# Patient Record
Sex: Female | Born: 1935 | Race: Black or African American | Hispanic: No | Marital: Married | Smoking: Former smoker
Health system: Southern US, Community
[De-identification: ages and names within clinical notes are randomized; demographics above are authoritative.]

## PROBLEM LIST (undated history)

## (undated) DIAGNOSIS — I2510 Atherosclerotic heart disease of native coronary artery without angina pectoris: Secondary | ICD-10-CM

## (undated) DIAGNOSIS — R9430 Abnormal result of cardiovascular function study, unspecified: Secondary | ICD-10-CM

## (undated) DIAGNOSIS — R42 Dizziness and giddiness: Secondary | ICD-10-CM

## (undated) DIAGNOSIS — I350 Nonrheumatic aortic (valve) stenosis: Secondary | ICD-10-CM

## (undated) DIAGNOSIS — Z9229 Personal history of other drug therapy: Secondary | ICD-10-CM

## (undated) DIAGNOSIS — IMO0002 Reserved for concepts with insufficient information to code with codable children: Secondary | ICD-10-CM

## (undated) DIAGNOSIS — M1990 Unspecified osteoarthritis, unspecified site: Secondary | ICD-10-CM

## (undated) DIAGNOSIS — E119 Type 2 diabetes mellitus without complications: Secondary | ICD-10-CM

## (undated) DIAGNOSIS — K219 Gastro-esophageal reflux disease without esophagitis: Secondary | ICD-10-CM

## (undated) DIAGNOSIS — E785 Hyperlipidemia, unspecified: Secondary | ICD-10-CM

## (undated) DIAGNOSIS — R208 Other disturbances of skin sensation: Secondary | ICD-10-CM

## (undated) DIAGNOSIS — I4891 Unspecified atrial fibrillation: Secondary | ICD-10-CM

## (undated) DIAGNOSIS — I10 Essential (primary) hypertension: Secondary | ICD-10-CM

## (undated) DIAGNOSIS — R55 Syncope and collapse: Secondary | ICD-10-CM

## (undated) HISTORY — DX: Abnormal result of cardiovascular function study, unspecified: R94.30

## (undated) HISTORY — PX: APPENDECTOMY: SHX54

## (undated) HISTORY — DX: Gastro-esophageal reflux disease without esophagitis: K21.9

## (undated) HISTORY — DX: Syncope and collapse: R55

## (undated) HISTORY — DX: Hyperlipidemia, unspecified: E78.5

## (undated) HISTORY — PX: EYE SURGERY: SHX253

## (undated) HISTORY — DX: Unspecified atrial fibrillation: I48.91

## (undated) HISTORY — DX: Dizziness and giddiness: R42

## (undated) HISTORY — PX: VAGINAL HYSTERECTOMY: SUR661

## (undated) HISTORY — DX: Personal history of other drug therapy: Z92.29

## (undated) HISTORY — PX: BREAST SURGERY: SHX581

## (undated) HISTORY — PX: SHOULDER SURGERY: SHX246

## (undated) HISTORY — DX: Nonrheumatic aortic (valve) stenosis: I35.0

## (undated) HISTORY — PX: OTHER SURGICAL HISTORY: SHX169

## (undated) HISTORY — DX: Other disturbances of skin sensation: R20.8

## (undated) HISTORY — DX: Reserved for concepts with insufficient information to code with codable children: IMO0002

## (undated) SURGERY — ECHOCARDIOGRAM, TRANSESOPHAGEAL
Anesthesia: Moderate Sedation

---

## 2005-05-02 ENCOUNTER — Ambulatory Visit: Payer: Self-pay | Admitting: Internal Medicine

## 2005-05-09 ENCOUNTER — Ambulatory Visit: Payer: Self-pay | Admitting: Internal Medicine

## 2005-05-09 ENCOUNTER — Ambulatory Visit (HOSPITAL_COMMUNITY): Admission: RE | Admit: 2005-05-09 | Discharge: 2005-05-09 | Payer: Self-pay | Admitting: Internal Medicine

## 2007-03-05 ENCOUNTER — Ambulatory Visit: Payer: Self-pay | Admitting: Cardiology

## 2007-03-08 ENCOUNTER — Inpatient Hospital Stay (HOSPITAL_COMMUNITY): Admission: AD | Admit: 2007-03-08 | Discharge: 2007-03-10 | Payer: Self-pay | Admitting: Internal Medicine

## 2007-03-08 ENCOUNTER — Ambulatory Visit: Payer: Self-pay | Admitting: Internal Medicine

## 2007-03-25 ENCOUNTER — Ambulatory Visit: Payer: Self-pay | Admitting: Cardiology

## 2010-09-19 ENCOUNTER — Ambulatory Visit: Admit: 2010-09-19 | Payer: Self-pay | Admitting: Orthopedic Surgery

## 2011-01-03 ENCOUNTER — Encounter: Payer: Self-pay | Admitting: Orthopedic Surgery

## 2011-01-03 ENCOUNTER — Ambulatory Visit (INDEPENDENT_AMBULATORY_CARE_PROVIDER_SITE_OTHER): Payer: Medicare HMO | Admitting: Orthopedic Surgery

## 2011-01-03 VITALS — HR 64 | Resp 16 | Ht 63.0 in | Wt 161.0 lb

## 2011-01-03 DIAGNOSIS — M179 Osteoarthritis of knee, unspecified: Secondary | ICD-10-CM

## 2011-01-03 DIAGNOSIS — IMO0002 Reserved for concepts with insufficient information to code with codable children: Secondary | ICD-10-CM

## 2011-01-03 DIAGNOSIS — M1710 Unilateral primary osteoarthritis, unspecified knee: Secondary | ICD-10-CM

## 2011-01-03 NOTE — Progress Notes (Signed)
Chief complaint bilateral knee pain, RIGHT greater than LEFT.   77 year old female with history of diabetes with no medication required, glaucoma, and hypertension status post appendectomy, RIGHT rotator cuff repair, bilateral breast surgery, and hysterectomy who presents as a patient of Dr. Marilynn Latino for evaluation of bilateral knee pain, RIGHT greater than LEFT.  She had a LEFT knee arthroscopy in 1996. She was followed in the evening by her orthopedist. LEFT hand. She's had increased pain for the last month associated with popping and stiffness. Previous treatments include injection, no arthritis medication, however.  She has gradually increasing throbbing 6/10 knee pain, occasionally in usually relieved with injection the last one coming in the LEFT knee in December of 2011 in the RIGHT knee and June of 2011.  She wishes to discuss knee replacement surgery.  All systems were reviewed and were negative, except for history of heartburn, and joint pain.  Other medical history includes allergy to cephalexin and sulfa and penicillin. She takes the medications as we have listed.  She has a family history of cancer and diabetes.  She is married. Does not smoke or drink and completed her education through the 11th grade.  Vital signs as reviewed.  General appearance her body habitus is endomorphic. She is oriented x3. Her mood and affect are normal.  She walks unsupported with no obvious length.  Her upper extremities have normal range of motion, stability, and strength. Normal skin. And no abnormalities on inspection.  The lower extremities. Hip range of motion is normal and pain-free. Knee flexion is approximately 120 on the RIGHT and 125 on the LEFT. Muscle tone normal in each. Both knees are stable. Both knees have some degree of medial joint line tenderness and lateral joint line tenderness. It appears to be more lateral than medial. Strength is normal. Skin is normal.  All 4 extremities have  normal pulse, and temperature, but no peripheral edema.  Lymph nodes are normal.  Sensation is normal.  Reflexes are 2+ and normal.  Balance and coordination are normal.  X-rays were changed from the patient. He showed that she has what is really developing valgus osteoarthritis of the knees.  We also reviewed her operative note from 1996. She had a abrasion arthroplasty. The patella and medial femoral condyle. Debridement of all 3 compartments. She had a lateral meniscectomy. Orthopedic notes are also noted backdating to 1992.  Impression bilateral knee osteoarthritis.  The RIGHT is worse than the LEFT.  The plan is for her to see her primary care doctor for medical clearance and then proceed with the RIGHT knee arthroplasty.  We did briefly review the surgery, a knee model, hospital stay. I asked her to read our literature from the [employer] on knee replacement, as well as our. Brochure. I asked her to go to the joint, class.  When she returns we will x-ray her knee 3 views, and then planned for surgical RIGHT knee total replacement.

## 2011-01-03 NOTE — Patient Instructions (Signed)
Get med clearance from Dr. Sherril Croon  After medical clearance give [place] a call and we will get you in for preop visit, will need xrays in our office at that time

## 2011-01-29 NOTE — Cardiovascular Report (Signed)
NAMEROCKIE, VAWTER                  ACCOUNT NO.:  [id number]   MEDICAL RECORD NO.:  [id number]          PATIENT TYPE:  INP   LOCATION:  2023                         FACILITY:  [facility]   PHYSICIAN:  Arturo Morton. Riley Kill, MD, FACCDATE OF BIRTH:  1931/05/04   DATE OF PROCEDURE:  03/09/2007  DATE OF DISCHARGE:                            CARDIAC CATHETERIZATION   INDICATIONS:  Ms. Bing Matter is a 77 year old female who presents with some  recurrent episodes of chest discomfort.  She had an abnormal Myoview.  Her enzymes were negative.  She was brought to the catheterization  laboratory for further evaluation.  Importantly, there is a vague  history possibly of contrast reaction.  She denies that she has ever had  a true contrast reaction.  She has had a soft shell fish, and had a  typical allergic type reaction to this.  However, she has also had  reactions to Solu-Medrol in the past.  As a result, we elected to give  her diphenhydramine as well as famotidine prior to the procedure, but  she lists methylprednisolone as an allergy and we elected to avoid this.  Risks, benefits, alternatives were discussed with the patient in detail.  Of note, the patient was given subcutaneous low level Lovenox  approximately four and a half hours prior to the procedure.  However, it  was in a dose of 0.6 mg/kg.   PROCEDURE:  1. Left the catheterization  2. Selective coronary arteriography.  3. Selective left ventriculography.   The patient was brought to the cath lab and prepped and draped in usual  fashion.  Intravenous famotidine, 50 mg of intravenous Benadryl were  administered prior to the procedure.  The patient did develop some  restless legs, typical of diphenhydramine.  The procedure was performed  for the right femoral artery.  We used only 4-French catheters.  Following the procedure, all catheters were subsequently removed and  hemostasis was achieved by direct hold by me and the technologist that  spanned more than 30 minutes with good hemostasis by 10 minutes.  She  was observed in the laboratory and then taken to the holding area for  continued convalescence.   HEMODYNAMIC DATA:  1. Initial central aortic pressure was 124/59, mean 82.  2. Left ventricular pressure 161/24.  3. There was no gradient on pullback across aortic valve.   ANGIOGRAPHIC DATA:  1. On plain fluoroscopy, there was fairly heavily calcification of the      coronary arteries.  There was also fairly heavy calcification of      the proximal aortic root.  2. Ventriculography in the RAO projection reveals preserved global      systolic function.  3. The left main is long and free of critical disease.  4. The LAD courses to the apex.  After the septal and diagonal is an      area of segmental disease in the mid vessel that is about 30%      luminal reduction.  It does not appear to be high-grade.  5. The circumflex provides mainly a bifurcating marginal branch other  than minor luminal irregularities without critical narrowing.  6. The right coronary artery is somewhat tortuous and provides      posterior descending and smaller posterolateral system.  There is      perhaps 30% tapered narrowing at the ostium but no areas of high-      grade disease.   CONCLUSIONS:  1. Well-preserved left ventricular function.  2. No evidence of high-grade coronary obstruction.   DISPOSITION:  The patient be treated medically.  She is diabetic and  does have advanced age.  Risk factor reduction is recommended.      Arturo Morton. Riley Kill, MD, [facility]  Electronically Signed     TDS/MEDQ  D:  03/09/2007  T:  03/10/2007  Job:  [id number]   cc:   Kirstie Peri, MD  Bevelyn Buckles. Bensimhon, MD  CV Laboratory

## 2011-01-29 NOTE — Assessment & Plan Note (Signed)
[facility]                          [facility] OFFICE NOTE   EUN, VERMEER                         MRN:          [id number]  DATE:03/25/2007                            DOB:          02/05/1931    SUMMARY OF HISTORY:  Ms. Kelli Stone is a 77 year old African-American female  who we initially consulted on during her [facility] admission.  She  underwent adenosine Myoview,  which showed an apical scar and some peri-  infarct ischemia with an EF of 51%.  It was felt that she should have a  cardiac catheterization to further evaluate this, thus she was  transferred to [facility]  At [facility] cardiac catheterization  was performed.  This revealed normal LV function, calcification of the  coronary arteries and of the proximal aortic root, the LAD had a mid 30%  luminal reduction, circumflex some irregularities and a 30% tapered  ostial RCA lesion.  MRI was also performed to evaluate the aortic root  and this did not show any abnormalities, thus she was discharged home.  She is here for followup.   Since her discharge she has not had any further problems with chest  discomfort.  She states that she has developed a slight knot at the  catheterization site and was wondering if this was normal.  She also has  multiple questions about the cardiac catheterization and she states that  no one discussed the results with her.   CURRENT MEDICATIONS:  Her current medications include:  1. Lisinopril 40 mg p.o. daily.  2. Metformin 500 mg p.o. daily.  3. Welchol 625 three tabs b.i.d.  4. Questran 4 mg daily.  5. MiraLax 17 gm daily.  6. Flonase 2 puffs daily.  7. Zetia 10 mg daily.  8. Felodipine 2.5 daily.  9. Nexium 40 daily.  10.Chlorthalidone 25 mg daily.  11.__________ eye drops 0.005% one drop O.U. daily.  12.Timoptic 0.25 one drop O.U. daily.  13.Calcium 600 plus D b.i.d.   She said that she was recently hospitalized overnight at [facility] for gastritis when she choked on taking her Welchol pills and  was discharged home on proton pump inhibitors.   PAST MEDICAL HISTORY:  Is notable for hypertension, diabetes,  hyperlipidemia, glaucoma, GERD, DJD, history of vertigo, rectal  bleeding, status post hemorrhoidectomy.   ALLERGIES:  Multiple drug allergies.   FAMILY HISTORY /SOCIAL HISTORY:  Remain unchanged from prior admission.   PHYSICAL EXAMINATION:  GENERAL:  Well-nourished, well-developed,  pleasant African-American female in no apparent distress.  Weight is 162.4, blood pressure 149/84, pulse 81.  HEENT:  Unremarkable.  NECK:  Supple without thyromegaly, adenopathy,  JVD or carotid bruits.  CHEST:  Symmetrical excursion.  LUNGS:  Clear to auscultation.  HEART:  PMI is not displaced, regular rate and rhythm. She does have a  2/6 systolic murmur, best appreciated at the left sternal border.  ABDOMEN:  Slightly rounded, bowel sounds present without organomegaly,  masses, or tenderness.  I did not appreciate any abdominal bruits.  EXTREMITIES:  Negative for cyanosis, clubbing or edema.  Pedal and  femoral pulses are 2+ bilaterally.  She does have bilateral soft  femoral bruits.  Catheterization site intact.  There is a very small  knot that is nontender without hematoma or ecchymosis.   IMPRESSION:  1. Noncardiac chest discomfort with recent cardiac catheterization      that shows preserved left ventricular function and nonobstructive      coronary artery disease as described above.  2. Hypertension.  3. Bilateral femoral bruits without evidence of claudication.  4. History as noted above.   DISPOSITION:  I spent some time with Ms. Olafson reviewing the findings of  her  abnormal adenosine Myoview at [facility] as well as her  cardiac catheterization  results.  I reassured her that her discomfort  was not cardiac in origin.  I also reviewed risk factors and continued  risk factor modification.  In  regards to her difficulty swallowing her  Welchol that was prescribed by Dr. Eliberto Ivory and her difficulties  swallowing, I suggested that she crush these pills or cut them in half.  If she continues to have problems she will follow up with Dr. Sherril Croon and  consider gastrointestinal evaluation.  I have encouraged a coated baby  aspirin given her risk factors and her nonobstructive coronary artery  disease.  She will try to take this and to see if she can tolerate this  from a gastrointestinal standpoint.  Given her recent findings we will  see her back on an as needed basis. She was encouraged to call us back  or have Dr. Sherril Croon contact us if she has any further difficulties.     Joellyn Rued, PA-C  Electronically Signed    EW/MedQ  DD: 03/25/2007  DT: 03/26/2007  Job #: [id number]   cc:   Doreen Beam

## 2011-01-29 NOTE — Discharge Summary (Signed)
Kelli Stone, Kelli Stone                  ACCOUNT NO.:  [id number]   MEDICAL RECORD NO.:  [id number]          PATIENT TYPE:  INP   LOCATION:  2023                         FACILITY:  [facility]   PHYSICIAN:  Arturo Morton. Riley Kill, MD, FACCDATE OF BIRTH:  [birth year]   DATE OF ADMISSION:  03/08/2007  DATE OF DISCHARGE:  03/10/2007                               DISCHARGE SUMMARY   REASON FOR ADMISSION:  Chest pain with abnormal Cardiolite study.   DISCHARGE DIAGNOSES:  1. Noncardiac chest pain, etiology unclear.  2. Minimal nonobstructive coronary disease by catheterization this      admission.  3. Good LV function.  4. Hypertension.  5. Diabetes mellitus.  6. Hyperlipidemia.  7. Glaucoma.  8. Gastroesophageal reflux disease.  9. Degenerative joint disease.  10.History of vertigo  11.History of rectal bleeding status post hemorrhoidectomy.  12.Preserved left ventricular systolic function.   ALLERGIES:  MULTIPLE ALLERGIES INCLUDING SULFA, ERYTHROMYCIN,  FLUOROQUINOLONES, CEPHALOSPORIN, DILTIAZEM, CODEINE, BUTORPHANOL,  METHYLPREDNISOLONE.  SHE HAS AN INTOLERANCE TO ASPIRIN AND NSAIDS.  APPARENTLY SHE HAS AN INTOLERANCE TO STATINS AS WELL.  THERE IS  QUESTIONABLE HISTORY OF IV DYE ALLERGY IN THE PAST.   PROCEDURES PERFORMED THIS ADMISSION:  Cardiac catheterization by Dr.  Shawnie Pons March 09, 2007.  Please see his dictated note for complete  details.   Briefly, 30% mid LAD stenosis and 30% ostial RCA stenosis.   HISTORY:  Ms. Kelli Stone is a 77 year old female patient who presented to the  [facility] emergency room on March 05, 2007, with sudden  onset of chest pain.  She described this as a lightning strike across  her chest while she was working on her deck outside with her husband.  She subsequently ruled out for myocardial infarction, set up for an  inpatient nuclear study.  This apparently returned revealing EF of 51%.  Scar localized at the apex with some periinfarct  ischemia.  She was kept  over the weekend and eventually transferred to [facility] on  March 08, 2007, for further evaluation to include cardiac  catheterization.   HOSPITAL COURSE:  The patient was accepted in transfer on March 14, 2007,  by Dr. Gala Romney.  She was pain free upon admission.  Aspirin was added  to her medical regimen, although she had listed this as an intolerance  in the past..  She was also switched from [facility] to Lipitor.  She went  for cardiac catheterization as noted above on March 09, 2007 by Dr.  Riley Kill.  The results are as noted above, and she tolerated the  procedure well and had no immediate complications.  Her D-dimer was  noted to be normal.  Apparently, the patient had some problems taking  Lipitor and Dr. Riley Kill decided to discontinue this on the date of  discharge.  He also discontinued her aspirin.  He had her resume her  Welchol.  She did have a heavily calcified aorta at her catheterization.  She was set up for a  MRA of the aortic root to rule out the possibility  of dissection.  This was eventually  performed after the patient received  p.o. Valium to cover for claustrophobia.  This revealed no evidence of  aortic aneurysm or aortic dissection and there were no acute findings.  Therefore, it was felt that the patient was stable enough for discharge  to home.   LABORATORY AND ANCILLARY DATA:  Prior to discharge, white count 5900,  hemoglobin 10.7, hematocrit 31.9, platelet count 307,000, INR 1.  Sodium  138, potassium 3.6, glucose 110, BUN 18, creatinine 1.17, calcium 8.9,  total cholesterol 180, triglycerides 153, HDL 28, LDL 121.  As noted  above, she ruled out for myocardial infarction by enzymes at [facility].  Chest x-ray at [facility] showed no acute  cardiopulmonary disease.   MRA of the chest as noted above.   DISCHARGE MEDICATIONS:  1. Welchol 625 mg 3 tablets b.i.d.  2. Vicodin 5/500 mg p.r.n.  3. Alprazolam 0.5  mg p.r.n.  4. Metformin 5 mg daily - to be resumed on March 11, 2007.  5. Lisinopril 40 mg daily.  6. Chlorthalidone 25 mg half tablet daily.  7. Xalatan eye drops 2 drops at bedtime.  8. Nexium 40 mg daily.  9. Timoptic eye drops daily.   DISCHARGE INSTRUCTIONS:  1. Diet: She is to maintain a low-fat, low-sodium diabetic diet.  2. Wound care: She has been provided an instruction sheet regarding      wound care.  She is to call for any groin swelling, bleeding or      bruising or fever.  3. Activity: She is to increase her activity slowly.  No lifting,      driving or sexual activity for 3 days.  She may walk up steps.  4. Followup: She is to follow up with her primary care physician, Dr.      Eduard Clos in [place].  She should call for an appointment.  5. She will follow up with the physician assistant at [facility] in [place] on March 19, 2007, at 8:30 in the morning.  She will see      Tereso Newcomer PA-C.  She had been provided with phone number and      address of our [place] office.  She has been instructed to bring her      medications to her appointments..   DISPOSITION:  She is discharged to home in satisfactory condition.   TOTAL PHYSICIAN/PA TIME:  Greater than thirty minutes.     Tereso Newcomer, PA-C      Arturo Morton. Riley Kill, MD, [facility]  Electronically Signed   SW/MEDQ  D:  03/10/2007  T:  03/11/2007  Job:  [id number]

## 2011-02-01 NOTE — Op Note (Signed)
NAME:  Kelli Stone, Kelli Stone                  ACCOUNT NO.:  [id number]   MEDICAL RECORD NO.:  [id number]          PATIENT TYPE:  AMB   LOCATION:  DAY                           FACILITY:  [facility]   PHYSICIAN:  Lionel December, M.D.    DATE OF BIRTH:  04-04-1931   DATE OF PROCEDURE:  05/09/2005  DATE OF DISCHARGE:                                 OPERATIVE REPORT   PROCEDURE:  Esophagogastroduodenoscopy followed by colonoscopy.   INDICATIONS:  Kelli Stone is a 77 year old Afro-American female with six months  history of intermittent upper abdominal pain. She also feels as if her food  does not digest. She has the frequent nausea and sporadic vomiting. She also  has intermittent hematochezia. She describes passing a moderate amount of  hematochezia, and she passes a moderate about of blood with some of her  bowel movements.   Procedure and risks were reviewed with the patient, and informed consent was  obtained.   PREMEDICATION:  Cetacaine spray for pharyngeal topical anesthesia, Demerol  50 mg IV, Versed 3 mg IV.   FINDINGS:  Procedure was performed in endoscopy suite. The patient's vital  signs and O2 saturation were monitored during the procedure remained stable.  The patient was placed in left lateral position. Procedure was performed in  endoscopy suite. The patient's vital signs and O2 saturation were monitored  during the procedure and remained stable.   PROCEDURE #1:  Esophagogastroduodenoscopy. The patient was placed in left  lateral position and Olympus videoscope was passed via oropharynx without  any difficulty into esophagus.   Esophagus. Mucosa of the esophagus normal. GE junction was at 34 cm and  hiatus at 36. She had small sliding hiatal hernia.   Stomach. It was empty and distended very well insufflation. Folds of  proximal stomach were normal. Examination of mucosa at body, antrum, pyloric  channel as well as angularis, fundus and cardia was normal.   Duodenum. Bulbar mucosa was  normal. Scope was passed to the second part of  duodenum where mucosa and folds were normal. Endoscope was withdrawn. The  patient prepared for procedure #2.   PROCEDURE #1:  Colonoscopy. Rectal examination performed. No abnormality  noted on external or digital exam. Olympus videoscope was placed in rectum  and advanced under vision into sigmoid colon which was tortuous with few  scattered diverticula. Preparation was satisfactory. The patient had to be  turned into supine position in order to pass the scope beyond junction of  descending and sigmoid colon. Further intubation of the cecum was easy.  Cecum was identified by ileocecal valve and appendiceal orifice/stump. As  the scope was withdrawn, colonic mucosa was carefully examined. There no  polyps and/or tumor masses. Rectal mucosa was normal. Scope was retroflexed  to examine anorectal junction, and hemorrhoids were noted below the dentate  line as well as erythema at the anorectal junction. Endoscope was  straightened and withdrawn. The patient tolerated the procedure well.   FINAL DIAGNOSIS:  1.  Small sliding hiatal hernia. Otherwise normal EGD.  2.  Sigmoid colon diverticulosis and external hemorrhoids. I suspect  recurrent hematochezia secondary to hemorrhoids.   RECOMMENDATIONS:  1.  She will continue anti-reflux measures and Nexium at 40 mg p.o. q.a.m.      CBC and LFTs will be drawn.  2.  Will schedule for upper abdominal ultrasound at [facility].  3.  Anusol-HC suppository per rectum at bedtime for 2 weeks.  4.  I would also like for to be on high-fiber diet and take FiberChoice 2      tablets q.d.      Lionel December, M.D.  Electronically Signed     NR/MEDQ  D:  05/09/2005  T:  05/09/2005  Job:  [id number]   cc:   Eliberto Ivory, M.D.

## 2011-02-01 NOTE — Consult Note (Signed)
NAME:  Kelli Stone, Kelli Stone                  ACCOUNT NO.:  [id number]   MEDICAL RECORD NO.:  [id number]          PATIENT TYPE:  AMB   LOCATION:                                FACILITY:  [facility]   PHYSICIAN:  Lionel December, M.D.    DATE OF BIRTH:  11-Jan-1931   DATE OF CONSULTATION:  05/02/2005  DATE OF DISCHARGE:                                   CONSULTATION   PRESENTING COMPLAINT:  Upper abdominal pain and rectal bleeding.   HISTORY OF PRESENT ILLNESS:  Kelli Stone is a 77 year old African-American female  who is referred through the courtesy of Dr. Eliberto Ivory for GI evaluation.  She  presents with at least a 6 month history of upper abdominal pain which is  described to be dull, intermittent pain which is worse after she has been  standing for a long time.  At times it is more pronounced in the left upper  quadrant.  Her pain seemed to be relieved when she leans forward.  She has  had frequent nausea and sporadic vomiting.  She also complains of food not  digesting.  She feels as if food sits in her stomach for a long time.  She  has a frequent heartburn more than 3 times a week.  She denies dysphagia.  She also complains of intermittent rectal bleeding.  When she has loose or  explosive bowel movements, she passes a significant amount of fresh blood  that colors the commode.  She has not had any melena.  She has lost 15  pounds in one year.  She has been on Nexium, which has helped her heartburn  but has not alleviated it completely.  She had initial colonoscopy by me in  July, 1995, which was normal.  She had other colonoscopy by Dr. Gabriel Cirri in  May, 2003 revealing internal hemorrhoids but no other abnormalities.   She had abdominopelvic CT by Dr. Eliberto Ivory on March 11, 2005.  She was noted to  have two hyperdense lesions in the liver.  One was in the posterior aspect  of the right lobe measuring 3 mm.  The other one is 5 mm in the left lobe.  Both of these are too small to characterize.  There is  prominence to  intrahepatic ducts, but CBD and CHD were normal.  No abnormality was noted  to gallbladder, pancreas, spleen, kidneys, etc.  She had fecal distention of  colon and diverticula involving the descending and sigmoid colon without  changes of diverticulitis.   Recent blood work to Dr. Magnus Ivan office includes MET-7 on February 14, 2005.  Her BUN was 10.  Creatinine 0.9.  Sodium 141, potassium 3.7.  Her TSH was  1.22.  Her WBC was 6.8.  H&H was 11.4 and 33.4.  Platelet count was 374 K.   The patient feels that she has had her upper abdominal discomfort and food  not digesting, etc. since she has been on Lescol.  Previously, she had been  on Lipitor.  She also experienced side effects with it.   MEDICATIONS:  1.  She is on  lisinopril 40 mg daily.  2.  Metformin ER 500 mg daily.  3.  Xanax 0.25 mg daily.  4.  Chlorthalidone 12.5 mg daily.  5.  Lescol XL 80 mg daily.  6.  Vicodin t.i.d. p.r.n.  7.  Glycolax 17 gm daily p.r.n.  8.  Nexium 40 mg q.a.m.  9.  Lactate p.r.n.  10. Beeno p.r.n.  11. Timolol and Xalatan eye drops to both eyes daily.  12. GI cocktail p.r.n.  13. Phazyme p.r.n.   PAST MEDICAL HISTORY:  1.  She has been hypertensive for 13-14 years.  2.  Diverticulosis, picked up on recent CT.  3.  She had a colonoscopy in 1995 and more recently in May, 2003 revealing      internal hemorrhoids but no other abnormalities.  4.  She has chronic GERD.  She has had symptoms for more than a year.  She      has never had an EGD.  5.  Bilateral glaucoma.  6.  Hyperlipidemia.  7.  She has been diabetic for about two years.  8.  She had an appendectomy and hysterectomy in the 1960s.  9.  She has had benign lesions removed from both her breasts.  10. She had left knee arthroscopy about 10 years ago.  She had repair of her      right rotator cuff tear in July, 2005.   ALLERGIES:  1.  CEPHALEXIN.  2.  SULFA.  3.  PENICILLIN.  4.  IODINE DYE.   FAMILY HISTORY:  Mother was  diabetic and died at age 48.  Father died of  bone cancer at age 68, as did one brother at age 49.  Another brother died  of intracranial hemorrhage due to aneurysm at age 63 or 72.  She has two  sisters living.  One sister died of unknown renal disease at age 29.   SOCIAL HISTORY:  She is married.  She has one child.  She is retired from  [employer].  She smoked a pack per day x20 years but quit 13-14 years  ago.  She does not drink alcohol.   PHYSICAL EXAMINATION:  VITAL SIGNS:  Weight 165-1/2 pounds.  Height 6 feet,  2 inches tall.  Pulse 80 per minute.  Blood pressure 142/70.  Temp 98.  GENERAL:  A pleasant, well-developed, mildly obese African-American female  who is in no acute distress.  HEENT:  Conjunctivae are pink.  Sclerae are anicteric.  Oropharyngeal mucosa  is normal.  She has a complete upper and partial lower dental plate.  NECK:  No neck masses or thyromegaly noted.  LUNGS:  Clear to auscultation.  HEART:  Regular rhythm.  Normal S1 and S2.  No murmur or gallop noted.  ABDOMEN:  Full.  Bowel sounds are normal.  On palpation, she has mid  epigastric tenderness but no organomegaly or masses noted.  RECTAL:  Deferred.  EXTREMITIES:  She does not have peripheral edema or clubbing.   LABS:  As above.   ASSESSMENT:  Kelli Stone is a 77 year old African-American female who has two  gastrointestinal issues:  Problem 1:  Recurrent upper abdominal pain with frequent heartburn as well  as food not digesting.  She has been on Nexium with partial response.  No  stones are picked up on the recent CT; however, cholesterol stones can be  missed on CT.  Her upper GI tract needs to be evaluated to make sure we are  not dealing with peptic ulcer disease  or chronic gastritis.  No pancreatic  disease seen on recent CT.  She could also simply have dyspepsia.  Lescol  appears to be making her symptoms worse and therefore needs to be stopped for a few weeks to see what happens.  Problem  2:  Intermittent hematochezia:  This could be hemorrhoidal.  The  amount of bleeding seems to be significant, and furthermore, she has mild  anemia.  Therefore, colonoscopy will be appropriate, as the last colonoscopy  was over three years ago.   RECOMMENDATIONS:  1.  Will check her CBC and LFTs at the time of EGD.  2.  She will discontinue her Lescol for now.  3.  EGD and colonoscopy to be performed at [facility] in the near future.  I have      reviewed the procedural risks with the patient, and she is agreeable.      She will have CBC and LFTs at that time.  For the time being, she will      continue Nexium at 40 mg q.a.m.  If EGD is entirely normal, we will      proceed with upper abdominal ultrasound with attention to gallbladder.   We would like to thank Dr. Eliberto Ivory for the opportunity to participate in the  care of this nice lady.      Lionel December, M.D.  Electronically Signed     NR/MEDQ  D:  05/02/2005  T:  05/02/2005  Job:  [id number]   cc:   Dr. Romeo Rabon, [facility]   [facility]  Fax: [phone]

## 2011-02-08 ENCOUNTER — Encounter: Payer: Self-pay | Admitting: Cardiovascular Disease

## 2011-02-08 ENCOUNTER — Telehealth: Payer: Self-pay | Admitting: *Deleted

## 2011-02-08 ENCOUNTER — Encounter: Payer: Self-pay | Admitting: *Deleted

## 2011-02-08 ENCOUNTER — Other Ambulatory Visit: Payer: Self-pay | Admitting: Cardiovascular Disease

## 2011-02-08 ENCOUNTER — Ambulatory Visit (INDEPENDENT_AMBULATORY_CARE_PROVIDER_SITE_OTHER): Payer: Medicare HMO | Admitting: Cardiovascular Disease

## 2011-02-08 DIAGNOSIS — R0602 Shortness of breath: Secondary | ICD-10-CM

## 2011-02-08 DIAGNOSIS — I350 Nonrheumatic aortic (valve) stenosis: Secondary | ICD-10-CM

## 2011-02-08 DIAGNOSIS — E7800 Pure hypercholesterolemia, unspecified: Secondary | ICD-10-CM

## 2011-02-08 DIAGNOSIS — I359 Nonrheumatic aortic valve disorder, unspecified: Secondary | ICD-10-CM

## 2011-02-08 DIAGNOSIS — I10 Essential (primary) hypertension: Secondary | ICD-10-CM | POA: Insufficient documentation

## 2011-02-08 DIAGNOSIS — R0600 Dyspnea, unspecified: Secondary | ICD-10-CM

## 2011-02-08 DIAGNOSIS — R0989 Other specified symptoms and signs involving the circulatory and respiratory systems: Secondary | ICD-10-CM

## 2011-02-08 DIAGNOSIS — R0609 Other forms of dyspnea: Secondary | ICD-10-CM

## 2011-02-08 NOTE — Assessment & Plan Note (Signed)
The patient has aortic stenosis which seems to be moderate and asymptomatic. I reviewed the report of her echocardiogram but the actual images are not available to review. Left ventricular systolic function was normal with an ejection fraction of 65%. The left atrium was mildly dilated. There was mild mitral regurgitation. The aortic valve was calcified with a mean gradient of 24.7 mmHg and a peak gradient of 49.5 mmHg. Calculated aortic valve area was 0.93. The aortic stenosis appears to be moderate also per her physical exam. The murmur is mid peaking and S2 is well preserved. The calculated aortic valve area is likely underestimated. At this time I recommend observation by serial echoes. We'll plan on obtaining an echocardiogram in 6-12 months.

## 2011-02-08 NOTE — Assessment & Plan Note (Signed)
The patient does have dyspnea with moderate activities which seems to have progressed over the last year especially with a decline in her functional capacity due to her severe osteoarthritis. Due to that and her multiple risk factors for coronary artery disease, I recommend further evaluation with a stress test before her planned knee replacement. Thus we'll proceed with pharmacological nuclear stress test for further evaluation.

## 2011-02-08 NOTE — Patient Instructions (Addendum)
   Lexiscan stress test  If the results of your test are normal or stable, you will receive a letter.  If they are abnormal, the nurse will contact you by phone.  Follow up after test above.

## 2011-02-08 NOTE — Progress Notes (Signed)
HPI  This is a 77 year old  African American female who is referred by Dr. Sherril Croon for evaluation of aortic stenosis which was discovered recently. The patient has no previous cardiac history. She was evaluated for preoperative medical evaluation prior to planned knee replacement for severe osteoarthritis. She was found to have a cardiac murmur and underwent an echocardiogram which showed evidence of aortic stenosis. The patient to denies any exertional chest pain. She does complain of exertional dyspnea with moderate activities but this has been chronic for many years and has not changed recently. She gets slightly dizzy upon standing up but there has been no syncope or presyncope. There is no previous history of coronary artery disease. No previous congestive heart failure. The patient's physical capacity is somewhat limited to do to have severe osteoarthritis. She does not engage in a regular exercise. She gets out of breath mostly going up the stairs. Sometimes she has to stop before she can continue going up one flight of stairs.  Allergies  Allergen Reactions  . Bonine     nervous  . Celebrex (Celecoxib)     Chest discomfort  . Cephalexin   . Depo-Medrol Itching    Darkening of the skin  . Dilacor Xr (Diltiazem Hcl)     Didn't disslove  . Erythromycin Nausea Only  . Lescol     aching  . Lotrel     aching  . Metformin And Related Diarrhea  . Naprosyn (Naproxen) Nausea Only  . Penicillins   . Stadol (Butorphanol Tartrate)     numb  . Sulfa Antibiotics   . Trinalin (Azatadine-Pseudoephedrine)     nervous     Current Outpatient Prescriptions on File Prior to Visit  Medication Sig Dispense Refill  . lisinopril (PRINIVIL,ZESTRIL) 40 MG tablet Take 40 mg by mouth daily.        . timolol (BETIMOL) 0.25 % ophthalmic solution Place 1 drop into both eyes every morning.       Marland Kitchen DISCONTD: AMLODIPINE BESYLATE PO Take by mouth.       . DISCONTD: omeprazole (PRILOSEC) 40 MG capsule Take 40 mg  by mouth daily.           Past Medical History  Diagnosis Date  . Glaucoma   . GERD (gastroesophageal reflux disease)   . Personal history of tobacco use, presenting hazards to health   . Personal history of allergy to penicillin   . Personal history of allergy to sulfonamides   . Vertigo     HISTORY OF  . Diabetes mellitus     diet controlled  . HTN (hypertension)   . Pure hypercholesterolemia      Past Surgical History  Procedure Date  . Appendectomy   . Right shoulder   . Shoulder surgery   . Breast surgery bilateral  . Vaginal hysterectomy      Family History  Problem Relation Age of Onset  . Cancer    . Diabetes       History   Social History  . Marital Status: Married    Spouse Name: N/A    Number of Children: N/A  . Years of Education: 11th grade   Occupational History  . Not on file.   Social History Main Topics  . Smoking status: Former Smoker -- 0.2 packs/day for 15 years    Types: Cigarettes    Quit date: 09/17/1983  . Smokeless tobacco: Never Used  . Alcohol Use: No  . Drug Use: Not on file  .  Sexually Active: Not on file   Other Topics Concern  . Not on file   Social History Narrative  . No narrative on file     ROS Constitutional: Negative for fever, chills, diaphoresis, activity change, appetite change and fatigue.  HENT: Negative for hearing loss, nosebleeds, congestion, sore throat, facial swelling, drooling, trouble swallowing, neck pain, voice change, sinus pressure and tinnitus.  Eyes: Negative for photophobia, pain, discharge and visual disturbance.  Respiratory: Negative for apnea, cough, chest tightness and wheezing.  Cardiovascular: Negative for chest pain, palpitations and leg swelling.  Gastrointestinal: Negative for nausea, vomiting, abdominal pain, diarrhea, constipation, blood in stool and abdominal distention.  Genitourinary: Negative for dysuria, urgency, frequency, hematuria and decreased urine volume.    Musculoskeletal: Negative for myalgias, back pain, joint swelling, arthralgias and gait problem.  Skin: Negative for color change, pallor, rash and wound.  Neurological: Negative for  tremors, seizures, syncope, speech difficulty, weakness,  numbness and headaches.  Psychiatric/Behavioral: Negative for suicidal ideas, hallucinations, behavioral problems and agitation. The patient is not nervous/anxious.     PHYSICAL EXAM   BP 150/74  Pulse 61  Ht 5\' 3"  (1.6 m)  Wt 159 lb (72.122 kg)  BMI 28.17 kg/m2 Constitutional: She is oriented to person, place, and time. She appears well-developed and well-nourished. No distress.  HENT: No nasal discharge.  Head: Normocephalic and atraumatic.  Eyes: Pupils are equal, round, and reactive to light. Right eye exhibits no discharge. Left eye exhibits no discharge.  Neck: Normal range of motion. Neck supple. No JVD present. No thyromegaly present.  Cardiovascular: Normal rate, regular rhythm, normal heart sounds and intact distal pulses. Exam reveals no gallop and no friction rub.  There is a 3/6 systolic ejection murmur in the aortic area which is mid peaking with preserved intensity of S2. The murmur radiates faintly to both carotid arteries.  Pulmonary/Chest: Effort normal and breath sounds normal. No stridor. No respiratory distress. She has no wheezes. She has no rales. She exhibits no tenderness.  Abdominal: Soft. Bowel sounds are normal. She exhibits no distension. There is no tenderness. There is no rebound and no guarding.  Musculoskeletal: Normal range of motion. She exhibits no edema and no tenderness.  Neurological: She is alert and oriented to person, place, and time. Coordination normal.  Skin: Skin is warm and dry. No rash noted. She is not diaphoretic. No erythema. No pallor.  Psychiatric: She has a normal mood and affect. Her behavior is normal. Judgment and thought content normal.     EKG: Sinus bradycardia with a heart rate of 55  beats per minute. Nonspecific T wave changes.  ASSESSMENT AND PLAN

## 2011-02-08 NOTE — Telephone Encounter (Signed)
LEXISCAN STRESS SET FOR 02-12-2010 @ [facility]

## 2011-02-08 NOTE — Assessment & Plan Note (Signed)
Consider treatment with a statin if her LDL is above 100. Occasionally treating this might slow down the progression of aortic stenosis although the data is conflicting.

## 2011-02-08 NOTE — Telephone Encounter (Signed)
AUTH # [id number] EXPIRES 03/09/11    Francine Graven) 02/13/11 LEXISCAN @ [facility]

## 2011-02-12 ENCOUNTER — Encounter: Payer: Self-pay | Admitting: Cardiovascular Disease

## 2011-02-13 DIAGNOSIS — R072 Precordial pain: Secondary | ICD-10-CM

## 2011-02-18 ENCOUNTER — Telehealth: Payer: Self-pay | Admitting: *Deleted

## 2011-02-18 NOTE — Telephone Encounter (Signed)
Left message to call back on voicemail regarding results and also need to ask who is doing knee surgery to send copy of results.

## 2011-02-18 NOTE — Telephone Encounter (Signed)
Message copied by Arlyss Gandy on Mon Feb 18, 2011  2:52 PM ------      Message from: Lorine Bears A      Created: Fri Feb 15, 2011  3:01 PM       Stress test came back normal. Inform patient.      The patient is at low-moderate risk for planned knee surgery.      Send results to Dr. Sherril Croon.

## 2011-02-19 NOTE — Telephone Encounter (Signed)
Pt left message on voicemail this morning stating that she was returning call and to leave a message if she wasn't there.  Left message to notify pt that stress test was normal. Asked pt to return call to notify who her surgeon is so that note can be sent to him/her.

## 2011-02-20 NOTE — Telephone Encounter (Signed)
Pt left message that Dr. Romeo Apple is her ortho surgeon. Notes faxed to him.

## 2011-03-06 ENCOUNTER — Ambulatory Visit: Payer: Medicare HMO | Admitting: Orthopedic Surgery

## 2011-04-09 ENCOUNTER — Encounter: Payer: Self-pay | Admitting: Cardiovascular Disease

## 2011-04-11 ENCOUNTER — Ambulatory Visit (INDEPENDENT_AMBULATORY_CARE_PROVIDER_SITE_OTHER): Payer: Medicare HMO | Admitting: Orthopedic Surgery

## 2011-04-11 DIAGNOSIS — M179 Osteoarthritis of knee, unspecified: Secondary | ICD-10-CM | POA: Insufficient documentation

## 2011-04-11 DIAGNOSIS — M1710 Unilateral primary osteoarthritis, unspecified knee: Secondary | ICD-10-CM

## 2011-04-11 DIAGNOSIS — IMO0002 Reserved for concepts with insufficient information to code with codable children: Secondary | ICD-10-CM

## 2011-04-11 NOTE — Progress Notes (Signed)
Discussed change date for surgery   Son and husband are ill   Advised to let us know when she is ready, purely elective procedure

## 2011-06-12 ENCOUNTER — Telehealth: Payer: Self-pay | Admitting: Orthopedic Surgery

## 2011-06-12 ENCOUNTER — Encounter: Payer: Self-pay | Admitting: Orthopedic Surgery

## 2011-06-12 ENCOUNTER — Ambulatory Visit (INDEPENDENT_AMBULATORY_CARE_PROVIDER_SITE_OTHER): Payer: Medicare HMO | Admitting: Orthopedic Surgery

## 2011-06-12 VITALS — Ht 63.0 in | Wt 161.0 lb

## 2011-06-12 DIAGNOSIS — IMO0002 Reserved for concepts with insufficient information to code with codable children: Secondary | ICD-10-CM

## 2011-06-12 DIAGNOSIS — M719 Bursopathy, unspecified: Secondary | ICD-10-CM

## 2011-06-12 DIAGNOSIS — M1710 Unilateral primary osteoarthritis, unspecified knee: Secondary | ICD-10-CM

## 2011-06-12 DIAGNOSIS — M179 Osteoarthritis of knee, unspecified: Secondary | ICD-10-CM

## 2011-06-12 DIAGNOSIS — M75102 Unspecified rotator cuff tear or rupture of left shoulder, not specified as traumatic: Secondary | ICD-10-CM

## 2011-06-12 DIAGNOSIS — M1711 Unilateral primary osteoarthritis, right knee: Secondary | ICD-10-CM

## 2011-06-12 DIAGNOSIS — M67919 Unspecified disorder of synovium and tendon, unspecified shoulder: Secondary | ICD-10-CM

## 2011-06-12 DIAGNOSIS — K5900 Constipation, unspecified: Secondary | ICD-10-CM | POA: Insufficient documentation

## 2011-06-12 MED ORDER — SENNOSIDES-DOCUSATE SODIUM 8.6-50 MG PO TABS: 2.0000 | ORAL_TABLET | Freq: Every day | ORAL | Status: DC | PRN

## 2011-06-12 MED ORDER — METHYLPREDNISOLONE ACETATE 40 MG/ML IJ SUSP: 40.0000 mg | Freq: Once | INTRAMUSCULAR | Status: DC

## 2011-06-12 MED ORDER — HYDROCODONE-ACETAMINOPHEN 5-500 MG PO TABS: 1.0000 | ORAL_TABLET | ORAL | Status: DC | PRN

## 2011-06-12 NOTE — Patient Instructions (Signed)
You have been scheduled for surgery.  All surgeries carry some risk.  Remember you always have the option of continued nonsurgical treatment. However in this situation the risks vs. the benefits favor surgery as the best treatment option. The risks of the surgery includes the following but is not limited to bleeding, infection, pulmonary embolus, death from anesthesia, nerve injury vascular injury or need for further surgery, continued pain.  Specific to this procedure the following risks and complications are rare but possible Stiffness, infection-requires 3 surgeries or more to get rid of  Pain   You have been scheduled for surgery  Please Go to your preoperative appointment and bring the folder that was given to you today  Please stop all blood thinners ibuprofen Naprosyn aspirin Plavix Coumadin

## 2011-06-12 NOTE — Telephone Encounter (Signed)
Contact to insurer [employer] at ph # [phone] re: in-patient surgery, right total knee replacement, CPT [phone], ICD9 716.96, 716.16, scheduled 07/01/11 at [facility].  Per Mayra Neer, clinical intake specialist, approved, with authorization # [id number].  States authorization based on medical necessity and subject to review at the time claim is received.

## 2011-06-12 NOTE — Progress Notes (Signed)
Chief complaint RIGHT knee pain  History 77 year old female with diabetes no medication required hypertension, status post appendectomy and RIGHT rotator cuff repair, status post bilateral breast surgery and hysterectomy who presents for preoperative evaluation for RIGHT knee replacement.  The patient has previously been counseled on RIGHT knee replacement and has read the academy handout.  She is ready for surgery.  She complains of severe RIGHT knee pain which interferes with her activities of daily living.  It is primarily lateral and associated with difficulties walking and getting up.  The pain is not radiating.  She denies any back pain.  She had a negative review of systems except for heartburn and LEFT shoulder pain.  I injected the LEFT shoulder today.  She has a family history of cancer and diabetes  She is married  She is ALLERGIC to penicillins and sulfa  Medications have been reviewed  She has an endomorphic body habitus.  She is oriented x3.  She has normal mood and affect.  She walks with no support.  Her RIGHT upper extremity has normal range of motion, stability and strength.  Skin is normal.  No tenderness.  The LEFT upper extremity shoulder joint is limited to 90 of abduction and flexion although it is stable.  She has weakness of the rotator cuff.  She is tender an anterolateral soft tissue and acromion.  No swelling.  The lower extremities have normal free range of motion in the hips without pain.  Her RIGHT knee flexion is 120 with normal muscle tone and trying to stability is normal she has lateral joint line tenderness.  The LEFT knee is normal  Pulses and temperature are normal in all 4 extremities.  She is no lymphadenopathy.  She has normal sensation.  She has normal reflexes and balance and coordination  Imaging  X-rays of the RIGHT knee show valgus arthritis minimal deformity  Impression osteoarthritis RIGHT knee Impression rotator cuff disease LEFT  shoulder  Plan RIGHT total knee replacement LEFT subacromial injection  Separate x-ray report 3 views RIGHT knee Pain RIGHT knee arthritis RIGHT knee Minimal valgus deformity is seen in the RIGHT knee with moderate joint space narrowing of the lateral compartment.  Impression osteoarthritis RIGHT knee  LEFT shoulder subacromial space  Consent was obtained.  Time out was taken   LEFT Shoulderwas injected with Depo-Medrol 40 mg plus lidocaine 1% 4 cc.  Shoulder was prepped with alcohol and anesthetized with ethyl chloride.  The injection was tolerated without complication.

## 2011-06-13 ENCOUNTER — Telehealth: Payer: Self-pay | Admitting: Orthopedic Surgery

## 2011-06-13 ENCOUNTER — Other Ambulatory Visit: Payer: Self-pay | Admitting: *Deleted

## 2011-06-13 DIAGNOSIS — K5900 Constipation, unspecified: Secondary | ICD-10-CM

## 2011-06-13 MED ORDER — SENNOSIDES-DOCUSATE SODIUM 8.6-50 MG PO TABS: 2.0000 | ORAL_TABLET | Freq: Every day | ORAL | Status: DC | PRN

## 2011-06-13 NOTE — Telephone Encounter (Signed)
re-faxed

## 2011-06-13 NOTE — Telephone Encounter (Signed)
Kelli Stone refaxed

## 2011-06-13 NOTE — Telephone Encounter (Signed)
Kelli Stone said her husband went to [facility] to pick up her medicine and they did not have anything for her

## 2011-06-25 ENCOUNTER — Other Ambulatory Visit: Payer: Self-pay

## 2011-06-25 ENCOUNTER — Encounter (HOSPITAL_COMMUNITY)
Admission: RE | Admit: 2011-06-25 | Discharge: 2011-06-25 | Disposition: A | Discharge status: Home / Self Care | Payer: Medicare HMO | Source: Ambulatory Visit | Attending: Orthopedic Surgery | Admitting: Orthopedic Surgery

## 2011-06-25 ENCOUNTER — Telehealth: Payer: Self-pay | Admitting: Cardiovascular Disease

## 2011-06-25 ENCOUNTER — Encounter (HOSPITAL_COMMUNITY): Payer: Self-pay

## 2011-06-25 LAB — BASIC METABOLIC PANEL
CO2: 27 mEq/L (ref 19–32)
Chloride: 102 mEq/L (ref 96–112)
Glucose, Bld: 138 mg/dL — ABNORMAL HIGH (ref 70–99)
Potassium: 4.2 mEq/L (ref 3.5–5.1)
Sodium: 138 mEq/L (ref 135–145)

## 2011-06-25 LAB — PROTIME-INR
INR: 1.07 (ref 0.00–1.49)
Prothrombin Time: 14.1 seconds (ref 11.6–15.2)

## 2011-06-25 LAB — DIFFERENTIAL
Lymphocytes Relative: 34 % (ref 12–46)
Lymphs Abs: 2.4 10*3/uL (ref 0.7–4.0)
Neutro Abs: 4 10*3/uL (ref 1.7–7.7)
Neutrophils Relative %: 55 % (ref 43–77)

## 2011-06-25 LAB — APTT: aPTT: 34 seconds (ref 24–37)

## 2011-06-25 LAB — ABO/RH: ABO/RH(D): O NEG

## 2011-06-25 LAB — CBC
MCV: 81.3 fL (ref 78.0–100.0)
Platelets: 352 10*3/uL (ref 150–400)
RBC: 4.16 MIL/uL (ref 3.87–5.11)
WBC: 7.1 10*3/uL (ref 4.0–10.5)

## 2011-06-25 LAB — SURGICAL PCR SCREEN
MRSA, PCR: NEGATIVE
Staphylococcus aureus: NEGATIVE

## 2011-06-25 MED ORDER — BUPIVACAINE 0.25 % ON-Q PUMP SINGLE CATH 300ML
300.0000 mL | INJECTION | Status: DC
  Filled 2011-06-25: qty 300

## 2011-06-25 NOTE — Patient Instructions (Addendum)
20 Kelli Stone  06/25/2011   Your procedure is scheduled on:  07/01/2011  Report to [facility] at  615  AM.  Call this number if you have problems the morning of surgery: [phone]   Remember:   Do not eat food:After Midnight.  Do not drink clear liquids: After Midnight.  Take these medicines the morning of surgery with A SIP OF WATER: vicodin,prilosec,norvasc,lisinopril   Do not wear jewelry, make-up or nail polish.  Do not wear lotions, powders, or perfumes. You may wear deodorant.  Do not shave 48 hours prior to surgery.  Do not bring valuables to the hospital.  Contacts, dentures or bridgework may not be worn into surgery.  Leave suitcase in the car. After surgery it may be brought to your room.  For patients admitted to the hospital, checkout time is 11:00 AM the day of discharge.   Patients discharged the day of surgery will not be allowed to drive home.  Name and phone number of your driver: family  Special Instructions: Incentive Spirometry - Practice and bring it with you on the day of surgery.   Please read over the following fact sheets that you were given: Pain Booklet, Coughing and Deep Breathing, Total Joint Packet, MRSA Information, Surgical Site Infection Prevention, Anesthesia Post-op Instructions and Care and Recovery After Surgery PATIENT INSTRUCTIONS POST-ANESTHESIA  IMMEDIATELY FOLLOWING SURGERY:  Do not drive or operate machinery for the first twenty four hours after surgery.  Do not make any important decisions for twenty four hours after surgery or while taking narcotic pain medications or sedatives.  If you develop intractable nausea and vomiting or a severe headache please notify your doctor immediately.  FOLLOW-UP:  Please make an appointment with your surgeon as instructed. You do not need to follow up with anesthesia unless specifically instructed to do so.  WOUND CARE INSTRUCTIONS (if applicable):  Keep a dry clean dressing on the anesthesia/puncture wound  site if there is drainage.  Once the wound has quit draining you may leave it open to air.  Generally you should leave the bandage intact for twenty four hours unless there is drainage.  If the epidural site drains for more than 36-48 hours please call the anesthesia department.  QUESTIONS?:  Please feel free to call your physician or the hospital operator if you have any questions, and they will be happy to assist you.     [facility] Anesthesia Department [address] [place] [phone]

## 2011-06-25 NOTE — Telephone Encounter (Signed)
Kelli Stone/[facility] called about Kelli Stone.  She is coming for her preop appointment today, and Kelli Stone noticed that you did not order an antibiotic for Kelli Stone.  She is allergic to Penicillin. Scheduled for total knee 07/01/11

## 2011-07-01 ENCOUNTER — Encounter (HOSPITAL_COMMUNITY): Payer: Self-pay | Admitting: *Deleted

## 2011-07-01 ENCOUNTER — Encounter (HOSPITAL_COMMUNITY): Payer: Self-pay | Admitting: Anesthesiology

## 2011-07-01 ENCOUNTER — Inpatient Hospital Stay (HOSPITAL_COMMUNITY): Payer: Medicare HMO

## 2011-07-01 ENCOUNTER — Inpatient Hospital Stay (HOSPITAL_COMMUNITY)
Admission: RE | Admit: 2011-07-01 | Discharge: 2011-07-04 | DRG: 470 | Disposition: A | Discharge status: Home Health | Payer: Medicare HMO | Source: Ambulatory Visit | Attending: Orthopedic Surgery | Admitting: Orthopedic Surgery

## 2011-07-01 ENCOUNTER — Inpatient Hospital Stay (HOSPITAL_COMMUNITY): Payer: Medicare HMO | Admitting: Anesthesiology

## 2011-07-01 ENCOUNTER — Encounter (HOSPITAL_COMMUNITY)
Admission: RE | Disposition: A | Discharge status: Home Health | Payer: Self-pay | Source: Ambulatory Visit | Attending: Orthopedic Surgery

## 2011-07-01 DIAGNOSIS — M1711 Unilateral primary osteoarthritis, right knee: Secondary | ICD-10-CM

## 2011-07-01 DIAGNOSIS — Z01812 Encounter for preprocedural laboratory examination: Secondary | ICD-10-CM

## 2011-07-01 DIAGNOSIS — Z01810 Encounter for preprocedural cardiovascular examination: Secondary | ICD-10-CM

## 2011-07-01 DIAGNOSIS — I10 Essential (primary) hypertension: Secondary | ICD-10-CM | POA: Diagnosis present

## 2011-07-01 DIAGNOSIS — D62 Acute posthemorrhagic anemia: Secondary | ICD-10-CM | POA: Diagnosis not present

## 2011-07-01 DIAGNOSIS — M1710 Unilateral primary osteoarthritis, unspecified knee: Secondary | ICD-10-CM

## 2011-07-01 DIAGNOSIS — E119 Type 2 diabetes mellitus without complications: Secondary | ICD-10-CM | POA: Diagnosis present

## 2011-07-01 DIAGNOSIS — K219 Gastro-esophageal reflux disease without esophagitis: Secondary | ICD-10-CM | POA: Diagnosis present

## 2011-07-01 DIAGNOSIS — IMO0002 Reserved for concepts with insufficient information to code with codable children: Principal | ICD-10-CM | POA: Diagnosis present

## 2011-07-01 HISTORY — PX: TOTAL KNEE ARTHROPLASTY: SHX125

## 2011-07-01 LAB — GLUCOSE, CAPILLARY: Glucose-Capillary: 102 mg/dL — ABNORMAL HIGH (ref 70–99)

## 2011-07-01 SURGERY — ARTHROPLASTY, KNEE, TOTAL
Anesthesia: Spinal | Site: Knee | Laterality: Right | Wound class: Clean

## 2011-07-01 MED ORDER — BISACODYL 5 MG PO TBEC
10.0000 mg | DELAYED_RELEASE_TABLET | Freq: Every day | ORAL | Status: DC
  Administered 2011-07-01 – 2011-07-04 (×4): 10 mg via ORAL
  Filled 2011-07-01 (×2): qty 1
  Filled 2011-07-01 (×3): qty 2

## 2011-07-01 MED ORDER — LATANOPROST 0.005 % OP SOLN
1.0000 [drp] | Freq: Every day | OPHTHALMIC | Status: DC
  Administered 2011-07-01 – 2011-07-03 (×3): 1 [drp] via OPHTHALMIC
  Filled 2011-07-01: qty 2.5

## 2011-07-01 MED ORDER — ONDANSETRON HCL 4 MG/2ML IJ SOLN
4.0000 mg | Freq: Four times a day (QID) | INTRAMUSCULAR | Status: DC | PRN
  Administered 2011-07-01: 4 mg via INTRAVENOUS
  Filled 2011-07-01: qty 2

## 2011-07-01 MED ORDER — VANCOMYCIN HCL IN DEXTROSE 1-5 GM/200ML-% IV SOLN: 1000.0000 mg | Freq: Once | INTRAVENOUS | Status: DC

## 2011-07-01 MED ORDER — BUPIVACAINE-EPINEPHRINE PF 0.5-1:200000 % IJ SOLN
INTRAMUSCULAR | Status: AC
  Filled 2011-07-01: qty 20

## 2011-07-01 MED ORDER — METOCLOPRAMIDE HCL 10 MG PO TABS
5.0000 mg | ORAL_TABLET | Freq: Three times a day (TID) | ORAL | Status: DC | PRN
Start: 2011-07-01 — End: 2011-07-04

## 2011-07-01 MED ORDER — ACETAMINOPHEN 650 MG RE SUPP: 650.0000 mg | Freq: Four times a day (QID) | RECTAL | Status: DC | PRN

## 2011-07-01 MED ORDER — ONDANSETRON HCL 4 MG/2ML IJ SOLN
4.0000 mg | Freq: Once | INTRAMUSCULAR | Status: AC
  Administered 2011-07-01: 4 mg via INTRAVENOUS

## 2011-07-01 MED ORDER — MAGNESIUM HYDROXIDE 400 MG/5ML PO SUSP: 30.0000 mL | Freq: Two times a day (BID) | ORAL | Status: DC | PRN

## 2011-07-01 MED ORDER — VANCOMYCIN HCL IN DEXTROSE 1-5 GM/200ML-% IV SOLN
INTRAVENOUS | Status: AC
  Filled 2011-07-01: qty 200

## 2011-07-01 MED ORDER — EPHEDRINE SULFATE 50 MG/ML IJ SOLN
INTRAMUSCULAR | Status: AC
  Filled 2011-07-01: qty 1

## 2011-07-01 MED ORDER — MENTHOL 3 MG MT LOZG
1.0000 | LOZENGE | OROMUCOSAL | Status: DC | PRN
  Filled 2011-07-01: qty 9

## 2011-07-01 MED ORDER — METHOCARBAMOL 100 MG/ML IJ SOLN
500.0000 mg | Freq: Once | INTRAVENOUS | Status: AC
  Administered 2011-07-01: 500 mg via INTRAVENOUS
  Filled 2011-07-01: qty 5

## 2011-07-01 MED ORDER — MIDAZOLAM HCL 2 MG/2ML IJ SOLN
INTRAMUSCULAR | Status: AC
  Administered 2011-07-01: 2 mg via INTRAVENOUS
  Filled 2011-07-01: qty 2

## 2011-07-01 MED ORDER — SODIUM CHLORIDE 0.9 % IR SOLN
Status: DC | PRN
  Administered 2011-07-01: 1000 mL

## 2011-07-01 MED ORDER — DEXTROSE 5 % IV SOLN
500.0000 mg | Freq: Four times a day (QID) | INTRAVENOUS | Status: DC | PRN
  Filled 2011-07-01: qty 5

## 2011-07-01 MED ORDER — ACETAMINOPHEN 325 MG PO TABS: 650.0000 mg | ORAL_TABLET | Freq: Four times a day (QID) | ORAL | Status: DC | PRN

## 2011-07-01 MED ORDER — BUPIVACAINE HCL 0.75 % IJ SOLN
INTRAMUSCULAR | Status: DC | PRN
  Administered 2011-07-01: 1.75 mL

## 2011-07-01 MED ORDER — METHOCARBAMOL 500 MG PO TABS: 500.0000 mg | ORAL_TABLET | Freq: Four times a day (QID) | ORAL | Status: DC | PRN

## 2011-07-01 MED ORDER — KCL-LACTATED RINGERS 20 MEQ/L IV SOLN
INTRAVENOUS | Status: DC
  Administered 2011-07-01 – 2011-07-03 (×6): via INTRAVENOUS
  Filled 2011-07-01 (×6): qty 1000

## 2011-07-01 MED ORDER — BUPIVACAINE ON-Q PAIN PUMP (FOR ORDER SET NO CHG)
INJECTION | Status: AC
  Filled 2011-07-01: qty 1

## 2011-07-01 MED ORDER — FENTANYL CITRATE 0.05 MG/ML IJ SOLN
INTRAMUSCULAR | Status: DC | PRN
  Administered 2011-07-01: 50 ug via INTRAVENOUS
  Administered 2011-07-01: 20 ug via INTRAVENOUS
  Administered 2011-07-01: 25 ug via INTRAVENOUS

## 2011-07-01 MED ORDER — LACTATED RINGERS IV SOLN
INTRAVENOUS | Status: DC
  Administered 2011-07-01: 1000 mL via INTRAVENOUS

## 2011-07-01 MED ORDER — FLEET ENEMA 7-19 GM/118ML RE ENEM: 1.0000 | ENEMA | Freq: Every day | RECTAL | Status: DC | PRN

## 2011-07-01 MED ORDER — ONDANSETRON HCL 4 MG/2ML IJ SOLN: 4.0000 mg | Freq: Once | INTRAMUSCULAR | Status: DC | PRN

## 2011-07-01 MED ORDER — PROPOFOL 10 MG/ML IV EMUL
INTRAVENOUS | Status: AC
  Filled 2011-07-01: qty 20

## 2011-07-01 MED ORDER — FENTANYL CITRATE 0.05 MG/ML IJ SOLN
25.0000 ug | INTRAMUSCULAR | Status: DC | PRN
  Administered 2011-07-01 (×4): 50 ug via INTRAVENOUS

## 2011-07-01 MED ORDER — BUPIVACAINE HCL (PF) 0.25 % IJ SOLN
INTRAMUSCULAR | Status: AC
  Filled 2011-07-01: qty 270

## 2011-07-01 MED ORDER — ACETAMINOPHEN 500 MG PO TABS
500.0000 mg | ORAL_TABLET | Freq: Once | ORAL | Status: AC
  Administered 2011-07-01: 500 mg via ORAL

## 2011-07-01 MED ORDER — DOCUSATE SODIUM 100 MG PO CAPS
100.0000 mg | ORAL_CAPSULE | Freq: Two times a day (BID) | ORAL | Status: DC
  Administered 2011-07-01 – 2011-07-04 (×7): 100 mg via ORAL
  Filled 2011-07-01 (×7): qty 1

## 2011-07-01 MED ORDER — PHENOL 1.4 % MT LIQD: 1.0000 | OROMUCOSAL | Status: DC | PRN

## 2011-07-01 MED ORDER — HYDROCODONE-ACETAMINOPHEN 5-325 MG PO TABS
1.0000 | ORAL_TABLET | ORAL | Status: DC
  Administered 2011-07-01 (×2): 2 via ORAL
  Administered 2011-07-01: 1 via ORAL
  Administered 2011-07-02 – 2011-07-03 (×10): 2 via ORAL
  Administered 2011-07-03 (×2): 1 via ORAL
  Administered 2011-07-04 (×3): 2 via ORAL
  Filled 2011-07-01 (×6): qty 2
  Filled 2011-07-01 (×2): qty 1
  Filled 2011-07-01 (×6): qty 2
  Filled 2011-07-01: qty 1
  Filled 2011-07-01 (×4): qty 2

## 2011-07-01 MED ORDER — POLYETHYLENE GLYCOL 3350 17 G PO PACK: 17.0000 g | PACK | Freq: Every day | ORAL | Status: DC | PRN

## 2011-07-01 MED ORDER — ACETAMINOPHEN 500 MG PO TABS
ORAL_TABLET | ORAL | Status: AC
  Administered 2011-07-01: 500 mg via ORAL
  Filled 2011-07-01: qty 1

## 2011-07-01 MED ORDER — TIMOLOL MALEATE 0.25 % OP SOLN
1.0000 [drp] | OPHTHALMIC | Status: DC
  Administered 2011-07-02 – 2011-07-04 (×2): 1 [drp] via OPHTHALMIC
  Filled 2011-07-01: qty 5

## 2011-07-01 MED ORDER — BUPIVACAINE 0.25 % ON-Q PUMP SINGLE CATH 300ML
INJECTION | Status: DC | PRN
  Administered 2011-07-01: 300 mL

## 2011-07-01 MED ORDER — DIPHENHYDRAMINE HCL 12.5 MG/5ML PO ELIX: 12.5000 mg | ORAL_SOLUTION | ORAL | Status: DC | PRN

## 2011-07-01 MED ORDER — ASPIRIN EC 325 MG PO TBEC
325.0000 mg | DELAYED_RELEASE_TABLET | Freq: Two times a day (BID) | ORAL | Status: DC
  Administered 2011-07-02 – 2011-07-04 (×5): 325 mg via ORAL
  Filled 2011-07-01 (×5): qty 1

## 2011-07-01 MED ORDER — MIDAZOLAM HCL 5 MG/5ML IJ SOLN
INTRAMUSCULAR | Status: DC | PRN
  Administered 2011-07-01: 2 mg via INTRAVENOUS
  Administered 2011-07-01 (×2): 1 mg via INTRAVENOUS

## 2011-07-01 MED ORDER — BISACODYL 10 MG RE SUPP: 10.0000 mg | Freq: Every day | RECTAL | Status: DC

## 2011-07-01 MED ORDER — VANCOMYCIN HCL IN DEXTROSE 1-5 GM/200ML-% IV SOLN
1000.0000 mg | Freq: Two times a day (BID) | INTRAVENOUS | Status: AC
  Administered 2011-07-01: 1000 mg via INTRAVENOUS
  Filled 2011-07-01: qty 200

## 2011-07-01 MED ORDER — BUPIVACAINE-EPINEPHRINE 0.5% -1:200000 IJ SOLN
INTRAMUSCULAR | Status: DC | PRN
  Administered 2011-07-01: 60 mL

## 2011-07-01 MED ORDER — METOCLOPRAMIDE HCL 5 MG/ML IJ SOLN
5.0000 mg | Freq: Three times a day (TID) | INTRAMUSCULAR | Status: DC | PRN
  Administered 2011-07-01: 10 mg via INTRAVENOUS
  Filled 2011-07-01: qty 2

## 2011-07-01 MED ORDER — MIDAZOLAM HCL 2 MG/2ML IJ SOLN
INTRAMUSCULAR | Status: AC
  Filled 2011-07-01: qty 2

## 2011-07-01 MED ORDER — HYDROMORPHONE HCL 1 MG/ML IJ SOLN
0.5000 mg | INTRAMUSCULAR | Status: DC | PRN
  Administered 2011-07-01 (×2): 0.5 mg via INTRAVENOUS
  Filled 2011-07-01 (×2): qty 1

## 2011-07-01 MED ORDER — FERROUS FUMARATE 325 (106 FE) MG PO TABS
1.0000 | ORAL_TABLET | Freq: Every day | ORAL | Status: DC
  Administered 2011-07-01: 106 mg via ORAL
  Administered 2011-07-02 – 2011-07-03 (×2): 324 mg via ORAL
  Administered 2011-07-04: 106 mg via ORAL
  Filled 2011-07-01 (×4): qty 1

## 2011-07-01 MED ORDER — ONDANSETRON HCL 4 MG PO TABS: 4.0000 mg | ORAL_TABLET | Freq: Four times a day (QID) | ORAL | Status: DC | PRN

## 2011-07-01 MED ORDER — AMLODIPINE BESYLATE 5 MG PO TABS
5.0000 mg | ORAL_TABLET | Freq: Every day | ORAL | Status: DC
  Administered 2011-07-01 – 2011-07-04 (×4): 5 mg via ORAL
  Filled 2011-07-01 (×4): qty 1

## 2011-07-01 MED ORDER — HYDROCODONE-ACETAMINOPHEN 5-325 MG PO TABS
ORAL_TABLET | ORAL | Status: AC
  Administered 2011-07-01: 1 via ORAL
  Filled 2011-07-01: qty 1

## 2011-07-01 MED ORDER — PANTOPRAZOLE SODIUM 40 MG PO TBEC
40.0000 mg | DELAYED_RELEASE_TABLET | Freq: Every day | ORAL | Status: DC
  Administered 2011-07-01 – 2011-07-04 (×4): 40 mg via ORAL
  Filled 2011-07-01 (×4): qty 1

## 2011-07-01 MED ORDER — FENTANYL CITRATE 0.05 MG/ML IJ SOLN
INTRAMUSCULAR | Status: AC
  Administered 2011-07-01: 50 ug via INTRAVENOUS
  Filled 2011-07-01: qty 2

## 2011-07-01 MED ORDER — BUPIVACAINE IN DEXTROSE 0.75-8.25 % IT SOLN
INTRATHECAL | Status: AC
  Filled 2011-07-01: qty 2

## 2011-07-01 MED ORDER — ALUM & MAG HYDROXIDE-SIMETH 200-200-20 MG/5ML PO SUSP: 15.0000 mL | ORAL | Status: DC | PRN

## 2011-07-01 MED ORDER — ONDANSETRON HCL 4 MG/2ML IJ SOLN
INTRAMUSCULAR | Status: AC
  Administered 2011-07-01: 4 mg via INTRAVENOUS
  Filled 2011-07-01: qty 2

## 2011-07-01 MED ORDER — EPHEDRINE SULFATE 50 MG/ML IJ SOLN
INTRAMUSCULAR | Status: DC | PRN
  Administered 2011-07-01: 5 mg via INTRAVENOUS

## 2011-07-01 MED ORDER — HYDROCODONE-ACETAMINOPHEN 5-325 MG PO TABS
1.0000 | ORAL_TABLET | Freq: Once | ORAL | Status: AC
  Administered 2011-07-01: 1 via ORAL

## 2011-07-01 MED ORDER — LISINOPRIL 10 MG PO TABS
40.0000 mg | ORAL_TABLET | Freq: Every day | ORAL | Status: DC
  Administered 2011-07-01 – 2011-07-04 (×4): 40 mg via ORAL
  Filled 2011-07-01 (×4): qty 4

## 2011-07-01 MED ORDER — ALUM & MAG HYDROXIDE-SIMETH 200-200-20 MG/5ML PO SUSP: 30.0000 mL | ORAL | Status: DC | PRN

## 2011-07-01 MED ORDER — PROPOFOL 10 MG/ML IV EMUL
INTRAVENOUS | Status: DC | PRN
  Administered 2011-07-01: 20 ug/kg/min via INTRAVENOUS
  Administered 2011-07-01: 10 ug/kg/min via INTRAVENOUS

## 2011-07-01 MED ORDER — MIDAZOLAM HCL 2 MG/2ML IJ SOLN
1.0000 mg | INTRAMUSCULAR | Status: DC | PRN
  Administered 2011-07-01: 2 mg via INTRAVENOUS

## 2011-07-01 MED ORDER — SODIUM CHLORIDE 0.9 % IR SOLN
Status: DC | PRN
  Administered 2011-07-01: 3000 mL

## 2011-07-01 SURGICAL SUPPLY — 77 items
BAG HAMPER (MISCELLANEOUS) ×2 IMPLANT
BANDAGE ELASTIC 4 VELCRO NS (GAUZE/BANDAGES/DRESSINGS) ×2 IMPLANT
BANDAGE ELASTIC 6 VELCRO NS (GAUZE/BANDAGES/DRESSINGS) ×2 IMPLANT
BANDAGE ESMARK 6X9 LF (GAUZE/BANDAGES/DRESSINGS) ×1 IMPLANT
BIT DRILL 3.2X128 (BIT) IMPLANT
BLADE HEX COATED 2.75 (ELECTRODE) ×2 IMPLANT
BLADE SAG 18X100X1.27 (BLADE) ×2 IMPLANT
BLADE SAGITTAL 25.0X1.27X90 (BLADE) ×2 IMPLANT
BLADE SAW SAG 90X13X1.27 (BLADE) ×2 IMPLANT
BLADE SURG SZ10 CARB STEEL (BLADE) ×2 IMPLANT
BNDG ESMARK 6X9 LF (GAUZE/BANDAGES/DRESSINGS) ×2
BOWL SMART MIX CTS (DISPOSABLE) IMPLANT
CATH KIT ON Q 2.5IN SLV (PAIN MANAGEMENT) ×2 IMPLANT
CEMENT HV SMART SET (Cement) ×4 IMPLANT
CLOTH BEACON ORANGE TIMEOUT ST (SAFETY) ×2 IMPLANT
COOLER CRYO CUFF IC AND MOTOR (MISCELLANEOUS) ×2 IMPLANT
COVER LIGHT HANDLE STERIS (MISCELLANEOUS) ×4 IMPLANT
COVER PROBE W GEL 5X96 (DRAPES) ×2 IMPLANT
CUFF CRYO KNEE LG 20X31 COOLER (ORTHOPEDIC SUPPLIES) IMPLANT
CUFF CRYO KNEE18X23 MED (MISCELLANEOUS) ×2 IMPLANT
CUFF TOURNIQUET SINGLE 34IN LL (TOURNIQUET CUFF) ×2 IMPLANT
CUFF TOURNIQUET SINGLE 44IN (TOURNIQUET CUFF) IMPLANT
DECANTER SPIKE VIAL GLASS SM (MISCELLANEOUS) ×2 IMPLANT
DRAPE BACK TABLE (DRAPES) ×2 IMPLANT
DRAPE EXTREMITY T 121X128X90 (DRAPE) ×2 IMPLANT
DRAPE INCISE IOBAN 44X35 STRL (DRAPES) ×4 IMPLANT
DRAPE U-SHAPE 47X51 STRL (DRAPES) ×2 IMPLANT
DRESSING ALLEVYN BORDER HEEL (GAUZE/BANDAGES/DRESSINGS) IMPLANT
DRSG MEPILEX BORDER 4X12 (GAUZE/BANDAGES/DRESSINGS) ×2 IMPLANT
DURAPREP 26ML APPLICATOR (WOUND CARE) ×2 IMPLANT
ELECT REM PT RETURN 9FT ADLT (ELECTROSURGICAL) ×2
ELECTRODE REM PT RTRN 9FT ADLT (ELECTROSURGICAL) ×1 IMPLANT
FACESHIELD LNG OPTICON STERILE (SAFETY) ×2 IMPLANT
GLOVE BIO SURGEON STRL SZ7 (GLOVE) ×2 IMPLANT
GLOVE BIOGEL M 7.0 STRL (GLOVE) ×2 IMPLANT
GLOVE BIOGEL PI IND STRL 7.0 (GLOVE) ×2 IMPLANT
GLOVE BIOGEL PI IND STRL 7.5 (GLOVE) ×1 IMPLANT
GLOVE BIOGEL PI IND STRL 8 (GLOVE) ×1 IMPLANT
GLOVE BIOGEL PI INDICATOR 7.0 (GLOVE) ×2
GLOVE BIOGEL PI INDICATOR 7.5 (GLOVE) ×1
GLOVE BIOGEL PI INDICATOR 8 (GLOVE) ×1
GLOVE ECLIPSE 7.0 STRL STRAW (GLOVE) ×2 IMPLANT
GLOVE EXAM NITRILE MD LF STRL (GLOVE) ×4 IMPLANT
GLOVE OPTIFIT SS 8.0 STRL (GLOVE) ×4 IMPLANT
GLOVE SKINSENSE NS SZ8.0 LF (GLOVE) ×1
GLOVE SKINSENSE STRL SZ8.0 LF (GLOVE) ×1 IMPLANT
GLOVE SS N UNI LF 8.5 STRL (GLOVE) ×2 IMPLANT
GOWN BRE IMP SLV AUR XL STRL (GOWN DISPOSABLE) ×6 IMPLANT
GOWN STRL REIN XL XLG (GOWN DISPOSABLE) ×4 IMPLANT
HANDPIECE INTERPULSE COAX TIP (DISPOSABLE) ×1
HOOD W/PEELAWAY (MISCELLANEOUS) ×10 IMPLANT
INST SET MAJOR BONE (KITS) ×2 IMPLANT
IV NS IRRIG 3000ML ARTHROMATIC (IV SOLUTION) ×2 IMPLANT
KIT BLADEGUARD II DBL (SET/KITS/TRAYS/PACK) ×2 IMPLANT
KIT ROOM TURNOVER APOR (KITS) ×2 IMPLANT
MANIFOLD NEPTUNE II (INSTRUMENTS) ×2 IMPLANT
MARKER SKIN DUAL TIP RULER LAB (MISCELLANEOUS) ×2 IMPLANT
NEEDLE HYPO 21X1.5 SAFETY (NEEDLE) ×2 IMPLANT
NS IRRIG 1000ML POUR BTL (IV SOLUTION) ×2 IMPLANT
PACK TOTAL JOINT (CUSTOM PROCEDURE TRAY) ×2 IMPLANT
PAD ARMBOARD 7.5X6 YLW CONV (MISCELLANEOUS) ×2 IMPLANT
PAD DANNIFLEX CPM (ORTHOPEDIC SUPPLIES) ×2 IMPLANT
PIN TROCAR 3 INCH (PIN) IMPLANT
SET BASIN LINEN APH (SET/KITS/TRAYS/PACK) ×2 IMPLANT
SET HNDPC FAN SPRY TIP SCT (DISPOSABLE) ×1 IMPLANT
SPONGE GAUZE 4X4 12PLY (GAUZE/BANDAGES/DRESSINGS) IMPLANT
STAPLER VISISTAT 35W (STAPLE) ×2 IMPLANT
SUT BRALON NAB BRD #1 30IN (SUTURE) ×6 IMPLANT
SUT MON AB 0 CT1 (SUTURE) ×4 IMPLANT
SUT MON AB 2-0 CT1 36 (SUTURE) ×4 IMPLANT
SYR 30ML LL (SYRINGE) ×2 IMPLANT
SYR BULB IRRIGATION 50ML (SYRINGE) ×2 IMPLANT
TOWEL OR 17X26 4PK STRL BLUE (TOWEL DISPOSABLE) ×2 IMPLANT
TOWER CARTRIDGE SMART MIX (DISPOSABLE) ×2 IMPLANT
TRAY FOLEY CATH 14FR (SET/KITS/TRAYS/PACK) ×2 IMPLANT
WATER STERILE IRR 1000ML POUR (IV SOLUTION) ×4 IMPLANT
YANKAUER SUCT 12FT TUBE ARGYLE (SUCTIONS) ×4 IMPLANT

## 2011-07-01 NOTE — Anesthesia Procedure Notes (Signed)
Spinal Block  Patient location during procedure: OR Start time: 07/01/2011 7:51 AM Staffing CRNA/Resident: Glynn Octave Performed by: resident/CRNA  Preanesthetic Checklist Completed: patient identified, site marked, surgical consent, pre-op evaluation, timeout performed, IV checked, risks and benefits discussed and monitors and equipment checked Spinal Block Patient position: right lateral decubitus Prep: Betadine Patient monitoring: heart rate, cardiac monitor, continuous pulse ox and blood pressure Approach: right paramedian Location: L4-5 Injection technique: single-shot Needle Needle type: Spinocan  Needle gauge: 22 G Needle length: 9 cm Assessment Sensory level: T8 Additional Notes 1051- Marcaoine, .75%, 1.75cc with .1cc of 1:1000 epi and fentanyl injected.  CSF aspiration pre and post injection. Tray #[id number], exp. Date 04/2012

## 2011-07-01 NOTE — Anesthesia Preprocedure Evaluation (Addendum)
Anesthesia Evaluation  Name, MR# and DOB Patient awake  General Assessment Comment  Reviewed: Allergy & Precautions, H&P , NPO status , Patient's Chart, lab work & pertinent test results  History of Anesthesia Complications Negative for: history of anesthetic complications  Airway Mallampati: III TM Distance: <3 FB Neck ROM: Full    Dental  (+) Edentulous Upper and Partial Lower   Pulmonary (+) shortness of breath and With exertion former smoker clear to auscultation        Cardiovascular hypertension, Pt. on medications + Valvular Problems/Murmurs (mod, EF 60%, neg stress test) AS Regular + Systolic murmurs    Neuro/Psych    GI/Hepatic GERD Medicated and Controlled  Endo/Other  Diabetes mellitus-, Well Controlled, Type 2  Renal/GU      Musculoskeletal   Abdominal   Peds  Hematology   Anesthesia Other Findings   Reproductive/Obstetrics                           Anesthesia Physical Anesthesia Plan  ASA: III  Anesthesia Plan: Spinal   Post-op Pain Management:    Induction:   Airway Management Planned: Nasal Cannula  Additional Equipment:   Intra-op Plan:   Post-operative Plan:   Informed Consent: I have reviewed the patients History and Physical, chart, labs and discussed the procedure including the risks, benefits and alternatives for the proposed anesthesia with the patient or authorized representative who has indicated his/her understanding and acceptance.     Plan Discussed with:   Anesthesia Plan Comments:         Anesthesia Quick Evaluation

## 2011-07-01 NOTE — Brief Op Note (Signed)
07/01/2011  9:44 AM  PATIENT:  Kelli Stone  77 y.o. female  PRE-OPERATIVE DIAGNOSIS:  osteoarthritis right knee  POST-OPERATIVE DIAGNOSIS:  osteoarthritis right knee  PROCEDURE:  Procedure(s):RIGHT TOTAL KNEE ARTHROPLASTY  DEPUY  57F  3 T 12.5 INSERT 35 PATELLA   SURGEON:  Surgeon(s): Fuller Canada, MD  PHYSICIAN ASSISTANT:   ASSISTANTS: WAYNE MCFATTER  AND JUDY BURROUGHS   ANESTHESIA:   spinal  EBL: NONE  Total I/O In: 800 [I.V.:800] Out: 175 [Urine:125; Blood:50]  BLOOD ADMINISTERED:none  DRAINS: PAIN PUMP CATHETER   LOCAL MEDICATIONS USED:  MARCAINE WITH EPI .50% 60 CC  SPECIMEN:  No Specimen  DISPOSITION OF SPECIMEN:  N/A  COUNTS:  YES  TOURNIQUET:   Total Tourniquet Time Documented: Thigh (Right) - 73 minutes  DICTATION: .Reubin Milan Dictation  PLAN OF CARE: Admit to inpatient   PATIENT DISPOSITION:  PACU - hemodynamically stable.   Delay start of Pharmacological VTE agent (>24hrs) due to surgical blood loss or risk of bleeding:  yes

## 2011-07-01 NOTE — Anesthesia Postprocedure Evaluation (Addendum)
  Anesthesia Post-op Note  Patient: Kelli Stone  Procedure(s) Performed:  TOTAL KNEE ARTHROPLASTY - Depuy  Patient Location: PACU  Anesthesia Type: Spinal  Level of Consciousness: awake, alert  and oriented  Airway and Oxygen Therapy: Patient Spontanous Breathing  Post-op Pain: none  Post-op Assessment: Post-op Vital signs reviewed, Patient's Cardiovascular Status Stable, Respiratory Function Stable and No signs of Nausea or vomiting  Post-op Vital Signs: Reviewed and stable  Complications: No apparent anesthesia complications 07/02/11  Patient is alert and oriented.  VSS  Sensation returned to normal, denies HA or back pain.  No apparent anesthesia complications.

## 2011-07-01 NOTE — Interval H&P Note (Signed)
History and Physical Interval Note:   07/01/2011   7:19 AM   Kelli Stone  has presented today for surgery, with the diagnosis of osteoarthritis right knee  The various methods of treatment have been discussed with the patient and family. After consideration of risks, benefits and other options for treatment, the patient has consented to  Procedure(s):RIGHT  TOTAL KNEE ARTHROPLASTY as a surgical intervention .  I have reviewed the patients' chart and labs.  Questions were answered to the patient's satisfaction.    H&P guidelines Update  The H/P was reviewed, the patient was examined, and there is no change in the patient's condition since the original H/P was completed.  Per Joint commission requirements   Terrance Mass., MD   Fuller Canada  MD

## 2011-07-01 NOTE — Op Note (Signed)
PROCEDURE RIGHT TKA DEPUY 69F   3T   12.5 INSERT  35P  DOS 07/01/2011  Preop diagnosis osteoarthritis RIGHT  knee Postop diagnosis same Procedure RIGHT  total knee arthroplasty Surgeon Romeo Apple Assisted by [facility] AND JUDY BURROUGHS Anesthesia spinal Findings LATERAL COMPARTMENT GRADE 4 DISEASE, PREOP PASSIVE FLEXION 125  POST 125  Tourniquet time 73 minutes, pressure 300 mm of mercury  Indications for procedure disabling knee pain, failure to control pain with nonoperative measures  Details of procedure:  In the preop area the patient's RIGHT  knee was marked and countersigned by the surgeon, the chart was updated, consent was signed  The patient was taken to the operating room for spinal anesthetic followed by administering 1 GRAM OF VANCO DUE TO PCN ALLERGY A Foley catheter was inserted sterilely, then the operative extremity  was prepped and draped sterilely  The timeout was completed  The limb was then exsanguinated with a six-inch Esmarch with the knee in flexion and the tourniquet was elevated to 300 mm of mercury. A midline incision was made, the subcutaneous tissues were divided down to the extensor mechanism. A medial arthrotomy was performed, the patella was everted,  the fat pad was resected. The medial and  lateral menisci were resected. The medial soft tissue sleeve was elevated to the mid coronal plane. The anterior cruciate ligament and PCL were resected. Osteophytes were removed. The distal femur anterior surface was skeletonized with sharp dissection.  A three-eighths inch drill bit was used to enter the femoral canal which was suctioned and irrigated until the fluid was clear;  an intramedullary rod was placed in the femur with a 5 RESECTION setting, the block was pinned in place; then an 10mm distal femoral resection was performed. The cut was checked for flatness.  The sizing guide was then placed on the femur, the femur measured a size 2; the block was  pinned in external rotation 3 using the epicondyles as reference; a  4-in-1 cutting block was placed and the 4 cuts were made with retractors protecting the collateral ligaments. The posterior osteophytes were removed with a curved osteotome. Residual PCL tissue was resected; residual meniscal tissue was resected.  The external tibial alignment instrument was set for tibial resection. The guide was  placed referencing the medial side which was the UN worn side and the stylus was set at 8 mm resection. Anterior slope was built in to match the patients anatomy; a neutral varus valgus cut was set using the medial third of the tibial tubercle as reference along with the medial portion of the lateral tibial spine. The guide was stabilized with pins.  A saw was used to resect the anterior tibia. The tibia was sized to a size 3 base plate.  We then placed spacer blocks using a  10 and a 12.5 spacer block;  the knee was balanced  in extension and was balanced in flexion with the 12.5 spacer block   The box cut was then done using the box cutting guide. We then turned our attention to the patella.  The patella measured 22 mm we set the guide to leave 15 mm of patella.  After resection of the patella,  It was remeasured, and measured 15 mm. The size was 35. We then drilled the 3 peg holes.  We then did a trial reduction. The trial reduction was excellent with full extension, balance in extension, balance in flexion. Passive flexion equalled 125, patella normal tracking.   We then  punched the tibia per technique.   The bone was then irrigated and dried while cement was mixed on the back table. The implants were checked for accuracy and then cemented in place; excess cement was removed; the cement was allowed to cure. The wound was then irrigated with copious amounts of saline, the posterior capsule was injected with 30 cc of Marcaine with epinephrine followed by 30 cc in the soft tissue. The 12.5 insert was placed.  Range of motion matched trial reduction  The capsule was closed with #1 Bralon in interrupted and running fashion and then the joint was injected with 30 cc of Marcaine with epinephrine.  The subcutaneous pain pump was placed.  The subcutaneous tissues were closed with  0 Monocryl and 2-0 Monocryl in running fashion  Staples were used to reapproximate the skin edges.  Sterile dressings were applied. A radiograph was obtained. Cryo/Cuff was placed and activated.  The patient was then taken to the recovery room in stable condition.  Routine postop plan for knee replacement.

## 2011-07-01 NOTE — H&P (Signed)
  Chief complaint bilateral knee pain, RIGHT greater than LEFT.  77 year old female with history of diabetes with no medication required, glaucoma, and hypertension status post appendectomy, RIGHT rotator cuff repair, bilateral breast surgery, and hysterectomy who presents as a patient of Dr. Marilynn Latino for evaluation of bilateral knee pain, RIGHT greater than LEFT.  She had a LEFT knee arthroscopy in 1996. She was followed in the evening by her orthopedist. LEFT hand. She's had increased pain for the last month associated with popping and stiffness. Previous treatments include injection, no arthritis medication, however.  She has gradually increasing throbbing 6/10 knee pain, occasionally in usually relieved with injection the last one coming in the LEFT knee in December of 2011 in the RIGHT knee and June of 2011.  She wishes to discuss knee replacement surgery.  All systems were reviewed and were negative, except for history of heartburn, and joint pain.  Other medical history includes allergy to cephalexin and sulfa and penicillin. She takes the medications as we have listed.  She has a family history of cancer and diabetes.  She is married. Does not smoke or drink and completed her education through the 11th grade.  Vital signs as reviewed.  General appearance her body habitus is endomorphic. She is oriented x3. Her mood and affect are normal.  She walks unsupported with no obvious length.  Her upper extremities have normal range of motion, stability, and strength. Normal skin. And no abnormalities on inspection.  The lower extremities. Hip range of motion is normal and pain-free. Knee flexion is approximately 120 on the RIGHT and 125 on the LEFT. Muscle tone normal in each. Both knees are stable. Both knees have some degree of medial joint line tenderness and lateral joint line tenderness. It appears to be more lateral than medial. Strength is normal. Skin is normal.  All 4 extremities have normal pulse,  and temperature, but no peripheral edema.  Lymph nodes are normal.  Sensation is normal.  Reflexes are 2+ and normal.  Balance and coordination are normal.  X-rays, showed that she has what is really developing valgus osteoarthritis of the knees.  We also reviewed her operative note from 1996. She had a abrasion arthroplasty. The patella and medial femoral condyle. Debridement of all 3 compartments. She had a lateral meniscectomy. Orthopedic notes are also noted backdating to 1992.  Impression right knee valgus OA   PLAN:  RIGHT knee arthroplasty

## 2011-07-01 NOTE — Transfer of Care (Signed)
Immediate Anesthesia Transfer of Care Note  Patient: Kelli Stone  Procedure(s) Performed:  TOTAL KNEE ARTHROPLASTY - Depuy  Patient Location: PACU  Anesthesia Type: Spinal  Level of Consciousness: awake, alert  and oriented  Airway & Oxygen Therapy: Patient Spontanous Breathing and Patient connected to nasal cannula oxygen  Post-op Assessment: Report given to PACU RN  Post vital signs: Reviewed and stable  Complications: No apparent anesthesia complications

## 2011-07-02 LAB — CBC
HCT: 27.1 % — ABNORMAL LOW (ref 36.0–46.0)
MCH: 27.8 pg (ref 26.0–34.0)
MCV: 80.2 fL (ref 78.0–100.0)
RBC: 3.38 MIL/uL — ABNORMAL LOW (ref 3.87–5.11)
RDW: 13.6 % (ref 11.5–15.5)
WBC: 10 10*3/uL (ref 4.0–10.5)

## 2011-07-02 LAB — GLUCOSE, CAPILLARY
Glucose-Capillary: 155 mg/dL — ABNORMAL HIGH (ref 70–99)
Glucose-Capillary: 132 mg/dL — ABNORMAL HIGH (ref 70–99)
Glucose-Capillary: 113 mg/dL — ABNORMAL HIGH (ref 70–99)

## 2011-07-02 LAB — BASIC METABOLIC PANEL
BUN: 10 mg/dL (ref 6–23)
CO2: 26 mEq/L (ref 19–32)
Calcium: 8.9 mg/dL (ref 8.4–10.5)
Chloride: 101 mEq/L (ref 96–112)
Creatinine, Ser: 0.74 mg/dL (ref 0.50–1.10)

## 2011-07-02 MED ORDER — OXYCODONE HCL 5 MG PO TABS
5.0000 mg | ORAL_TABLET | Freq: Once | ORAL | Status: AC
  Administered 2011-07-02: 5 mg via ORAL
  Filled 2011-07-02: qty 1

## 2011-07-02 MED ORDER — VANCOMYCIN HCL 1000 MG IV SOLR
1000.0000 mg | INTRAVENOUS | Status: DC | PRN
  Administered 2011-07-01: 1 g via INTRAVENOUS

## 2011-07-02 NOTE — Addendum Note (Signed)
Addendum  created 07/02/11 0943 by Glynn Octave   Modules edited:Anesthesia Medication Administration

## 2011-07-02 NOTE — Progress Notes (Signed)
Physical Therapy Treatment Patient Details Name: Kelli Stone MRN: [id number] DOB: 08-13-1931 Today's Date: 07/02/2011  PT Assessment/Plan  PT - Assessment/Plan Comments on Treatment Session: ROM R knee is -8 to 70 deg, AA...increased gait today...progressing well...still plans to return home at d/c PT Plan: Discharge plan remains appropriate PT Frequency: Min 6X/week Follow Up Recommendations: Home health PT Equipment Recommended: Rolling walker with 5" wheels;3 in 1 bedside comode PT Goals  Acute Rehab PT Goals PT Goal Formulation: With patient Pt will go Sit to Supine/Side: with supervision PT Goal: Sit to Supine/Side - Progress: Progressing toward goal Pt will Transfer Sit to Stand/Stand to Sit: with supervision PT Transfer Goal: Sit to Stand/Stand to Sit - Progress: Progressing toward goal Pt will Ambulate: 16 - 50 feet;with rolling walker;with supervision PT Goal: Ambulate - Progress: Progressing toward goal Pt will Go Up / Down Stairs: 3-5 stairs;with min assist;with least restrictive assistive device  PT Treatment Precautions/Restrictions  Precautions Precautions: Knee Required Braces or Orthoses: No Restrictions Weight Bearing Restrictions: No Mobility (including Balance) Bed Mobility Bed Mobility: Yes Right Sidelying to Sit: 4: Min assist Sitting - Scoot to Edge of Bed: 4: Min assist Sit to Supine - Right: 4: Min assist Sit to Supine - Right Details (indicate cue type and reason): assist to move RLE into bed Transfers Transfers: Yes Sit to Stand: 3: Mod assist Stand to Sit: 4: Min assist Ambulation/Gait Ambulation/Gait: Yes Ambulation/Gait Assistance: 4: Min assist Ambulation Distance (Feet): 15 Feet Assistive device: Rolling walker Gait Pattern: Decreased step length - right;Decreased step length - left;Decreased stance time - right;Antalgic Stairs: No Wheelchair Mobility Wheelchair Mobility: No  Posture/Postural Control Posture/Postural Control: No  significant limitations Balance Balance Assessed: No Exercise  Total Joint Exercises Ankle Circles/Pumps: AROM;Both;10 reps;Supine Quad Sets: AROM;Both;10 reps;Supine Short Arc Quad: AAROM;Right;Supine;10 reps Heel Slides: AAROM;Right;5 reps;Supine Knee Flexion: AAROM;Right;5 reps;Seated End of Session PT - End of Session Equipment Utilized During Treatment: Gait belt Activity Tolerance: Patient tolerated treatment well Patient left: in bed;in CPM;with call bell in reach;with family/visitor present Nurse Communication: Mobility status for transfers General Behavior During Session: [facility] for tasks performed Cognition: [facility] for tasks performed  Konrad Penta 07/02/2011, 2:26 PM

## 2011-07-02 NOTE — Progress Notes (Signed)
Subjective: 1 Day Post-Op Procedure(s) (LRB): TOTAL KNEE ARTHROPLASTY (Right) Patient reports pain as moderate.    Objective: Vital signs in last 24 hours: Temp:  [96.3 F (35.7 C)-98.8 F (37.1 C)] 98.8 F (37.1 C) (10/16 0518) Pulse Rate:  [61-95] 75  (10/16 0518) Resp:  [12-30] 20  (10/16 0518) BP: (122-189)/(50-94) 175/80 mmHg (10/16 0518) SpO2:  [91 %-100 %] 100 % (10/16 0518) Weight:  [73 kg (160 lb 15 oz)] 160 lb 15 oz (73 kg) (10/15 1404)  Intake/Output from previous day: 10/15 0701 - 10/16 0700 In: 3036.7 [P.O.:120; I.V.:2916.7] Out: 1725 [Urine:1675; Blood:50] Intake/Output this shift:     Basename 07/02/11 0535  HGB 9.4*    Basename 07/02/11 0535  WBC 10.0  RBC 3.38*  HCT 27.1*  PLT 270    Basename 07/02/11 0535  NA 134*  K 3.9  CL 101  CO2 26  BUN 10  CREATININE 0.74  GLUCOSE 171*  CALCIUM 8.9   No results found for this basename: LABPT:2,INR:2 in the last 72 hours  Neurologically intact Neurovascular intact Sensation intact distally Intact pulses distally Dorsiflexion/Plantar flexion intact Compartment soft  Assessment/Plan: 1 Day Post-Op Procedure(s) (LRB): TOTAL KNEE ARTHROPLASTY (Right) Advance diet Up with therapy  Fuller Canada 07/02/2011, 7:42 AM

## 2011-07-02 NOTE — Progress Notes (Signed)
Encounter addended by: Glynn Octave on: 07/02/2011  8:30 AM<BR>     Documentation filed: Notes Section

## 2011-07-02 NOTE — Progress Notes (Signed)
Physical Therapy Evaluation Patient Details Name: Kelli Stone MRN: [id number] DOB: 09/24/1930 Today's Date: 07/02/2011  Problem List:  Patient Active Problem List  Diagnoses  . HTN (hypertension)  . Pure hypercholesterolemia  . Aortic stenosis  . Dyspnea  . OA (osteoarthritis) of knee  . Arthritis of knee, right  . Constipated    Past Medical History:  Past Medical History  Diagnosis Date  . Glaucoma   . GERD (gastroesophageal reflux disease)   . Personal history of tobacco use, presenting hazards to health   . Personal history of allergy to penicillin   . Personal history of allergy to sulfonamides   . Vertigo     HISTORY OF  . Diabetes mellitus     diet controlled  . HTN (hypertension)   . Pure hypercholesterolemia    Past Surgical History:  Past Surgical History  Procedure Date  . Appendectomy   . Right shoulder   . Shoulder surgery   . Breast surgery bilateral  . Vaginal hysterectomy     PT Assessment/Plan/Recommendation PT Assessment Clinical Impression Statement: very pleasant pt with good family support...has difficulty getting pain under control, but ROM R knee = -8 to 80 deg AA..major hindrance to return home at discharge is that main living area of home requires climbing 6 steps...she plans to go home at discharge, so will probably meed ambulance transportation...should progress well with discharge PT Recommendation/Assessment: Patient will need skilled PT in the acute care venue PT Problem List: Decreased strength;Decreased range of motion;Decreased activity tolerance;Decreased mobility;Decreased knowledge of use of DME;Decreased knowledge of precautions;Pain Barriers to Discharge: Inaccessible home environment PT Therapy Diagnosis : Difficulty walking;Abnormality of gait;Acute pain PT Plan PT Frequency: Min 6X/week PT Treatment/Interventions: DME instruction;Gait training;Stair training;Functional mobility training;Therapeutic exercise;Patient/family  education PT Recommendation Recommendations for Other Services: OT consult Follow Up Recommendations: Home health PT Equipment Recommended: Rolling walker with 5" wheels;3 in 1 bedside comode PT Goals  Acute Rehab PT Goals PT Goal Formulation: With patient Pt will go Sit to Supine/Side: with supervision Pt will Transfer Sit to Stand/Stand to Sit: with supervision Pt will Ambulate: 16 - 50 feet;with rolling walker;with supervision Pt will Go Up / Down Stairs: 3-5 stairs;with min assist;with least restrictive assistive device  PT Evaluation Precautions/Restrictions  Precautions Precautions: Knee Required Braces or Orthoses: No Restrictions Weight Bearing Restrictions: No Prior Functioning  Home Living Lives With: Spouse Receives Help From: Family Type of Home: House Home Layout: Multi-level;Bed/bath upstairs Alternate Level Stairs-Rails: Right Alternate Level Stairs-Number of Steps: 6 Home Access: Level entry Bathroom Shower/Tub: Engineer, manufacturing systems: Standard Bathroom Accessibility: Yes How Accessible: Accessible via walker Home Adaptive Equipment: None Prior Function Level of Independence: Independent with basic ADLs Driving: Yes Cognition Cognition Arousal/Alertness: Awake/alert Overall Cognitive Status: Appears within functional limits for tasks assessed Orientation Level: Oriented X4 Sensation/Coordination Sensation Light Touch: Appears Intact Proprioception: Appears Intact Extremity Assessment RUE Assessment RUE Assessment: Within Functional Limits LUE Assessment LUE Assessment: Within Functional Limits RLE Assessment RLE Assessment: Exceptions to [facility] RLE PROM (degrees) Right Knee Extension 0-130: -8  Right Knee Flexion 0-140: 80  LLE Assessment LLE Assessment: Within Functional Limits Mobility (including Balance) Bed Mobility Bed Mobility: Yes Right Sidelying to Sit: 4: Min assist Sitting - Scoot to Edge of Bed: 4: Min  assist Transfers Transfers: Yes Sit to Stand: 3: Mod assist;With upper extremity assist;From bed Stand to Sit: 4: Min assist;To chair/3-in-1;With upper extremity assist Ambulation/Gait Ambulation/Gait: Yes Ambulation/Gait Assistance: 4: Min assist Ambulation Distance (Feet): 3 Feet Assistive  device: Rolling walker Gait Pattern: Decreased step length - right;Decreased step length - left;Decreased stance time - right;Antalgic Stairs: No Wheelchair Mobility Wheelchair Mobility: No  Posture/Postural Control Posture/Postural Control: No significant limitations Balance Balance Assessed: No Exercise  Total Joint Exercises Ankle Circles/Pumps: AROM;Both;10 reps;Supine Quad Sets: AROM;Both;10 reps;Supine Short Arc Quad: AAROM;Right;10 reps;Supine Heel Slides: AAROM;Right;10 reps;Supine Knee Flexion: AAROM;Right;5 reps;Seated End of Session PT - End of Session Equipment Utilized During Treatment: Gait belt Activity Tolerance: Patient tolerated treatment well;Patient limited by pain Patient left: in chair;with call bell in reach;with family/visitor present Nurse Communication: Mobility status for transfers;Mobility status for ambulation General Behavior During Session: [facility] for tasks performed Cognition: [facility] for tasks performed  Konrad Penta 07/02/2011, 11:19 AM

## 2011-07-03 LAB — BASIC METABOLIC PANEL
BUN: 22
CO2: 24
Calcium: 8.9
GFR calc Af Amer: 57 — ABNORMAL LOW
GFR calc non Af Amer: 47 — ABNORMAL LOW
Glucose, Bld: 110 — ABNORMAL HIGH
Potassium: 3.9
Sodium: 138
Sodium: 138

## 2011-07-03 LAB — CBC
HCT: 31.9 — ABNORMAL LOW
HCT: 37.1
Hemoglobin: 8.5 g/dL — ABNORMAL LOW (ref 12.0–15.0)
Hemoglobin: 10.7 — ABNORMAL LOW
Hemoglobin: 12.2
MCHC: 33.6
MCV: 80.5
Platelets: 255 10*3/uL (ref 150–400)
Platelets: 391
RBC: 3.05 MIL/uL — ABNORMAL LOW (ref 3.87–5.11)
RDW: 13.8
WBC: 9.6 10*3/uL (ref 4.0–10.5)
WBC: 9.1

## 2011-07-03 LAB — LIPID PANEL
Cholesterol: 180
HDL: 28 — ABNORMAL LOW

## 2011-07-03 LAB — GLUCOSE, CAPILLARY
Glucose-Capillary: 119 mg/dL — ABNORMAL HIGH (ref 70–99)
Glucose-Capillary: 130 mg/dL — ABNORMAL HIGH (ref 70–99)

## 2011-07-03 LAB — APTT: aPTT: 35

## 2011-07-03 MED ORDER — SODIUM CHLORIDE 0.9 % IV SOLN
Freq: Once | INTRAVENOUS | Status: AC
  Administered 2011-07-03: 15:00:00 via INTRAVENOUS

## 2011-07-03 MED ORDER — MAGNESIUM CITRATE PO SOLN
100.0000 mL | Freq: Once | ORAL | Status: AC
  Administered 2011-07-04: 100 mL via ORAL
  Filled 2011-07-03: qty 296

## 2011-07-03 MED ORDER — MAGNESIUM CITRATE PO SOLN
100.0000 mL | Freq: Once | ORAL | Status: AC
  Administered 2011-07-03: 100 mL via ORAL
  Filled 2011-07-03: qty 296

## 2011-07-03 MED ORDER — MAGNESIUM CITRATE PO SOLN: 100.0000 mL | Freq: Once | ORAL | Status: DC

## 2011-07-03 NOTE — Progress Notes (Signed)
Physical Therapy Treatment Patient Details Name: Kelli Stone MRN: [id number] DOB: November 16, 1930 Today's Date: 07/03/2011  PT Assessment/Plan  PT - Assessment/Plan Comments on Treatment Session: excellent progress with good pain control and increased gait proficiency.Marland Kitchen. ( ROM R knee is -4to 80 deg) PT Plan: Discharge plan remains appropriate PT Goals  Acute Rehab PT Goals PT Goal: Sit to Supine/Side - Progress: Progressing toward goal PT Transfer Goal: Sit to Stand/Stand to Sit - Progress: Met PT Goal: Ambulate - Progress: Progressing toward goal  PT Treatment Precautions/Restrictions  Precautions Precautions: Knee Required Braces or Orthoses: No Restrictions Weight Bearing Restrictions: Yes RLE Weight Bearing: Weight bearing as tolerated Mobility (including Balance) Bed Mobility Bed Mobility: Yes Right Sidelying to Sit: 4: Min assist;HOB elevated (comment degrees) (min assist to move RLE in bed) Right Sidelying to Sit Details (indicate cue type and reason): assist to move RLE in bed Sitting - Scoot to Edge of Bed: 5: Supervision Transfers Sit to Stand: 4: Min assist Sit to Stand Details (indicate cue type and reason): cues for hand placement Stand to Sit: 5: Supervision Ambulation/Gait Ambulation/Gait: Yes Ambulation/Gait Assistance: 4: Min assist Ambulation Distance (Feet): 30 Feet Assistive device: Rolling walker Gait Pattern: Decreased step length - right;Decreased stance time - right;Decreased weight shift to right;Antalgic Gait velocity: more fluid movement with increased wright RLE Stairs: No Wheelchair Mobility Wheelchair Mobility: No    Exercise  Total Joint Exercises Ankle Circles/Pumps: AROM;Both;10 reps;Supine Quad Sets: AROM;Both;10 reps;Supine Short Arc Quad: AAROM;Right;10 reps;Supine Heel Slides: AAROM;Right;Supine Knee Flexion: AAROM;Right;5 reps;Seated End of Session PT - End of Session Equipment Utilized During Treatment: Gait belt Activity  Tolerance: Patient tolerated treatment well Patient left: in chair;with call bell in reach Nurse Communication: Mobility status for transfers;Mobility status for ambulation General Behavior During Session: [facility] for tasks performed Cognition: [facility] for tasks performed  Konrad Penta 07/03/2011, 9:35 AM

## 2011-07-03 NOTE — Progress Notes (Signed)
Physical Therapy Treatment Patient Details Name: Kelli Stone MRN: [id number] DOB: 31-Aug-1931 Today's Date: 07/03/2011  PT Assessment/Plan  PT - Assessment/Plan Comments on Treatment Session: ROM R knee is -4 to 70 deg AA with increased gait endurance...good progress PT Plan: Discharge plan remains appropriate PT Goals  Acute Rehab PT Goals PT Goal: Sit to Supine/Side - Progress: Progressing toward goal PT Transfer Goal: Sit to Stand/Stand to Sit - Progress: Met PT Goal: Ambulate - Progress: Progressing toward goal  PT Treatment Precautions/Restrictions  Precautions Precautions: Knee Required Braces or Orthoses: No Restrictions Weight Bearing Restrictions: Yes RLE Weight Bearing: Weight bearing as tolerated Mobility (including Balance) Bed Mobility Bed Mobility: Yes Right Sidelying to Sit: 4: Min assist;HOB elevated (comment degrees) (min assist to move RLE in bed) Right Sidelying to Sit Details (indicate cue type and reason): assist to move RLE in bed Sitting - Scoot to Edge of Bed: 5: Supervision Sit to Supine - Right: 4: Min assist Transfers Sit to Stand: 5: Supervision Sit to Stand Details (indicate cue type and reason): cues for hand placement Stand to Sit: 5: Supervision Ambulation/Gait Ambulation/Gait: Yes Ambulation/Gait Assistance: 4: Min assist Ambulation Distance (Feet): 35 Feet Assistive device: Rolling walker Gait Pattern: Decreased step length - right;Decreased stance time - right;Decreased weight shift to right;Antalgic Gait velocity: more fluid movement with increased wright RLE Stairs: No Wheelchair Mobility Wheelchair Mobility: No    Exercise  Total Joint Exercises Ankle Circles/Pumps: AROM;Both;10 reps;Supine Quad Sets: AROM;Both;10 reps;Supine Short Arc Quad: AAROM;Right;10 reps;Supine Heel Slides: AAROM;Right;5 reps Knee Flexion: AAROM;Right;5 reps;Seated End of Session PT - End of Session Equipment Utilized During Treatment: Gait  belt Activity Tolerance: Patient tolerated treatment well Patient left: in bed;in CPM Nurse Communication: Mobility status for transfers;Mobility status for ambulation General Behavior During Session: [facility] for tasks performed Cognition: [facility] for tasks performed  Konrad Penta 07/03/2011, 12:21 PM

## 2011-07-03 NOTE — Progress Notes (Signed)
Dressing change to right knee, cleaned with alcohol, redressed with coverderm, ted hose applied.

## 2011-07-03 NOTE — Progress Notes (Signed)
Subjective: 2 Days Post-Op Procedure(s) (LRB): TOTAL KNEE ARTHROPLASTY (Right) Patient reports pain as 4 on 0-10 scale.    Objective: Vital signs in last 24 hours: Temp:  [98.4 F (36.9 C)-99.5 F (37.5 C)] 98.5 F (36.9 C) (10/17 0500) Pulse Rate:  [66-102] 102  (10/17 0500) Resp:  [16-20] 20  (10/17 0500) BP: (146-194)/(69-91) 150/78 mmHg (10/17 0500) SpO2:  [96 %-99 %] 96 % (10/17 0500)  Intake/Output from previous day: 10/16 0701 - 10/17 0700 In: 610 [P.O.:610] Out: 1300 [Urine:1300] Intake/Output this shift: Total I/O In: -  Out: 400 [Urine:400]   Basename 07/03/11 0449 07/02/11 0535  HGB 8.5* 9.4*    Basename 07/03/11 0449 07/02/11 0535  WBC 9.6 10.0  RBC 3.05* 3.38*  HCT 24.4* 27.1*  PLT 255 270    Basename 07/02/11 0535  NA 134*  K 3.9  CL 101  CO2 26  BUN 10  CREATININE 0.74  GLUCOSE 171*  CALCIUM 8.9   No results found for this basename: LABPT:2,INR:2 in the last 72 hours  Neurologically intact ABD soft Neurovascular intact Sensation intact distally Intact pulses distally Dorsiflexion/Plantar flexion intact Compartment soft D/C to home in am   Assessment/Plan: 2 Days Post-Op Procedure(s) (LRB): TOTAL KNEE ARTHROPLASTY (Right) Up with therapy, transfuse 1 unit red cells  Fuller Canada 07/03/2011, 6:17 AM

## 2011-07-04 LAB — GLUCOSE, CAPILLARY

## 2011-07-04 LAB — CBC
MCH: 27.6 pg (ref 26.0–34.0)
MCHC: 33.8 g/dL (ref 30.0–36.0)
MCV: 81.6 fL (ref 78.0–100.0)
Platelets: 219 10*3/uL (ref 150–400)
RDW: 13.2 % (ref 11.5–15.5)
WBC: 12.2 10*3/uL — ABNORMAL HIGH (ref 4.0–10.5)

## 2011-07-04 MED ORDER — METHOCARBAMOL 500 MG PO TABS: 500.0000 mg | ORAL_TABLET | Freq: Four times a day (QID) | ORAL | Status: AC | PRN

## 2011-07-04 MED ORDER — ASPIRIN 325 MG PO TBEC: 325.0000 mg | DELAYED_RELEASE_TABLET | Freq: Two times a day (BID) | ORAL | Status: AC

## 2011-07-04 MED ORDER — HYDROCODONE-ACETAMINOPHEN 5-325 MG PO TABS: 1.0000 | ORAL_TABLET | ORAL | Status: AC

## 2011-07-04 NOTE — Consult Note (Signed)
Pt requires [facility] transport home due to steps at house.  CSW verified address and that family would be present upon arrival.  No other needs reported. CSW to sign off.  Karn Cassis

## 2011-07-04 NOTE — Discharge Summary (Signed)
Physician Discharge Summary  Patient ID: Kelli Stone MRN: [id number] DOB/AGE: 10-02-1930 77 y.o.  Admit date: 07/01/2011 Discharge date: 07/04/2011  Admission Diagnoses: oa right knee   Discharge Diagnoses: oa right knee  Acute blood loss anemia  Active Problems:  * No active hospital problems. *    Discharged Condition: good  Hospital Course: surgery Monday did well tolerated PT stable for discharge   Consults: none  Significant Diagnostic Studies: hg 9 after transfusuion  Treatments: surgery: rt tka   Discharge Exam: Blood pressure 155/80, pulse 86, temperature 98.6 F (37 C), temperature source Oral, resp. rate 18, height 5\' 3"  (1.6 m), weight 160 lb 15 oz (73 kg), SpO2 97.00%. Incision/Wound:clean  Disposition: Home-Health Care Svc  Discharge Orders    Future Appointments: Provider: Department: Dept Phone: Center:   07/15/2011 3:15 PM Fuller Canada, MD [facility] [phone] [facility]     Future Orders Please Complete By Expires   Dan Humphreys standard      Ambulatory referral to Home Health      Comments:   Please evaluate Kelli Stone for admission to [facility].  Disciplines requested: Physical Therapy  Services to provide: Other: PT 3-5 X PER WEEK, FOR RT TKA  Physician to follow patient's care (the person listed here will be responsible for signing ongoing orders): Referring Provider  Requested Start of Care Date: Tomorrow  Special Instructions:  WBAT CPM X 3 WEEKS REMOVE STAPLES OCT 26   Diet - low sodium heart healthy      Call MD / Call 911      Comments:   If you experience chest pain or shortness of breath, CALL 911 and be transported to the hospital emergency room.  If you develope a fever above 101 F, pus (white drainage) or increased drainage or redness at the wound, or calf pain, call your surgeon's office.   Constipation Prevention      Comments:   Drink plenty of fluids.  Prune juice may be helpful.  You may use a stool softener,  such as Colace (over the counter) 100 mg twice a day.  Use MiraLax (over the counter) for constipation as needed.   Increase activity slowly as tolerated      Weight Bearing as taught in Physical Therapy      Comments:   Use a walker or crutches as instructed.   CPM      Comments:   Continuous passive motion machine (CPM):      Use the CPM from 0 to 70 for 8 hours per day.      You may increase by 10 degrees per day.  You may break it up into 2 or 3 sessions per day.      Use CPM for 3 weeks or until you are told to stop.   TED hose      Comments:   Use stockings (TED hose) for 4 weeks on both leg(s).  You may remove them at night for sleeping.   Change dressing      Comments:   Change dressing on friday, then change the dressing daily with sterile 4 x 4 inch gauze dressing and apply TED hose.  You may clean the incision with alcohol prior to redressing.   Do not put a pillow under the knee. Place it under the heel.        Discharge Medication List as of 07/04/2011 12:46 PM    START taking these medications   Details  aspirin  EC 325 MG EC tablet Take 1 tablet (325 mg total) by mouth 2 (two) times daily., Starting 07/04/2011, Until Sun 07/14/11, Print    HYDROcodone-acetaminophen (NORCO) 5-325 MG per tablet Take 1-2 tablets by mouth every 4 (four) hours., Starting 07/04/2011, Until Sun 07/14/11, Print    methocarbamol (ROBAXIN) 500 MG tablet Take 1 tablet (500 mg total) by mouth every 6 (six) hours as needed., Starting 07/04/2011, Until Sun 07/14/11, Print      CONTINUE these medications which have NOT CHANGED   Details  amLODipine (NORVASC) 5 MG tablet Take 5 mg by mouth daily.  , Until Discontinued, Historical Med    ferrous fumarate (HEMOCYTE - 106 MG FE) 325 (106 FE) MG TABS Take 1 tablet by mouth.  , Until Discontinued, Historical Med    latanoprost (XALATAN) 0.005 % ophthalmic solution Place 1 drop into both eyes at bedtime.  , Until Discontinued, Historical Med      lisinopril (PRINIVIL,ZESTRIL) 40 MG tablet Take 40 mg by mouth daily.  , Until Discontinued, Historical Med    Omeprazole (PRILOSEC PO) Take by mouth.  , Until Discontinued, Historical Med    senna-docusate (SENOKOT-S) 8.6-50 MG per tablet Take 2 tablets by mouth daily as needed for constipation., Starting 06/13/2011, Until Fri 06/12/12, Fax    timolol (BETIMOL) 0.25 % ophthalmic solution Place 1 drop into both eyes every morning. , Until Discontinued, Historical Med      STOP taking these medications     HYDROcodone-acetaminophen (VICODIN) 5-500 MG per tablet      HYDROcodone-acetaminophen (VICODIN) 5-500 MG per tablet          Signed: Fuller Canada 07/04/2011, 4:41 PM

## 2011-07-04 NOTE — Progress Notes (Signed)
Patient was given discharge instructions along with follow up appointments and prescriptions. Patient verbalized understanding of all instructions. Patient was escorted by staff via wheelchair to vehicle. Patient discharged to home in stable condition. 

## 2011-07-04 NOTE — Progress Notes (Signed)
Subjective: 3 Days Post-Op Procedure(s) (LRB): TOTAL KNEE ARTHROPLASTY (Right) Patient reports pain as mild.    Objective: Vital signs in last 24 hours: Temp:  [98.2 F (36.8 C)-98.9 F (37.2 C)] 98.6 F (37 C) (10/18 0532) Pulse Rate:  [84-103] 92  (10/18 0532) Resp:  [16-20] 18  (10/18 0532) BP: (111-164)/(69-96) 155/80 mmHg (10/18 0532) SpO2:  [98 %-100 %] 98 % (10/18 0532)  Intake/Output from previous day: 10/17 0701 - 10/18 0700 In: 1152.5 [P.O.:510; I.V.:330; Blood:312.5] Out: 700 [Urine:700] Intake/Output this shift:     Basename 07/04/11 0505 07/03/11 0449 07/02/11 0535  HGB 9.0* 8.5* 9.4*    Basename 07/04/11 0505 07/03/11 0449  WBC 12.2* 9.6  RBC 3.26* 3.05*  HCT 26.6* 24.4*  PLT 219 255    Basename 07/02/11 0535  NA 134*  K 3.9  CL 101  CO2 26  BUN 10  CREATININE 0.74  GLUCOSE 171*  CALCIUM 8.9   No results found for this basename: LABPT:2,INR:2 in the last 72 hours  Neurologically intact Neurovascular intact Sensation intact distally Intact pulses distally Dorsiflexion/Plantar flexion intact Incision: scant drainage  Assessment/Plan: 3 Days Post-Op Procedure(s) (LRB): TOTAL KNEE ARTHROPLASTY (Right) Discharge home with home health  Fuller Canada 07/04/2011, 7:40 AM

## 2011-07-04 NOTE — Progress Notes (Signed)
Physical Therapy Treatment Patient Details Name: Kelli Stone MRN: [id number] DOB: [birth year] Today's Date: 07/04/2011  TIME: 910-945/ 1 GT 1 TA  PT Assessment/Plan  PT - Assessment/Plan Comments on Treatment Session: Pt able to ambulate 30' x2;no LOB;pt not yet ready for stairs due to decreased endurance/strength, ambulance will take her home;Pt supervision for standing pivot transfer to [facility] PT Goals  Acute Rehab PT Goals PT Goal: Sit to Supine/Side - Progress: Met PT Transfer Goal: Sit to Stand/Stand to Sit - Progress: Met PT Goal: Ambulate - Progress: Progressing toward goal PT Goal: Up/Down Stairs - Progress: Not met  PT Treatment Precautions/Restrictions  Precautions Precautions: Knee Required Braces or Orthoses: No Restrictions Weight Bearing Restrictions: Yes RLE Weight Bearing: Weight bearing as tolerated Mobility (including Balance) Bed Mobility Right Sidelying to Sit: 6: Modified independent (Device/Increase time) Sitting - Scoot to Edge of Bed: 6: Modified independent (Device/Increase time) Sit to Supine - Right: 6: Modified independent (Device/Increase time) Transfers Transfers: Yes Sit to Stand: 5: Supervision Stand to Sit: 5: Supervision Ambulation/Gait Ambulation/Gait: Yes Ambulation/Gait Assistance: 4: Min assist Ambulation/Gait Assistance Details (indicate cue type and reason): RW;VCs for hip/knee flexion Ambulation Distance (Feet): 60 Feet (30' seated rest break;additional 30') Assistive device: Rolling walker Gait Pattern: Step-to pattern;Decreased hip/knee flexion - right;Decreased dorsiflexion - right;Antalgic Stairs: No (due to decreased endurance and anxiety of pt) Wheelchair Mobility Wheelchair Mobility: No    Exercise  Other Exercises Other Exercises: Seated marching x25 End of Session PT - End of Session Equipment Utilized During Treatment: Gait belt Activity Tolerance: Patient limited by fatigue;Patient limited by pain Patient left: in  bed;with call bell in reach;with bed alarm set General Behavior During Session: [facility] for tasks performed Cognition: [facility] for tasks performed  Kelli Stone 07/04/2011, 10:05 AM

## 2011-07-08 ENCOUNTER — Encounter (HOSPITAL_COMMUNITY): Payer: Self-pay | Admitting: Orthopedic Surgery

## 2011-07-08 ENCOUNTER — Telehealth: Payer: Self-pay | Admitting: Orthopedic Surgery

## 2011-07-08 NOTE — Telephone Encounter (Signed)
Yes

## 2011-07-08 NOTE — Telephone Encounter (Signed)
Kelli Stone  ([birth year]) asked if she really needs to wear the stocking, says it is making her leg swell and she wants to know if ok for her to remove it. Her # [phone]

## 2011-07-08 NOTE — Telephone Encounter (Signed)
Advised the patient , she says she feels better now, after therapy today

## 2011-07-10 LAB — TYPE AND SCREEN: Antibody Screen: NEGATIVE

## 2011-07-15 ENCOUNTER — Ambulatory Visit (INDEPENDENT_AMBULATORY_CARE_PROVIDER_SITE_OTHER): Payer: Medicare HMO | Admitting: Orthopedic Surgery

## 2011-07-15 ENCOUNTER — Encounter: Payer: Self-pay | Admitting: Orthopedic Surgery

## 2011-07-15 VITALS — BP 160/70 | Ht 63.0 in | Wt 159.0 lb

## 2011-07-15 DIAGNOSIS — Z96659 Presence of unspecified artificial knee joint: Secondary | ICD-10-CM

## 2011-07-15 NOTE — Progress Notes (Signed)
PROCEDURE RIGHT TKA DEPUY 73F 3T 12.5 INSERT 35P  DOS 07/01/2011  Preop diagnosis osteoarthritis RIGHT knee  Postop diagnosis same  Procedure RIGHT total knee arthroplasty Postop visit #1, postop week #2 day 14.  The patient is at the nursing center.  She is doing well. Her incision looks clean, dry, and intact. There are no signs of infection. The calf is soft.  She is to continue physical therapy. Return in 2 weeks for general reassessment.

## 2011-07-15 NOTE — Patient Instructions (Signed)
Wear knee brace night

## 2011-07-30 ENCOUNTER — Encounter: Payer: Self-pay | Admitting: Orthopedic Surgery

## 2011-07-30 ENCOUNTER — Ambulatory Visit (INDEPENDENT_AMBULATORY_CARE_PROVIDER_SITE_OTHER): Payer: Medicare HMO | Admitting: Orthopedic Surgery

## 2011-07-30 VITALS — BP 160/70 | Ht 63.0 in | Wt 155.0 lb

## 2011-07-30 DIAGNOSIS — Z96659 Presence of unspecified artificial knee joint: Secondary | ICD-10-CM

## 2011-07-30 NOTE — Progress Notes (Signed)
PROCEDURE RIGHT TKA DEPUY 74F 3T 12.5 INSERT 35P  DOS 07/01/2011  Preop diagnosis osteoarthritis RIGHT knee  Postop diagnosis same  Procedure RIGHT total knee arthroplasty   POST OP VISIT   ROM 0-110  CANE FOR WALKING   MINIMAL PAIN   KNEE LOOKS GOOD NO SWELLING INCISION CLEAN HEALED   ADV TO OUT PATIENT PT    2 MO F/U .

## 2011-07-30 NOTE — Patient Instructions (Signed)
Start outpatient therapy  

## 2011-08-02 ENCOUNTER — Ambulatory Visit (HOSPITAL_COMMUNITY)
Admission: RE | Admit: 2011-08-02 | Discharge: 2011-08-02 | Disposition: A | Discharge status: Home / Self Care | Payer: Medicare HMO | Source: Ambulatory Visit | Attending: Orthopedic Surgery | Admitting: Orthopedic Surgery

## 2011-08-02 DIAGNOSIS — I10 Essential (primary) hypertension: Secondary | ICD-10-CM | POA: Insufficient documentation

## 2011-08-02 DIAGNOSIS — R262 Difficulty in walking, not elsewhere classified: Secondary | ICD-10-CM | POA: Insufficient documentation

## 2011-08-02 DIAGNOSIS — M25569 Pain in unspecified knee: Secondary | ICD-10-CM | POA: Insufficient documentation

## 2011-08-02 DIAGNOSIS — E119 Type 2 diabetes mellitus without complications: Secondary | ICD-10-CM | POA: Insufficient documentation

## 2011-08-02 DIAGNOSIS — M6281 Muscle weakness (generalized): Secondary | ICD-10-CM | POA: Insufficient documentation

## 2011-08-02 DIAGNOSIS — IMO0001 Reserved for inherently not codable concepts without codable children: Secondary | ICD-10-CM | POA: Insufficient documentation

## 2011-08-02 NOTE — Progress Notes (Signed)
Physical Therapy Evaluation - [facility]  Patient Details  Name: Kelli Stone MRN: [id number] Date of Birth: May 30, 1931  Today's Date: 08/02/2011 Time: 7829-5621  Charges: 1 eval Visit#: 1 of 12 Re-eval:      Past Medical History:  Past Medical History  Diagnosis Date  . Glaucoma   . GERD (gastroesophageal reflux disease)   . Personal history of tobacco use, presenting hazards to health   . Personal history of allergy to penicillin   . Personal history of allergy to sulfonamides   . Vertigo     HISTORY OF  . Diabetes mellitus     diet controlled  . HTN (hypertension)   . Pure hypercholesterolemia    Past Surgical History:  Past Surgical History  Procedure Date  . Appendectomy   . Right shoulder   . Shoulder surgery   . Breast surgery bilateral  . Vaginal hysterectomy   . Total knee arthroplasty 07/01/2011    Procedure: TOTAL KNEE ARTHROPLASTY;  Surgeon: Fuller Canada, MD;  Location: [facility];  Service: Orthopedics;  Laterality: Right;  Depuy    Subjective Symptoms/Limitations Symptoms: Pt is referred to PT s/p R TKA on 07/01/11.  Pt reports that she was doing very well, until the other night when it really started hurting. Pain Range: 0-7/10.  Aggrevating: standing/walking, sitting; alleviating: Ice pack, with rubbing it and elevating How long can you sit comfortably?: 60 minutes; no difficulty getting into and out of her car.  How long can you stand comfortably?: 5-10  minutes ( would llike to go for 60 minutes to cook) How long can you walk comfortably?: SPC x (would like to walk independently 30 minutes to go to church) Pain Assessment Currently in Pain?: Yes Pain Score:   5 Pain Location: Knee Pain Orientation: Right  08/02/11 1300  Assessment  Diagnosis R TKA  Surgical Date 07/01/11  Next MD Visit 10/01/10  Prior Therapy HHPT ended yestereday  Restrictions  Weight Bearing Restrictions No  Home Living  Lives With Spouse  Receives Help From Family    Type of Home House  Home Layout Two level  Alternate Level Stairs-Number of Steps 2  Home Access Stairs to enter  Entrance Stairs-Number of Steps 2  Flexibility  90/90 Positive  Functional Tests  Functional Tests 5 STS: 18.5 sec w/UE support   RLE AROM (degrees)  Right Knee Extension 0-130 -12   Right Knee Flexion 0-140 85   RLE PROM (degrees)  Right Knee Extension 0-130 -5   Right Knee Flexion 0-140 92   RLE Strength  Right Hip Flexion 3+/5  Right Hip Extension 3+/5  Right Hip ABduction 3+/5  Right Hip ADduction 3/5  Right Knee Flexion 3/5  Right Knee Extension 3/5  Static Standing Balance  Single Leg Stance - Right Leg 10   Single Leg Stance - Left Leg 10   Tandem Stance - Right Leg 10   Tandem Stance - Left Leg 10   Rhomberg - Eyes Opened 10   Rhomberg - Eyes Closed 10       Exercise/Treatments  08/02/11 1330  Knee Exercises: Stretches  Active Hamstring Stretch 30 seconds  Knee Exercises: Supine  Quad Sets Right;4 sets  Short Arc Quad Sets 10 reps  Knee Exercises: Sidelying  Hip ABduction 10 reps  Hip ADduction 10 reps  Knee Exercises: Prone  Hamstring Curl 10 reps;Limitations  Hamstring Curl Limitations AAROM w/LLE  Hip Extension 10 reps        Physical Therapy Assessment  and Plan      Goals    Problem List Patient Active Problem List  Diagnoses  . HTN (hypertension)  . Pure hypercholesterolemia  . Aortic stenosis  . Dyspnea  . OA (osteoarthritis) of knee  . Arthritis of knee, right  . Constipated  . S/P total knee replacement        Marybell Robards 08/02/2011, 6:50 PM  Physician Documentation Your signature is required to indicate approval of the treatment plan as stated above.  Please sign and either send electronically or make a copy of this report for your files and return this physician signed original.   Please mark one 1.__approve of plan  2. ___approve of plan with the following conditions.   ______________________________                                                           _____________________ Physician Signature                                                                                                             Date

## 2011-08-07 ENCOUNTER — Ambulatory Visit (HOSPITAL_COMMUNITY)
Admission: RE | Admit: 2011-08-07 | Discharge: 2011-08-07 | Disposition: A | Discharge status: Home / Self Care | Payer: Medicare HMO | Source: Ambulatory Visit | Attending: Internal Medicine | Admitting: Internal Medicine

## 2011-08-07 DIAGNOSIS — R262 Difficulty in walking, not elsewhere classified: Secondary | ICD-10-CM | POA: Insufficient documentation

## 2011-08-07 NOTE — Progress Notes (Signed)
Physical Therapy Treatment Patient Details  Name: Kelli Stone MRN: [id number] Date of Birth: Jul 18, 1931  Today's Date: 08/07/2011 Time: 1111-1201 Time Calculation (min): 50 min Charges: 30' TE, 10' Manual, 1 ice Visit#: 2  of 12   Re-eval: 09/01/11    Subjective: Symptoms/Limitations Symptoms: Pt reports that she is a little sore today since she went to get her hair done this morning.  Pain Assessment Pain Score:   4 Pain Location: Knee Exercise/Treatments Stretches Quad Stretch: 3 reps;30 seconds Aerobic Stationary Bike: 6 min for ROM w/10 sec holds Standing Heel Raises: 10 reps Functional Squat: 10 reps Other Standing Knee Exercises: Toe Raises 10x Other Standing Knee Exercises: Tandem Stance BLE 3x30  Supine Short Arc Quad Sets: 15 reps Sidelying Hip ABduction: 15 reps Prone  Hamstring Curl: 2 sets;10 reps Hip Extension: 15 reps Modalities Modalities: Cryotherapy Manual Therapy Manual Therapy: Joint mobilization Joint Mobilization: Grade II-III joint mobs in prone to increase knee flexion w/PROM and contract/relax techniques afterwards x10 minutes.  Cryotherapy Number Minutes Cryotherapy: 10 Minutes Cryotherapy Location: Knee Type of Cryotherapy: Ice pack  Physical Therapy Assessment and Plan PT Assessment and Plan Clinical Impression Statement: Pt continues to be limited by decreaed kneed ROM with ambulation.  She is able to demonstrate adherence with HEP.  Had improved knee flexion after manual techniques.  PT Plan: Continue to progress. Add stair training next visit    Goals    Problem List Patient Active Problem List  Diagnoses  . HTN (hypertension)  . Pure hypercholesterolemia  . Aortic stenosis  . Dyspnea  . OA (osteoarthritis) of knee  . Arthritis of knee, right  . Constipated  . S/P total knee replacement  . Difficulty in walking  . Stiffness of joint, not elsewhere classified, lower leg  . Pain in joint, lower leg    PT - End of  Session Activity Tolerance: Patient tolerated treatment well  Willian Donson 08/07/2011, 12:09 PM

## 2011-08-13 ENCOUNTER — Ambulatory Visit (HOSPITAL_COMMUNITY)
Admission: RE | Admit: 2011-08-13 | Discharge: 2011-08-13 | Disposition: A | Discharge status: Home / Self Care | Payer: Medicare HMO | Source: Ambulatory Visit | Attending: Orthopedic Surgery | Admitting: Orthopedic Surgery

## 2011-08-13 NOTE — Progress Notes (Signed)
Physical Therapy Treatment Patient Details  Name: Kelli Stone MRN: [id number] Date of Birth: 1931/07/09  Today's Date: 08/13/2011 Time: 7829-5621 Time Calculation (min): 57 min Visit#: 3  of 12   Re-eval: 08/30/11 Charges:  therex 44', Ice 10'    Subjective: Symptoms/Limitations Symptoms: Pt./son report compliance with HEP.  Pt. states it just hurts to bend it, otherwise doing OK. Pain Assessment Currently in Pain?: Yes Pain Score:   3 Pain Location: Knee  Exercise/Treatments Stretches Quad Stretch: 3 reps;30 seconds;Limitations Aerobic Stationary Bike: 6 mint for ROM w/10 sec holds Standing Heel Raises: 15 reps Functional Squat: 15 reps Other Standing Knee Exercises: Toe Raises 15x Other Standing Knee Exercises: Tandem Stance BLE 3x30  Supine Short Arc Quad Sets: 15 reps Heel Slides: 10 reps;Right Sidelying Hip ABduction: 15 reps Prone  Hamstring Curl: 2 sets;10 reps Hip Extension: 15 reps   Modalities Modalities: Cryotherapy Manual Therapy Joint Mobilization: PROM to R knee for extension/flexion.  PROM flexion to 105 degrees today. Cryotherapy Number Minutes Cryotherapy: 10 Minutes Cryotherapy Location: Knee Type of Cryotherapy: Ice pack  Physical Therapy Assessment and Plan PT Assessment and Plan Clinical Impression Statement: Son present for treatment today to ensure they were doing her HEP correctly.  Pt. able to tolerate increased PROM today.  Progressing well with stength and ROM. PT Plan: Continue to progress.  Add bridge, SLR and begin step ups/downs with 4" step.     Problem List Patient Active Problem List  Diagnoses  . HTN (hypertension)  . Pure hypercholesterolemia  . Aortic stenosis  . Dyspnea  . OA (osteoarthritis) of knee  . Arthritis of knee, right  . Constipated  . S/P total knee replacement  . Difficulty in walking  . Stiffness of joint, not elsewhere classified, lower leg  . Pain in joint, lower leg    PT - End of  Session Activity Tolerance: Patient tolerated treatment well General Behavior During Session: [facility] for tasks performed Cognition: [facility] for tasks performed  Emeline Gins B 08/13/2011, 3:03 PM

## 2011-08-15 ENCOUNTER — Ambulatory Visit (INDEPENDENT_AMBULATORY_CARE_PROVIDER_SITE_OTHER): Payer: Medicare HMO | Admitting: Orthopedic Surgery

## 2011-08-15 ENCOUNTER — Ambulatory Visit (HOSPITAL_COMMUNITY)
Admission: RE | Admit: 2011-08-15 | Discharge: 2011-08-15 | Payer: Medicare HMO | Source: Ambulatory Visit | Attending: Orthopedic Surgery | Admitting: Orthopedic Surgery

## 2011-08-15 ENCOUNTER — Encounter: Payer: Self-pay | Admitting: Orthopedic Surgery

## 2011-08-15 VITALS — BP 140/80 | Ht 63.0 in | Wt 155.0 lb

## 2011-08-15 DIAGNOSIS — S86119A Strain of other muscle(s) and tendon(s) of posterior muscle group at lower leg level, unspecified leg, initial encounter: Secondary | ICD-10-CM

## 2011-08-15 DIAGNOSIS — S838X9A Sprain of other specified parts of unspecified knee, initial encounter: Secondary | ICD-10-CM

## 2011-08-15 DIAGNOSIS — M7551 Bursitis of right shoulder: Secondary | ICD-10-CM

## 2011-08-15 DIAGNOSIS — M67919 Unspecified disorder of synovium and tendon, unspecified shoulder: Secondary | ICD-10-CM

## 2011-08-15 DIAGNOSIS — M719 Bursopathy, unspecified: Secondary | ICD-10-CM

## 2011-08-15 NOTE — Patient Instructions (Addendum)
Ace wrap  Romeo Apple gay 3 x a day   Heat 3 x a day   You have received a steroid shot. 15% of patients experience increased pain at the injection site with in the next 24 hours. This is best treated with ice and tylenol extra strength 2 tabs every 8 hours. If you are still having pain please call the office.

## 2011-08-15 NOTE — Progress Notes (Signed)
PROCEDURE RIGHT TKA DEPUY 70F 3T 12.5 INSERT 35P  DOS 07/01/2011  Preop diagnosis osteoarthritis RIGHT knee  Postop diagnosis same  Procedure RIGHT total knee arthroplasty  POST OP VISIT   Complains of RIGHT medial calf pain.  The only significant changes, that she's started to walk more. The neurovascular status of the limb is normal. There is no swelling of the limb. There is tenderness of the medial calf. Knee ROM = 105 DEGREES  The strength and limb seems to be improving.   Also complains of RIGHT shoulder pain. She is also having difficulty sleeping on the RIGHT side and with forward elevation of the arm with a catching sensation and 90  She underwent arthroscopic mini open rotator cuff repair. Approximately 5 years ago was doing well until she underwent her knee replacement and in her shoulders are bothering her when she went home and on a cane. She would like an injection in the RIGHT shoulder.  RIGHT shoulder injection Shoulder Injection Procedure Note   Pre-operative Diagnosis: right  RC Syndrome  Post-operative Diagnosis: same  Indications: pain   Anesthesia: ethyl chloride   Procedure Details   Verbal consent was obtained for the procedure. The shoulder was prepped withalcohol and the skin was anesthetized. A 20 gauge needle was advanced into the subacromial space through posterior approach without difficulty  The space was then injected with 3 ml 1% lidocaine and 1 ml of depomedrol. The injection site was cleansed with isopropyl alcohol and a dressing was applied.  Complications:  None; patient tolerated the procedure well.  DX: Calf strain Rt sh bursitis

## 2011-08-15 NOTE — Progress Notes (Signed)
  Patient Details  Name: Kelli Stone MRN: [id number] Date of Birth: [birth year]  Today's Date: 08/15/2011  Pt. Came to scheduled appointment accompanied by son.  Pt. Had severe antalgic gait and explained she has really been hurting for the past 24 hours.  Pt. Also stated she had called Dr. Mort Sawyers office and was going to see him after her therapy today.  Pt. Described sharp pain into her Right calf that increased with touch or weight bearing.  Therapy was held and pt was instructed to go to straight to her doctor appointment.  Bascom Levels, Amy B 08/15/2011, 1:25 PM

## 2011-08-16 ENCOUNTER — Ambulatory Visit (HOSPITAL_COMMUNITY)
Admission: RE | Admit: 2011-08-16 | Discharge: 2011-08-16 | Disposition: A | Discharge status: Home / Self Care | Payer: Medicare HMO | Source: Ambulatory Visit | Attending: Internal Medicine | Admitting: Internal Medicine

## 2011-08-16 NOTE — Progress Notes (Signed)
Physical Therapy Treatment Patient Details  Name: Kelli Stone MRN: [id number] Date of Birth: 08-06-1931  Today's Date: 08/16/2011 Time: 9562-1308 Time Calculation (min): 53 min Charges: 28' TE, 10' Manual, 1 ice Visit#: 4  of 12   Re-eval: 09/09/11    Subjective: Symptoms/Limitations Symptoms: pt reports that she went to see Dr. Romeo Apple and after reading his latested note cleared her of a DVT and diagnosed muscle pain from increased activity.  Continues to have pain to her L calf.  After examiniation she has increased tenderness without any increased warmth.  Pain Assessment Currently in Pain?: Yes  Exercise/Treatments Stretches Quad Stretch: 3 reps;30 seconds;Limitations Gastroc Stretch: 3 reps;30 seconds Aerobic Stationary Bike: 8 min for ROM with manual assistance to go around forward and backward.  Pt able to complete full rotation for last 2 minutes  Standing Heel Raises: 15 reps Lateral Step Up: Right;1 set;10 reps;Hand Hold: 0;Step Height: 2" Forward Step Up: Right;1 set;10 reps;Hand Hold: 0;Step Height: 2" Step Down: Right;1 set;10 reps;Step Height: 2";Hand Hold: 0 Functional Squat: 15 reps SLS: RLE 3x30 sec w/intermittent HHA Other Standing Knee Exercises: Toe Raises 15x Other Standing Knee Exercises: Tandem Stance 1x30 sec: d/c Prone  Hamstring Curl: 15 reps Other Prone Exercises: TKE 5x5 sec hold   Manual Therapy Joint Mobilization: Grade I-III joint mobs in prone and supine. PROM: 5-107; AROM: 10-102 x10 minutes Cryotherapy Number Minutes Cryotherapy: 10 Minutes Cryotherapy Location: Knee Type of Cryotherapy: Ice pack  Physical Therapy Assessment and Plan PT Assessment and Plan Clinical Impression Statement: Husband present durning today's session.  She had made improvement in functional ROM with ther-ex activities today, however continues to favor RLE and has increased pain with manual techniques.  Instructed and encouraged pt to continue with exercises  at home.  PT Plan: Add Bridge and SLR and prone hang next visit.  COnt to focus on improving knee extension    Goals    Problem List Patient Active Problem List  Diagnoses  . HTN (hypertension)  . Pure hypercholesterolemia  . Aortic stenosis  . Dyspnea  . OA (osteoarthritis) of knee  . Arthritis of knee, right  . Constipated  . S/P total knee replacement  . Difficulty in walking  . Stiffness of joint, not elsewhere classified, lower leg  . Pain in joint, lower leg  . Bursitis of shoulder, right  . Gastrocnemius strain    PT - End of Session Activity Tolerance: Patient tolerated treatment well  Kelli Stone 08/16/2011, 2:04 PM

## 2011-08-19 ENCOUNTER — Ambulatory Visit (HOSPITAL_COMMUNITY)
Admission: RE | Admit: 2011-08-19 | Discharge: 2011-08-19 | Disposition: A | Discharge status: Home / Self Care | Payer: Medicare HMO | Source: Ambulatory Visit | Attending: Orthopedic Surgery | Admitting: Orthopedic Surgery

## 2011-08-19 DIAGNOSIS — E119 Type 2 diabetes mellitus without complications: Secondary | ICD-10-CM | POA: Insufficient documentation

## 2011-08-19 DIAGNOSIS — I10 Essential (primary) hypertension: Secondary | ICD-10-CM | POA: Insufficient documentation

## 2011-08-19 DIAGNOSIS — R262 Difficulty in walking, not elsewhere classified: Secondary | ICD-10-CM | POA: Insufficient documentation

## 2011-08-19 DIAGNOSIS — IMO0001 Reserved for inherently not codable concepts without codable children: Secondary | ICD-10-CM | POA: Insufficient documentation

## 2011-08-19 DIAGNOSIS — M25569 Pain in unspecified knee: Secondary | ICD-10-CM | POA: Insufficient documentation

## 2011-08-19 DIAGNOSIS — M6281 Muscle weakness (generalized): Secondary | ICD-10-CM | POA: Insufficient documentation

## 2011-08-19 NOTE — Progress Notes (Signed)
Physical Therapy Treatment Patient Details  Name: Kelli Stone MRN: [id number] Date of Birth: [birth year]  Today's Date: 08/19/2011 Time: 1300-1350 Time Calculation (min): 50 min Visit#: 5  of 12   Re-eval: 09/09/11 Charges:  therex 30', manual 6', icepack 10'    Subjective: Symptoms/Limitations Symptoms: Pt. states her leg is feeling better.  Just a little pain in her knee; mostly with transitional movements; has to warm up after initially getting moving then pain decreases. Pain Assessment Currently in Pain?: Yes Pain Score:   2 Pain Location: Knee Pain Orientation: Right   Exercise/Treatments Stretches Active Hamstring Stretch: 2 reps;30 seconds;Limitations Quad Stretch: 3 reps;30 seconds;Limitations Aerobic Stationary Bike: 8' @2 .0, seat 9.  Able to make full revolutions Standing Heel Raises: 20 reps Lateral Step Up: 15 reps;Step Height: 4" Forward Step Up: 15 reps;Step Height: 4" Step Down: 15 reps;Step Height: 2" Functional Squat: 20 reps SLS: RLE 15" best of 5 trials without UE's Other Standing Knee Exercises: Toe Raises 20x Supine Short Arc Quad Sets: 20 reps Heel Slides: 15 reps Terminal Knee Extension: 15 reps (with towel  roll) Other Supine Knee Exercises: PROM for extension/flexion Other Supine Knee Exercises: MFR to ant/post.R knee Sidelying Hip ABduction: 20 reps  Modalities Modalities: Cryotherapy Manual Therapy Joint Mobilization: MFR with AAROM afterward 10-102 degrees. Cryotherapy Number Minutes Cryotherapy: 10 Minutes Cryotherapy Location: Knee Type of Cryotherapy: Ice pack  Physical Therapy Assessment and Plan PT Assessment and Plan Clinical Impression Statement: Most pain posterior knee with noted tightness with MFR.  Pt. instructed not to sleep with pillow under knee (states that's how she's been sleeping).   PT Plan: Progress ROM and decrease pain.  Add prone knee hang next visit to work on extension.    Problem List Patient Active  Problem List  Diagnoses  . HTN (hypertension)  . Pure hypercholesterolemia  . Aortic stenosis  . Dyspnea  . OA (osteoarthritis) of knee  . Arthritis of knee, right  . Constipated  . S/P total knee replacement  . Difficulty in walking  . Stiffness of joint, not elsewhere classified, lower leg  . Pain in joint, lower leg  . Bursitis of shoulder, right  . Gastrocnemius strain    PT - End of Session Activity Tolerance: Patient tolerated treatment well General Behavior During Session: [facility] for tasks performed Cognition: [facility] for tasks performed  Emeline Gins B 08/19/2011, 1:47 PM

## 2011-08-21 ENCOUNTER — Telehealth (HOSPITAL_COMMUNITY): Payer: Self-pay

## 2011-08-21 ENCOUNTER — Ambulatory Visit (HOSPITAL_COMMUNITY): Payer: Medicare HMO | Admitting: *Deleted

## 2011-08-23 ENCOUNTER — Ambulatory Visit (HOSPITAL_COMMUNITY)
Admission: RE | Admit: 2011-08-23 | Discharge: 2011-08-23 | Disposition: A | Discharge status: Home / Self Care | Payer: Medicare HMO | Source: Ambulatory Visit | Attending: Internal Medicine | Admitting: Internal Medicine

## 2011-08-23 NOTE — Progress Notes (Signed)
Physical Therapy Treatment Patient Details  Name: Kelli Stone MRN: [id number] Date of Birth: 1930-11-12  Today's Date: 08/23/2011 Time: 1301-1403 Time Calculation (min): 62 min Visit#: 6  of 12   Re-eval: 09/09/11 Charges: Therex x 42'  Subjective: Symptoms/Limitations Symptoms: I don't really have any pain because I took a pain pill. It's just really stiff. Pain Assessment Currently in Pain?: No/denies Pain Score: 0-No pain   Exercise/Treatments Stretches Active Hamstring Stretch: 3 reps;30 seconds Aerobic Stationary Bike: 6' @2 .0, seat 9.  Able to make full revolutions Standing Heel Raises: 20 reps Lateral Step Up: 20 reps;Step Height: 4" Forward Step Up: 20 reps;Step Height: 4" Step Down: 20 reps;Step Height: 4" Functional Squat: 20 reps SLS: RLE 18" max 57f 5 Other Standing Knee Exercises: Toe Raises 20x Supine Short Arc Quad Sets: 20 reps Heel Slides: 15 reps Other Supine Knee Exercises: PROM for extension/flexion Sidelying Hip ABduction: 15 reps;Limitations Hip ABduction Limitations: 3# Prone  Hamstring Curl: 15 reps;Limitations Hamstring Curl Limitations: 3# Other Prone Exercises: TKE 10x5 sec hold   Modalities Modalities: Cryotherapy Cryotherapy Number Minutes Cryotherapy: 8 Minutes (Unable to tolerate 10' secondary to back discomfort) Cryotherapy Location: Knee (R) Type of Cryotherapy: Ice pack  Physical Therapy Assessment and Plan PT Assessment and Plan Clinical Impression Statement: Pt completes therex with good form and minimal need for cueing. Pt tolerates increase in wt with mat exercises w/o difficulty. Unable begin prone knee hang secondary to time.Pt reports no pain at end of session. PT Plan: Continue to progress per PT POC. Begin prone knee hang next visit.     Problem List Patient Active Problem List  Diagnoses  . HTN (hypertension)  . Pure hypercholesterolemia  . Aortic stenosis  . Dyspnea  . OA (osteoarthritis) of knee  .  Arthritis of knee, right  . Constipated  . S/P total knee replacement  . Difficulty in walking  . Stiffness of joint, not elsewhere classified, lower leg  . Pain in joint, lower leg  . Bursitis of shoulder, right  . Gastrocnemius strain    PT - End of Session Activity Tolerance: Patient tolerated treatment well General Behavior During Session: [facility] for tasks performed Cognition: [facility] for tasks performed  Antonieta Iba 08/23/2011, 2:03 PM

## 2011-08-26 ENCOUNTER — Ambulatory Visit (HOSPITAL_COMMUNITY)
Admission: RE | Admit: 2011-08-26 | Discharge: 2011-08-26 | Disposition: A | Discharge status: Home / Self Care | Payer: Medicare HMO | Source: Ambulatory Visit | Attending: Internal Medicine | Admitting: Internal Medicine

## 2011-08-26 NOTE — Progress Notes (Signed)
Physical Therapy Treatment Patient Details  Name: MARQUITA LIAS MRN: [id number] Date of Birth: 06/22/1931  Today's Date: 08/26/2011 Time: 4098-1191 Time Calculation (min): 49 min Charges: 24' TE, 10' manual, 1 ice Visit#: 7  of 12   Re-eval: 09/09/11    Subjective: Symptoms/Limitations Symptoms: I went to church for the first time this weekend and I did pretty well.  I am doing my exercises at home.  Pain Assessment Pain Score: 0-No pain Exercise/Treatments Stretches Quad Stretch: 3 reps;30 seconds;Limitations Aerobic Stationary Bike: 6' 3.0 Machines for Strengthening Cybex Knee Extension: 2 PL 2x10 Cybex Knee Flexion: 3 PL 2x10 Standing Heel Raises: 20 reps Lateral Step Up: 20 reps;Step Height: 4" Forward Step Up: 20 reps;Step Height: 4" Step Down: 20 reps;Step Height: 4" Functional Squat: 20 reps Other Standing Knee Exercises: Toe Raises 20x Supine Quad Sets: 1 set;5 reps Heel Slides: 10 reps Prone  Hamstring Curl: 20 reps;Limitations Hamstring Curl Limitations: After manual isometrics to encourage end range strength  Manual Therapy Manual Therapy: Joint mobilization Joint Mobilization: Grade II-IV joint mobs in prone to improve flexion w/PROM after.  Isometrics holds w/manual assistance at end range of knee flexion x10 minutes Cryotherapy Number Minutes Cryotherapy: 10 Minutes Cryotherapy Location: Knee Type of Cryotherapy: Ice pack  Physical Therapy Assessment and Plan PT Assessment and Plan Clinical Impression Statement: Pt has improved functional strength with activities however continues to ambulate with moderate antalgic gait. She had notable increase in knee flexion after manual and isometric techniques.  PT Plan: Cont to progress strength and gait mechanics     Problem List Patient Active Problem List  Diagnoses  . HTN (hypertension)  . Pure hypercholesterolemia  . Aortic stenosis  . Dyspnea  . OA (osteoarthritis) of knee  . Arthritis of knee,  right  . Constipated  . S/P total knee replacement  . Difficulty in walking  . Stiffness of joint, not elsewhere classified, lower leg  . Pain in joint, lower leg  . Bursitis of shoulder, right  . Gastrocnemius strain    PT - End of Session Activity Tolerance: Patient tolerated treatment well;Other (comment) (new therex well)  Granville Whitefield 08/26/2011, 2:35 PM

## 2011-08-28 ENCOUNTER — Ambulatory Visit (HOSPITAL_COMMUNITY)
Admission: RE | Admit: 2011-08-28 | Discharge: 2011-08-28 | Disposition: A | Discharge status: Home / Self Care | Payer: Medicare HMO | Source: Ambulatory Visit | Attending: Internal Medicine | Admitting: Internal Medicine

## 2011-08-28 NOTE — Progress Notes (Signed)
Physical Therapy Treatment Patient Details  Name: Kelli Stone MRN: [id number] Date of Birth: [birth year]  Today's Date: 08/28/2011 Time: 4098-1191 Time Calculation (min): 61 min Visit#: 8  of 12   Re-eval: 09/09/11 Charges:  Therex 45', ice 10'    Subjective: Symptoms/Limitations Symptoms: Pt. states it's swelling a little more since last visit.  States it goes down with elevation and ice. Pain Assessment Currently in Pain?: No/denies   Exercise/Treatments Aerobic Stationary Bike: 6' 3.0 Machines for Strengthening Cybex Knee Extension: 1.5 PL 2x10 Cybex Knee Flexion: 3 PL 2x10 Standing Heel Raises: 20 reps Lateral Step Up: 15 reps;Step Height: 6" Forward Step Up: 15 reps;Step Height: 6" Step Down: 10 reps;Step Height: 6" Functional Squat: 20 reps SLS: RLE 15" max 19f 5 Supine Heel Slides: 20 reps Other Supine Knee Exercises: PROM for extension/flexion Sidelying Hip ABduction: 20 reps Prone  Hamstring Curl: 20 reps;Limitations Hamstring Curl Limitations: with manual assist to keep R hip down Hip Extension: 15 reps Other Prone Exercises: TKE 20x5 sec hold   Modalities Modalities: Cryotherapy Cryotherapy Number Minutes Cryotherapy: 10 Minutes Cryotherapy Location: Knee Type of Cryotherapy: Ice pack  Physical Therapy Assessment and Plan PT Assessment and Plan Clinical Impression Statement: Pt. encouraged to ice and elevate more throughout the day to decrease swelling.  Able to increase to 6" step today without difficulty, however pt. with difficulty lifting 2PL with quads having to decrease to 1.5PL. PT Plan: Continue to progress.  Attempt stairwell, reciprocally next visit.     Problem List Patient Active Problem List  Diagnoses  . HTN (hypertension)  . Pure hypercholesterolemia  . Aortic stenosis  . Dyspnea  . OA (osteoarthritis) of knee  . Arthritis of knee, right  . Constipated  . S/P total knee replacement  . Difficulty in walking  . Stiffness of  joint, not elsewhere classified, lower leg  . Pain in joint, lower leg  . Bursitis of shoulder, right  . Gastrocnemius strain    PT - End of Session Activity Tolerance: Patient tolerated treatment well General Behavior During Session: [facility] for tasks performed Cognition: [facility] for tasks performed  Emeline Gins B 08/28/2011, 1:59 PM

## 2011-08-30 ENCOUNTER — Ambulatory Visit (HOSPITAL_COMMUNITY)
Admission: RE | Admit: 2011-08-30 | Discharge: 2011-08-30 | Disposition: A | Discharge status: Home / Self Care | Payer: Medicare HMO | Source: Ambulatory Visit | Attending: Internal Medicine | Admitting: Internal Medicine

## 2011-08-30 NOTE — Progress Notes (Signed)
Physical Therapy Treatment Patient Details  Name: Kelli Stone MRN: [id number] Date of Birth: Oct 05, 1930  Today's Date: 08/30/2011 Time: 7846-9629 Time Calculation (min): 50 min Charges: 10 manual, 40 there ex Visit#: 9  of 12   Re-eval: 09/09/11    Subjective: Symptoms/Limitations Symptoms: Patient reports that heat is helping with the swelling Pain Assessment Currently in Pain?: No/denies Pain Score: 0-No pain Pain Location: Knee Pain Orientation: Right Multiple Pain Sites: No  Exercise/Treatments  Aerobic Stationary Bike: 6' 3.0, seat 8 Machines for Strengthening Cybex Knee Extension: 1.5 PL 2 x 10 reps Cybex Knee Flexion: 3 PL 2 x  10 Standing Heel Raises: 20 reps Lateral Step Up: 15 reps;Step Height: 6" Forward Step Up: 15 reps;Step Height: 6" Step Down: 15 reps;Step Height: 6" Functional Squat: 20 reps Rocker Board: 1 minute;Limitations Rocker Board Limitations: holding on with both hands SLS: RLE 25", LLE 20" only one rep each Other Standing Knee Exercises: toe raises 20 reps Seated Long Arc Quad: 10 reps;Limitations Long Arc Quad Limitations: 5 second holds Supine Heel Slides: 20 reps Other Supine Knee Exercises: PROM for extension/flexion  Modalities Modalities: Cryotherapy Cryotherapy Number Minutes Cryotherapy: 10 Minutes Cryotherapy Location: Knee Type of Cryotherapy: Ice pack  Physical Therapy Assessment and Plan PT Assessment and Plan Clinical Impression Statement: The patient is progressing well.  Still limited end ROM extension and flexion, but she seems satisfied with her progress and is not having much pain.  Swelling is being managed with heat at home.   PT Plan: Continue with current POC.  Do ther ext that was missed due to time this session at her next visit.       Problem List Patient Active Problem List  Diagnoses  . HTN (hypertension)  . Pure hypercholesterolemia  . Aortic stenosis  . Dyspnea  . OA (osteoarthritis) of knee  .  Arthritis of knee, right  . Constipated  . S/P total knee replacement  . Difficulty in walking  . Stiffness of joint, not elsewhere classified, lower leg  . Pain in joint, lower leg  . Bursitis of shoulder, right  . Gastrocnemius strain    PT - End of Session Activity Tolerance: Patient tolerated treatment well  Kelten Enochs B. Marlena Barbato, PT, DPT [phone]  08/30/2011, 1:53 PM

## 2011-09-02 ENCOUNTER — Ambulatory Visit (HOSPITAL_COMMUNITY)
Admission: RE | Admit: 2011-09-02 | Discharge: 2011-09-02 | Disposition: A | Discharge status: Home / Self Care | Payer: Medicare HMO | Source: Ambulatory Visit | Attending: Internal Medicine | Admitting: Internal Medicine

## 2011-09-02 NOTE — Progress Notes (Addendum)
Physical Therapy Treatment Patient Details  Name: Kelli Stone MRN: [id number] Date of Birth: 12/31/1930  Today's Date: 09/02/2011 Time: 3086-5784 Time Calculation (min): 41 min Charges: manual 10 mins, there ex 26 mins, ice 5 mins (patient wanted to leave early, so could not ice 10 mins today) Visit#: 10  of 12   Re-eval: 09/09/11    Subjective: Symptoms/Limitations Symptoms: Knee is pain free, tooth is hurting in her mouth.   Pain Assessment Pain Score:   9 Pain Location: Other (Comment) (tooth) Pain Orientation: Left  Exercise/Treatments Aerobic Stationary Bike: 5' 2.0, seat 8 (patient limited by tooth pain) Machines for Strengthening Cybex Knee Extension: 1.5 PL 2 x 10 reps Cybex Knee Flexion: 3 PL 2 x  10 Standing Heel Raises: 20 reps Supine Heel Slides: 20 reps Other Supine Knee Exercises: PROM for extension/flexion Sidelying Hip ABduction: 20 reps Hip ABduction Limitations: 3# Prone  Hamstring Curl: 20 reps Hamstring Curl Limitations: 3# Hip Extension: 15 reps Other Prone Exercises: TKE 20x5 sec hold  Cryotherapy: ice x5 mins in supine (patient had to leave early and could not do the full 10 mins)  Physical Therapy Assessment and Plan PT Assessment and Plan Clinical Impression Statement: Patient continues to have a lot of stiffness when she sits still for long periods of time and has to get up.  She is progressing well with all of her exercises.  PT Plan: Continue with current POC, re eval next week.      Problem List Patient Active Problem List  Diagnoses  . HTN (hypertension)  . Pure hypercholesterolemia  . Aortic stenosis  . Dyspnea  . OA (osteoarthritis) of knee  . Arthritis of knee, right  . Constipated  . S/P total knee replacement  . Difficulty in walking  . Stiffness of joint, not elsewhere classified, lower leg  . Pain in joint, lower leg  . Bursitis of shoulder, right  . Gastrocnemius strain    PT - End of Session Activity Tolerance:  Patient tolerated treatment well  Maansi Wike B. Shelsie Tijerino, PT, DPT [phone]  09/02/2011, 5:07 PM

## 2011-09-04 ENCOUNTER — Ambulatory Visit (HOSPITAL_COMMUNITY)
Admission: RE | Admit: 2011-09-04 | Discharge: 2011-09-04 | Disposition: A | Discharge status: Home / Self Care | Payer: Medicare HMO | Source: Ambulatory Visit | Attending: Internal Medicine | Admitting: Internal Medicine

## 2011-09-04 NOTE — Progress Notes (Signed)
Physical Therapy Treatment Patient Details  Name: Kelli Stone MRN: [id number] Date of Birth: Oct 17, 1930  Today's Date: 09/04/2011 Time: 7829-5621 Time Calculation (min): 33 min Charges: 33 TE Visit#: 11  of 12   Re-eval: 09/09/11    Subjective: Symptoms/Limitations Symptoms: When she sits for a long time and has to get up her knee is very stiff.   Pain Assessment Currently in Pain?: No/denies  Exercise/Treatments Aerobic Stationary Bike: 5', 2.0 seat 10 (pain and spasm in right hip) Machines for Strengthening Cybex Knee Extension: 1.5 PL 2 x 10 Cybex Knee Flexion: 2 PL 2 x 10  Standing Heel Raises: 20 reps Knee Flexion: 15 reps Lateral Step Up: 15 reps;Hand Hold: 2;Step Height: 6" Forward Step Up: 15 reps;Hand Hold: 2;Step Height: 6" Step Down: 15 reps;Hand Hold: 2;Step Height: 6" Rocker Board: 2 minutes Rocker Board Limitations: holding with bil hands on bars Other Standing Knee Exercises: toe raises 20 reps Seated Long Arc Quad: 15 reps;Weights Long Arc Quad Weight: 3 lbs. Supine Other Supine Knee Exercises: PROM for extension/flexion 3x 30 seconds each Sidelying Hip ABduction: 20 reps Hip ABduction Limitations: 3#  Physical Therapy Assessment and Plan PT Assessment and Plan Clinical Impression Statement: Patient had some increased pain with bike today likely becuse we didn't get to do it first secondary to another patient was on it.   PT Plan: The patient is progressing well, re-eval next visit for d/c from therapy.  Make sure she has a comprehensive HEP.       Problem List Patient Active Problem List  Diagnoses  . HTN (hypertension)  . Pure hypercholesterolemia  . Aortic stenosis  . Dyspnea  . OA (osteoarthritis) of knee  . Arthritis of knee, right  . Constipated  . S/P total knee replacement  . Difficulty in walking  . Stiffness of joint, not elsewhere classified, lower leg  . Pain in joint, lower leg  . Bursitis of shoulder, right  .  Gastrocnemius strain    PT - End of Session Activity Tolerance: Patient tolerated treatment well  Mylinh Cragg B. Margherita Collyer, PT, DPT [phone]  09/04/2011, 3:46 PM

## 2011-09-05 ENCOUNTER — Ambulatory Visit (HOSPITAL_COMMUNITY): Payer: Medicare HMO

## 2011-09-05 ENCOUNTER — Telehealth (HOSPITAL_COMMUNITY): Payer: Self-pay

## 2011-09-09 ENCOUNTER — Ambulatory Visit (HOSPITAL_COMMUNITY)
Admission: RE | Admit: 2011-09-09 | Discharge: 2011-09-09 | Disposition: A | Discharge status: Home / Self Care | Payer: Medicare HMO | Source: Ambulatory Visit | Attending: Internal Medicine | Admitting: Internal Medicine

## 2011-09-09 NOTE — Progress Notes (Signed)
Physical Therapy Re-Evaluation  Patient Details  Name: Kelli Stone MRN: [id number] Date of Birth: Apr 07, 1931  Today's Date: 09/09/2011 Time: 1233-1257 Time Calculation (min): 24 min Charges: 1 re-eval, 9 TE Visit#: 12  of 12   Re-eval: 09/09/11 Assessment Diagnosis: R TKA Surgical Date: 07/01/11 Next MD Visit: 10/01/10 Prior Therapy: [facility]  Subjective Symptoms/Limitations Symptoms: "The only problem I have is the swelling" "Can I wear support hose?" How long can you sit comfortably?: no problems How long can you stand comfortably?: standing for ~30 mins because her leg starts swelling.   How long can you walk comfortably?: no assistive device 1 hour Pain Assessment Currently in Pain?: No/denies Pain Score: 0-No pain Pain Location: Knee Pain Orientation: Right  Assessment ( )= last filed value RLE AROM (degrees) Right Knee Extension 0-130: -5 (-12) Right Knee Flexion 0-140: 97 (85) RLE PROM (degrees) Right Knee Extension 0-130: -5 (-5) Right Knee Flexion 0-140: 107 (92) RLE Strength ( )= last filed value Right Hip Flexion: 5/5 (3+/5) Right Hip Extension: 5/5 (3+/5) Right Hip ABduction: 4/5 (3+/5) Right Hip ADduction: 5/5 (3/5) Right Knee Flexion: 5/5 (3/5) Right Knee Extension: 5/5 (3/5)  Exercise/Treatments (patient wanted to leave early, so only bike and re-eval performed) Aerobic Stationary Bike: 5', 2.0, seat 10  Physical Therapy Assessment and Plan  Clinical Impression Statement: Re-evaluation performed.  Patient has improved strength to [facility] except some mild strength deficits on her right hip abductors (4/5).  She has met 2/3 STGs and 1/2 LTGs.  She is still lacking 5 degrees of extension and ~20 degrees of flexion.  She would like to be discharged from PT because she is happy with her overall progress.  She will check with the doctor at her next visit in January and see what he thinks.  We will keep her file open and see if he wants [place] to continue or discharge  this patient.   Rehab Potential: Good PT Frequency: Min 3X/week PT Duration: 4 weeks PT Treatment/Interventions: DME instruction;Gait training;Stair training;Functional mobility training;Therapeutic activities;Therapeutic exercise;Balance training;Neuromuscular re-education;Patient/family education;Other (comment) (modalities for pain, joint mobs for ROM) PT Plan: D/c if MD approves, otherwise continue 3xs/wk x4 more weeks.      Goals Home Exercise Program Pt will Perform Home Exercise Program: Independently PT Goal: Perform Home Exercise Program - Progress: Met PT Short Term Goals PT Short Term Goal 1 - Progress: Met PT Short Term Goal 2 - Progress: Met PT Short Term Goal 3 - Progress: Not met PT Long Term Goals PT Long Term Goal 1 - Progress: Not met PT Long Term Goal 2 - Progress: Met  PT - End of Session Activity Tolerance: Patient tolerated treatment well General Behavior During Session: [facility] for tasks performed Cognition: [facility] for tasks performed   Megham Dwyer B. Wing Schoch, PT, DPT  09/09/2011, 2:44 PM  Physician Documentation Your signature is required to indicate approval of the treatment plan as stated above.  Please sign and either send electronically or make a copy of this report for your files and return this physician signed original.   Please mark one 1.__approve of plan  2. ___approve of plan with the following conditions.   ______________________________  _____________________ Physician Signature                                                                                                             Date

## 2011-09-11 ENCOUNTER — Ambulatory Visit (HOSPITAL_COMMUNITY): Payer: Medicare HMO

## 2011-09-19 ENCOUNTER — Ambulatory Visit (INDEPENDENT_AMBULATORY_CARE_PROVIDER_SITE_OTHER): Payer: Medicare HMO | Admitting: Orthopedic Surgery

## 2011-09-19 ENCOUNTER — Encounter: Payer: Self-pay | Admitting: Orthopedic Surgery

## 2011-09-19 ENCOUNTER — Other Ambulatory Visit: Payer: Self-pay | Admitting: Orthopedic Surgery

## 2011-09-19 DIAGNOSIS — I82409 Acute embolism and thrombosis of unspecified deep veins of unspecified lower extremity: Secondary | ICD-10-CM

## 2011-09-19 DIAGNOSIS — B999 Unspecified infectious disease: Secondary | ICD-10-CM

## 2011-09-19 NOTE — Patient Instructions (Signed)
Go to hospital tomorrow for Ultrasound  Go to lab for cbc esr and c reactive protein

## 2011-09-19 NOTE — Progress Notes (Signed)
Patient ID: Kelli Stone, female   DOB: 1930/11/16, 78 y.o.   MRN: [id number] Chief complaint swelling, RIGHT knee, and calf pain.  The patient complains of swelling of the entire RIGHT lower extremity despite wearing an over-the-counter compression fashion stocking. She'll complains of warmth in the knee. She completed her physical therapy, so she was doing fine, then all of a sudden, the knee got warm, and started to swell and progressed to include swelling of the entire RIGHT lower extremity down to the ankle and foot.  She does not complain of pain other than that in the back of the calf.  Examination reveals a well-healed incision. Her range of motion is 3-110. There is no joint effusion. There is no pain with passive range of motion of the knee. She has intact extension with good power and stability, and strength.  The knee is stable. Anteroposterior and mediolateral.  Definitive diagnosis. Routine postop swelling 3 months or slightly less than 3 months after surgery. DVT. Infection.  Plan Although there is a low risk of DVT. Right now we will still get the ultrasound rule out a DVT.  Risk of infection is very low. We will get a CBC, sedimentation rate, and C-reactive protein to rule out infection.

## 2011-09-20 ENCOUNTER — Ambulatory Visit (HOSPITAL_COMMUNITY)
Admission: RE | Admit: 2011-09-20 | Discharge: 2011-09-20 | Disposition: A | Discharge status: Home / Self Care | Payer: Medicare HMO | Source: Ambulatory Visit | Attending: Orthopedic Surgery | Admitting: Orthopedic Surgery

## 2011-09-20 DIAGNOSIS — M7989 Other specified soft tissue disorders: Secondary | ICD-10-CM | POA: Insufficient documentation

## 2011-09-20 DIAGNOSIS — I82409 Acute embolism and thrombosis of unspecified deep veins of unspecified lower extremity: Secondary | ICD-10-CM

## 2011-09-20 DIAGNOSIS — M79609 Pain in unspecified limb: Secondary | ICD-10-CM | POA: Insufficient documentation

## 2011-09-20 DIAGNOSIS — Z96659 Presence of unspecified artificial knee joint: Secondary | ICD-10-CM | POA: Insufficient documentation

## 2011-09-20 LAB — CBC WITH DIFFERENTIAL/PLATELET
Basophils Absolute: 0 10*3/uL (ref 0.0–0.1)
Basophils Relative: 0 % (ref 0–1)
Eosinophils Absolute: 0.1 10*3/uL (ref 0.0–0.7)
HCT: 34.2 % — ABNORMAL LOW (ref 36.0–46.0)
Hemoglobin: 11 g/dL — ABNORMAL LOW (ref 12.0–15.0)
MCH: 25.6 pg — ABNORMAL LOW (ref 26.0–34.0)
MCHC: 32.2 g/dL (ref 30.0–36.0)
Monocytes Absolute: 0.8 10*3/uL (ref 0.1–1.0)
Monocytes Relative: 11 % (ref 3–12)
Neutrophils Relative %: 51 % (ref 43–77)
RDW: 13.8 % (ref 11.5–15.5)

## 2011-09-20 NOTE — Progress Notes (Signed)
Quick Note:  Reviewed. ______ 

## 2011-10-02 ENCOUNTER — Ambulatory Visit (INDEPENDENT_AMBULATORY_CARE_PROVIDER_SITE_OTHER): Payer: Medicare HMO | Admitting: Orthopedic Surgery

## 2011-10-02 ENCOUNTER — Encounter: Payer: Self-pay | Admitting: Orthopedic Surgery

## 2011-10-02 VITALS — BP 140/70 | Ht 63.0 in | Wt 147.0 lb

## 2011-10-02 DIAGNOSIS — Z96659 Presence of unspecified artificial knee joint: Secondary | ICD-10-CM

## 2011-10-02 DIAGNOSIS — I82409 Acute embolism and thrombosis of unspecified deep veins of unspecified lower extremity: Secondary | ICD-10-CM

## 2011-10-02 DIAGNOSIS — G5790 Unspecified mononeuropathy of unspecified lower limb: Secondary | ICD-10-CM

## 2011-10-02 MED ORDER — HYDROCODONE-ACETAMINOPHEN 5-325 MG PO TABS: 1.0000 | ORAL_TABLET | Freq: Four times a day (QID) | ORAL | Status: AC | PRN

## 2011-10-02 MED ORDER — GABAPENTIN 100 MG PO CAPS: 100.0000 mg | ORAL_CAPSULE | Freq: Three times a day (TID) | ORAL | Status: DC

## 2011-10-02 NOTE — Patient Instructions (Addendum)
Start new medication for pinched nerve in right leg: aka Neuropathy  Take 1 tab 3 x a day

## 2011-10-02 NOTE — Progress Notes (Addendum)
Patient ID: Kelli Stone, female   DOB: [birth year], 78 y.o.   MRN: [id number] Surgery 07/01/11 (- 10/01/11 90 days)   Recent laboratory studies performed to evaluate for infection. ESR and C-reactive protein were essentially normal with a normal white count.  Careful questioning, it history, review that. She is really complaining of lateral leg pain with numbness and tingling from the knee down into the foot.  This area is tender for her. There is pain behind the knee. She does not quite take full extension. Her flexion arc is 110. The knee is stable in extension, as well as flexion. There is decreased sensation in the lateral border of the leg and dorsum of the foot. Vascular exam is otherwise normal. She is ambulating with no assistive device.  An x-ray was ordered and taken in the office, which showed no implant abnormalities.  Diagnosis neuropathy.  Recommended 100 mg of Neurontin 3 times a day for 6 weeks and then reevaluate.  X-ray report 3 views RIGHT knee.  Alignment. Slight varus. Loosening none.  Implants stable slight varus of the tibial tray.

## 2011-10-02 NOTE — Assessment & Plan Note (Signed)
[place] for vein thrombosis was normal

## 2011-11-13 ENCOUNTER — Encounter: Payer: Self-pay | Admitting: Orthopedic Surgery

## 2011-11-13 ENCOUNTER — Ambulatory Visit (INDEPENDENT_AMBULATORY_CARE_PROVIDER_SITE_OTHER): Payer: Medicare HMO | Admitting: Orthopedic Surgery

## 2011-11-13 VITALS — BP 124/60 | Ht 63.0 in | Wt 147.0 lb

## 2011-11-13 DIAGNOSIS — M4800 Spinal stenosis, site unspecified: Secondary | ICD-10-CM | POA: Insufficient documentation

## 2011-11-13 MED ORDER — METHOCARBAMOL 500 MG PO TABS: 500.0000 mg | ORAL_TABLET | Freq: Four times a day (QID) | ORAL | Status: AC

## 2011-11-13 MED ORDER — PREDNISONE 5 MG PO KIT: 5.0000 mg | PACK | ORAL | Status: DC

## 2011-11-13 MED ORDER — HYDROCODONE-ACETAMINOPHEN 5-325 MG PO TABS: 1.0000 | ORAL_TABLET | Freq: Four times a day (QID) | ORAL | Status: AC | PRN

## 2011-11-13 NOTE — Patient Instructions (Signed)
You have been scheduled for an MRI scan.  Your insurance company requires advocate precertification prior to scheduling the MRI.  If her MRI scan is not improved we will let you know and make further treatment recommendations according to your insurance's guidelines.   We will schedule you for a number of appointment to review the results and make further treatment recommendations 

## 2011-11-13 NOTE — Progress Notes (Signed)
Patient ID: Kelli Stone, female   DOB: 05-Jul-1931, 78 y.o.   MRN: [id number] Chief Complaint  Patient presents with  . Follow-up    6 week recheck of leg neuropathy.     The patient is continuing to complain of radicular pain in her RIGHT leg associated with pain along the lateral side of the leg and radiating into the RIGHT hip with weakness and giving way unresponsive to Neurontin and hydrocodone.  She's had pain now for over 4 months. Review of Systems  Genitourinary: Negative for dysuria and urgency.  Neurological: Positive for tingling.     Her exam shows weakness in the RIGHT lower extremity with decreased sensation in the L5 root.  She has a positive straight leg raise at 45 and she is noticeably limping.  Her RIGHT knee replacement looks good she has a normal incision 120 of flexion full extension of the knee as well.  Her knee is stable and her vascular exam is normal.  Impression herniated disc, spinal stenosis, back pain with radiculopathy  Recommended MRI.  Recommend prednisone Dosepak, continue Neurontin as well as hydrocodone and Robaxin.  Followup after MRI

## 2011-11-22 ENCOUNTER — Telehealth: Payer: Self-pay | Admitting: Radiology

## 2011-11-22 NOTE — Telephone Encounter (Signed)
I called to give the patient her MRI appointment at [facility] on 11-28-11 at 10:30. Patient has [place], authorization [id number]. Patient will follow up here for her results.

## 2011-11-28 ENCOUNTER — Ambulatory Visit
Admission: RE | Admit: 2011-11-28 | Discharge: 2011-11-28 | Disposition: A | Discharge status: Home / Self Care | Payer: Medicare HMO | Source: Ambulatory Visit | Attending: Orthopedic Surgery | Admitting: Orthopedic Surgery

## 2011-11-28 DIAGNOSIS — M4800 Spinal stenosis, site unspecified: Secondary | ICD-10-CM

## 2011-12-02 ENCOUNTER — Telehealth: Payer: Self-pay | Admitting: Radiology

## 2011-12-02 NOTE — Telephone Encounter (Signed)
Patient has [employer], authorization # [id number]  For her MRI of lumbar spine.

## 2011-12-04 ENCOUNTER — Ambulatory Visit: Payer: Medicare HMO | Admitting: Orthopedic Surgery

## 2011-12-04 ENCOUNTER — Encounter: Payer: Self-pay | Admitting: Orthopedic Surgery

## 2011-12-04 ENCOUNTER — Ambulatory Visit (INDEPENDENT_AMBULATORY_CARE_PROVIDER_SITE_OTHER): Payer: Medicare HMO | Admitting: Orthopedic Surgery

## 2011-12-04 VITALS — BP 130/70 | Ht 63.0 in | Wt 147.0 lb

## 2011-12-04 DIAGNOSIS — M4800 Spinal stenosis, site unspecified: Secondary | ICD-10-CM

## 2011-12-04 NOTE — Patient Instructions (Signed)
Epidural series. At L5, S1  (Try [place] 1st if insur issue then [facility])

## 2011-12-04 NOTE — Progress Notes (Signed)
Patient ID: Kelli Stone, female   DOB: 1931/08/05, 77 y.o.   MRN: [id number] Chief Complaint  Patient presents with  . Results    mri review L spine    MRI shows that the patient has an L5-S1 spinal stenosis and would be beneficial for her epidural series,  She's been a bit out of town for the next week, so we'll have to arrange it. The week after and then we will see her in about 8 weeks after her 2nd injection.

## 2011-12-05 ENCOUNTER — Encounter: Payer: Self-pay | Admitting: *Deleted

## 2011-12-05 NOTE — Progress Notes (Signed)
Patient ID: Kelli Stone, female   DOB: 11-24-1930, 78 y.o.   MRN: [id number] A referral to Dr Eduard Clos for epidural injections has been faxed with: Their referral form Our referral order [employer] Insurance cards Last ov note Mri report

## 2011-12-09 ENCOUNTER — Encounter: Payer: Self-pay | Admitting: *Deleted

## 2011-12-09 NOTE — Progress Notes (Signed)
Patient ID: Kelli Stone, female   DOB: 1930-09-23, 78 y.o.   MRN: [id number] Dr Eduard Clos office does not accept patients insurance Sent referral to [facility]

## 2011-12-10 ENCOUNTER — Other Ambulatory Visit: Payer: Self-pay | Admitting: Orthopedic Surgery

## 2011-12-10 DIAGNOSIS — M4800 Spinal stenosis, site unspecified: Secondary | ICD-10-CM

## 2011-12-18 ENCOUNTER — Ambulatory Visit
Admission: RE | Admit: 2011-12-18 | Discharge: 2011-12-18 | Disposition: A | Discharge status: Home / Self Care | Payer: Medicare HMO | Source: Ambulatory Visit | Attending: Orthopedic Surgery | Admitting: Orthopedic Surgery

## 2011-12-18 VITALS — BP 180/75 | HR 63

## 2011-12-18 DIAGNOSIS — M4800 Spinal stenosis, site unspecified: Secondary | ICD-10-CM

## 2011-12-18 MED ORDER — METHYLPREDNISOLONE ACETATE 40 MG/ML INJ SUSP (RADIOLOG
120.0000 mg | Freq: Once | INTRAMUSCULAR | Status: AC
  Administered 2011-12-18: 120 mg via EPIDURAL

## 2011-12-18 MED ORDER — IOHEXOL 180 MG/ML  SOLN
1.0000 mL | Freq: Once | INTRAMUSCULAR | Status: AC | PRN
  Administered 2011-12-18: 1 mL via EPIDURAL

## 2011-12-18 MED ORDER — DIAZEPAM 5 MG PO TABS
5.0000 mg | ORAL_TABLET | Freq: Once | ORAL | Status: AC
  Administered 2011-12-18: 5 mg via ORAL

## 2011-12-18 NOTE — Discharge Instructions (Signed)

## 2012-01-10 ENCOUNTER — Encounter (HOSPITAL_COMMUNITY): Payer: Self-pay | Admitting: *Deleted

## 2012-01-10 ENCOUNTER — Telehealth: Payer: Self-pay | Admitting: Orthopedic Surgery

## 2012-01-10 ENCOUNTER — Emergency Department (HOSPITAL_COMMUNITY)
Admission: EM | Admit: 2012-01-10 | Discharge: 2012-01-10 | Disposition: A | Discharge status: Home / Self Care | Payer: Medicare HMO | Attending: Emergency Medicine | Admitting: Emergency Medicine

## 2012-01-10 DIAGNOSIS — I10 Essential (primary) hypertension: Secondary | ICD-10-CM | POA: Insufficient documentation

## 2012-01-10 DIAGNOSIS — H539 Unspecified visual disturbance: Secondary | ICD-10-CM | POA: Insufficient documentation

## 2012-01-10 DIAGNOSIS — M1990 Unspecified osteoarthritis, unspecified site: Secondary | ICD-10-CM | POA: Insufficient documentation

## 2012-01-10 DIAGNOSIS — M25569 Pain in unspecified knee: Secondary | ICD-10-CM

## 2012-01-10 DIAGNOSIS — Z96659 Presence of unspecified artificial knee joint: Secondary | ICD-10-CM | POA: Insufficient documentation

## 2012-01-10 DIAGNOSIS — R109 Unspecified abdominal pain: Secondary | ICD-10-CM | POA: Insufficient documentation

## 2012-01-10 DIAGNOSIS — R011 Cardiac murmur, unspecified: Secondary | ICD-10-CM | POA: Insufficient documentation

## 2012-01-10 MED ORDER — PROMETHAZINE HCL 12.5 MG PO TABS
12.5000 mg | ORAL_TABLET | Freq: Once | ORAL | Status: AC
  Administered 2012-01-10: 12.5 mg via ORAL
  Filled 2012-01-10: qty 1

## 2012-01-10 MED ORDER — PREDNISONE 20 MG PO TABS
60.0000 mg | ORAL_TABLET | Freq: Once | ORAL | Status: AC
  Administered 2012-01-10: 60 mg via ORAL
  Filled 2012-01-10: qty 1

## 2012-01-10 MED ORDER — PREDNISONE 10 MG PO TABS: ORAL_TABLET | ORAL | Status: DC

## 2012-01-10 MED ORDER — HYDROCODONE-ACETAMINOPHEN 7.5-325 MG PO TABS: 1.0000 | ORAL_TABLET | ORAL | Status: AC | PRN

## 2012-01-10 MED ORDER — HYDROCODONE-ACETAMINOPHEN 5-325 MG PO TABS
2.0000 | ORAL_TABLET | Freq: Once | ORAL | Status: AC
  Administered 2012-01-10: 2 via ORAL
  Filled 2012-01-10: qty 2

## 2012-01-10 NOTE — ED Notes (Signed)
Pt presents to er with bilateral knee pain , states that both her knees have been hurting all week but the pain is worse this am to the point that she had a hard time getting out of bed, pt denies any injury.

## 2012-01-10 NOTE — Telephone Encounter (Signed)
Kelli Stone called this morning complaining of severe pain in both knees, says she cannot get out of bed today.  Told her Dr. Romeo Apple is in surgery Today and most of the day Monday, and advised her to call either her PCP or go to the ER. She seemed ok with this

## 2012-01-10 NOTE — ED Provider Notes (Signed)
Pt had right TKR done on Oct 15th by Dr Romeo Apple. Relates a lot of pain in her knee since she has stopped PT. States she belongs to the [facility]. No is having pain in her left knee, and indicates the medial aspect. States Dr Romeo Apple has xrays of that knee. States she needs TKR on the left but isn't ready to have that done yet b/o problems with her right.  Pt has well healed midline scar on the right knee. Has mild diffuse sweeling without effusion. Skin normal. Has no joint effusion on the left, tender over the medial left knee joint. Has some crepitance.   Medical screening examination/treatment/procedure(s) were conducted as a shared visit with non-physician practitioner(s) and myself.  I personally evaluated the patient during the encounter Devoria Albe, MD, Franz Dell, MD 01/10/12 [phone]

## 2012-01-10 NOTE — ED Provider Notes (Signed)
History     CSN: [id number]  Arrival date & time 01/10/12  1315   None     Chief Complaint  Patient presents with  . Knee Pain    (Consider location/radiation/quality/duration/timing/severity/associated sxs/prior treatment) HPI Comments: Patient reports history of severe arthritis in both knees. She hasn't had knee replacement on the right. She has complications from the surgery. The patient has recurrent pain of the right knee, states she was told that she should have knee replacements done, but she has not decided to have this done as of yet. Today she had severe pain that interfered with her activities of daily living. She presents to the emergency department. She was unable to see her primary physician for assistance with this discomfort. There's been no fall or injury to the right knee.  The history is provided by the patient.    Past Medical History  Diagnosis Date  . Glaucoma   . GERD (gastroesophageal reflux disease)   . Personal history of tobacco use, presenting hazards to health   . Personal history of allergy to penicillin   . Personal history of allergy to sulfonamides   . Vertigo     HISTORY OF  . Diabetes mellitus     diet controlled  . HTN (hypertension)   . Pure hypercholesterolemia     Past Surgical History  Procedure Date  . Appendectomy   . Right shoulder   . Shoulder surgery   . Breast surgery bilateral  . Vaginal hysterectomy   . Total knee arthroplasty 07/01/2011    Procedure: TOTAL KNEE ARTHROPLASTY;  Surgeon: Fuller Canada, MD;  Location: [facility];  Service: Orthopedics;  Laterality: Right;  Depuy    Family History  Problem Relation Age of Onset  . Cancer    . Diabetes      History  Substance Use Topics  . Smoking status: Former Smoker -- 0.2 packs/day for 15 years    Types: Cigarettes    Quit date: 09/17/1983  . Smokeless tobacco: Never Used  . Alcohol Use: No    OB History    Grav Para Term Preterm Abortions TAB SAB Ect Mult  Living                  Review of Systems  Constitutional: Negative for activity change.       All ROS Neg except as noted in HPI  HENT: Negative for nosebleeds and neck pain.   Eyes: Positive for visual disturbance. Negative for photophobia and discharge.  Respiratory: Negative for cough, shortness of breath and wheezing.   Cardiovascular: Negative for chest pain and palpitations.       Murmur  Gastrointestinal: Positive for abdominal pain. Negative for blood in stool.  Genitourinary: Negative for dysuria, frequency and hematuria.  Musculoskeletal: Positive for back pain, joint swelling and arthralgias.  Skin: Negative.   Neurological: Negative for dizziness, seizures and speech difficulty.  Psychiatric/Behavioral: Negative for hallucinations and confusion.    Allergies  Cephalexin; Other; Sulfa antibiotics; Zocor; Bonine; Celebrex; Depo-medrol (pf); Dilacor xr; Erythromycin; Lescol; Lotrel; Metformin and related; Naprosyn; Penicillins; Stadol; and Trinalin  Home Medications   Current Outpatient Rx  Name Route Sig Dispense Refill  . AMLODIPINE BESYLATE 5 MG PO TABS Oral Take 5 mg by mouth daily.      Marland Kitchen FERROUS FUMARATE 325 (106 FE) MG PO TABS Oral Take 1 tablet by mouth daily.     Marland Kitchen GABAPENTIN 100 MG PO CAPS Oral Take 100 mg by mouth 3 (three)  times daily. For Neuropathy/Nerve pain    . HYDROCODONE-ACETAMINOPHEN 5-325 MG PO TABS Oral Take 1 tablet by mouth every 6 (six) hours as needed. For pain    . LATANOPROST 0.005 % OP SOLN Both Eyes Place 1 drop into both eyes at bedtime.      Marland Kitchen LISINOPRIL 40 MG PO TABS Oral Take 40 mg by mouth daily.      Marland Kitchen OMEPRAZOLE 20 MG PO CPDR Oral Take 20 mg by mouth daily.    Bernadette Hoit SODIUM 8.6-50 MG PO TABS Oral Take 2 tablets by mouth daily as needed for constipation. 30 tablet 1  . TIMOLOL HEMIHYDRATE 0.25 % OP SOLN Both Eyes Place 1 drop into both eyes every morning.       BP 149/51  Pulse 68  Temp(Src) 98.1 F (36.7 C) (Oral)   Resp 17  Ht 5\' 2"  (1.575 m)  Wt 161 lb (73.029 kg)  BMI 29.45 kg/m2  SpO2 100%  Physical Exam  Nursing note and vitals reviewed. Constitutional: She is oriented to person, place, and time. She appears well-developed and well-nourished.  Non-toxic appearance.  HENT:  Head: Normocephalic.  Right Ear: Tympanic membrane and external ear normal.  Left Ear: Tympanic membrane and external ear normal.  Eyes: EOM and lids are normal. Pupils are equal, round, and reactive to light.  Neck: Normal range of motion. Neck supple. Carotid bruit is not present.  Cardiovascular: Normal rate, regular rhythm, intact distal pulses and normal pulses.   Murmur heard. Pulmonary/Chest: Breath sounds normal. No respiratory distress.  Abdominal: Soft. Bowel sounds are normal. There is no tenderness. There is no guarding.  Musculoskeletal: Normal range of motion.       Slight decrease in range of motion of right and left hips. There is a well-healed surgical scar from knee replacement on the left. The left knee and anterior tibial area are slightly warm, but not hot. The right knee shows crepitus on the attempted examination. There is pain to palpation and attempted range of motion. The distal pulses are symmetrical. There is no joint effusion appreciated.  Lymphadenopathy:       Head (right side): No submandibular adenopathy present.       Head (left side): No submandibular adenopathy present.    She has no cervical adenopathy.  Neurological: She is alert and oriented to person, place, and time. She has normal strength. No cranial nerve deficit or sensory deficit.  Skin: Skin is warm and dry.  Psychiatric: She has a normal mood and affect. Her speech is normal.    ED Course  Procedures (including critical care time)  Labs Reviewed - No data to display No results found.   No diagnosis found.    MDM  I have reviewed nursing notes, vital signs, and all appropriate lab and imaging results for this  patient. Patient has history of advanced arthritis of the knees. There is no effusion noted of the right knee. There is crepitus present on my examination. Patient is treated with prednisone, and Norco. Patient is to see her primary physician, next week for additional treatment.       Kathie Dike, [place] 01/10/12 1642

## 2012-01-10 NOTE — ED Notes (Signed)
Bil knee pain for over a week.  Had knee replacement on rt .  Both knees hurt. No known injury .

## 2012-01-10 NOTE — Discharge Instructions (Signed)
Please use norco  With caution. This may cause drowsiness and constipation. Please see your MD next week for recheck.Arthralgia Your caregiver has diagnosed you as suffering from an arthralgia. Arthralgia means there is pain in a joint. This can come from many reasons including:  Bruising the joint which causes soreness (inflammation) in the joint.   Wear and tear on the joints which occur as we grow older (osteoarthritis).   Overusing the joint.   Various forms of arthritis.   Infections of the joint.  Regardless of the cause of pain in your joint, most of these different pains respond to anti-inflammatory drugs and rest. The exception to this is when a joint is infected, and these cases are treated with antibiotics, if it is a bacterial infection. HOME CARE INSTRUCTIONS   Rest the injured area for as long as directed by your caregiver. Then slowly start using the joint as directed by your caregiver and as the pain allows. Crutches as directed may be useful if the ankles, knees or hips are involved. If the knee was splinted or casted, continue use and care as directed. If an stretchy or elastic wrapping bandage has been applied today, it should be removed and re-applied every 3 to 4 hours. It should not be applied tightly, but firmly enough to keep swelling down. Watch toes and feet for swelling, bluish discoloration, coldness, numbness or excessive pain. If any of these problems (symptoms) occur, remove the ace bandage and re-apply more loosely. If these symptoms persist, contact your caregiver or return to this location.   For the first 24 hours, keep the injured extremity elevated on pillows while lying down.   Apply ice for 15 to 20 minutes to the sore joint every couple hours while awake for the first half day. Then 3 to 4 times per day for the first 48 hours. Put the ice in a plastic bag and place a towel between the bag of ice and your skin.   Wear any splinting, casting, elastic bandage  applications, or slings as instructed.   Only take over-the-counter or prescription medicines for pain, discomfort, or fever as directed by your caregiver. Do not use aspirin immediately after the injury unless instructed by your physician. Aspirin can cause increased bleeding and bruising of the tissues.   If you were given crutches, continue to use them as instructed and do not resume weight bearing on the sore joint until instructed.  Persistent pain and inability to use the sore joint as directed for more than 2 to 3 days are warning signs indicating that you should see a caregiver for a follow-up visit as soon as possible. Initially, a hairline fracture (break in bone) may not be evident on X-rays. Persistent pain and swelling indicate that further evaluation, non-weight bearing or use of the joint (use of crutches or slings as instructed), or further X-rays are indicated. X-rays may sometimes not show a small fracture until a week or 10 days later. Make a follow-up appointment with your own caregiver or one to whom we have referred you. A radiologist (specialist in reading X-rays) may read your X-rays. Make sure you know how you are to obtain your X-ray results. Do not assume everything is normal if you do not hear from [place]. SEEK MEDICAL CARE IF: Bruising, swelling, or pain increases. SEEK IMMEDIATE MEDICAL CARE IF:   Your fingers or toes are numb or blue.   The pain is not responding to medications and continues to stay the same  or get worse.   The pain in your joint becomes severe.   You develop a fever over 102 F (38.9 C).   It becomes impossible to move or use the joint.  MAKE SURE YOU:   Understand these instructions.   Will watch your condition.   Will get help right away if you are not doing well or get worse.  Document Released: 09/02/2005 Document Revised: 08/22/2011 Document Reviewed: 04/20/2008 [facility] Patient Information 2012 [place], [place].

## 2012-01-14 NOTE — Telephone Encounter (Signed)
Called her but did not get an answer, left message to call our office

## 2012-01-14 NOTE — Telephone Encounter (Signed)
Has she called back how is she doing ??

## 2012-02-27 ENCOUNTER — Encounter (INDEPENDENT_AMBULATORY_CARE_PROVIDER_SITE_OTHER): Payer: Self-pay | Admitting: *Deleted

## 2012-04-13 ENCOUNTER — Encounter (INDEPENDENT_AMBULATORY_CARE_PROVIDER_SITE_OTHER): Payer: Self-pay | Admitting: Internal Medicine

## 2012-04-13 ENCOUNTER — Ambulatory Visit (INDEPENDENT_AMBULATORY_CARE_PROVIDER_SITE_OTHER): Payer: Medicare PPO | Admitting: Internal Medicine

## 2012-04-13 ENCOUNTER — Telehealth (INDEPENDENT_AMBULATORY_CARE_PROVIDER_SITE_OTHER): Payer: Self-pay | Admitting: *Deleted

## 2012-04-13 ENCOUNTER — Other Ambulatory Visit (INDEPENDENT_AMBULATORY_CARE_PROVIDER_SITE_OTHER): Payer: Self-pay | Admitting: *Deleted

## 2012-04-13 VITALS — BP 134/80 | HR 76 | Temp 98.3°F | Resp 20 | Ht 63.0 in | Wt 154.2 lb

## 2012-04-13 DIAGNOSIS — Z8719 Personal history of other diseases of the digestive system: Secondary | ICD-10-CM

## 2012-04-13 DIAGNOSIS — D649 Anemia, unspecified: Secondary | ICD-10-CM

## 2012-04-13 DIAGNOSIS — Z1211 Encounter for screening for malignant neoplasm of colon: Secondary | ICD-10-CM

## 2012-04-13 NOTE — Telephone Encounter (Signed)
Patient needs movi prep 

## 2012-04-13 NOTE — Patient Instructions (Signed)
Colonoscopy to be scheduled. Please do not take iron pills for now.

## 2012-04-13 NOTE — Progress Notes (Signed)
Presenting complaint;  Recent admission for bloody diarrhea/colitis. Anemia. History of present illness; Patient is 78 year old African female who is referred through courtesy of Dr. Sherril Croon for GI evaluation. About 8 weeks ago she developed sudden onset of severe abdominal cramps followed by bloody diarrhea and she was briefly hospitalized at [facility] in [facility]. Stool studies were negative. She had abdominopelvic CT which showed thickening to descending colon she apparently also had on prior study of May 2009. That study was not done for rectal bleeding. Patient was treated with Cipro and feels much better. Her stools are now normal. There is no history of melena or rectal bleeding. She did require 2 units of PRBCs perioperative event she had right knee replacement in October 2012 but does not recall that she had melena or rectal bleeding. Her appetite is favored. She has lost 14 pounds since her knee surgery last year. She says her heartburn is well controlled with therapy and experienced only with certain foods. She denies dysphagia. She complains of recurrent right knee pain she said it feels warm. She also complains of back pain if she sits in a chair for long periods.   Current Medications: Current Outpatient Prescriptions  Medication Sig Dispense Refill  . amLODipine (NORVASC) 5 MG tablet Take 5 mg by mouth daily.        Marland Kitchen HYDROcodone-acetaminophen (NORCO) 5-325 MG per tablet Take 1 tablet by mouth every 6 (six) hours as needed. For pain      . latanoprost (XALATAN) 0.005 % ophthalmic solution Place 1 drop into both eyes at bedtime.        Marland Kitchen lisinopril (PRINIVIL,ZESTRIL) 40 MG tablet Take 40 mg by mouth daily.        Marland Kitchen omeprazole (PRILOSEC) 20 MG capsule Take 20 mg by mouth daily.      . timolol (BETIMOL) 0.25 % ophthalmic solution Place 1 drop into both eyes every morning.       . ferrous fumarate (HEMOCYTE - 106 MG FE) 325 (106 FE) MG TABS Take 1 tablet by mouth daily.        Past  medical history; Hypertension. Bilateral glaucoma. Chronic GERD. Last EGD was in August 2006 revealing small sliding hiatal hernia. She had colonoscopy in August 2007 for hematochezia and noted to have sigmoid diverticulosis and external hemorrhoids. Aortic stenosis. Spinal stenosis. Appendectomy at age 5. Hysterectomy in when she was in the mid 30s. She's had benign lumps removed from both breasts. Hemorrhoidectomy several years ago. Repair for right rotator cuff tear about 10 years ago. Right knee replacement and October 2012. Allergy and or intolerance. Meclizine, Lotrel, cephalexin, sulfa, Zocor, Depo-Medrol Celebrex, diltiazem, erythromycin, and Penicillin,Lescol, Naprosyn and metformin.  Family history; Father died of bone cancer age 42. Mother was diabetic and lived to be 5. One sister is living and she is not in good health. One sister died of colon carcinoma at 11 And other sister died at 44 of chronic kidney disease.  One brother died of ruptured intracranial aneurysm in his early 23s another brother died of bone cancer in his 78s  Social history ;  She is married and accompanied by her husband today. They have one son who is doing well except he has permanent . She smokes cigarettes off and on for a few years but quit over 30 years ago and she does not drink alcohol.  She worked at the [employer] for 5-6 years . Objective: Blood pressure 134/80, pulse 76, temperature 98.3 F (36.8 C),  temperature source Oral, resp. rate 20, height 5\' 3"  (1.6 m), weight 154 lb 3.2 oz (69.945 kg). Patient is alert and in no acute distress Conjunctiva is pink. Sclera is nonicteric Oropharyngeal mucosa is normal. She has complete upper and partial lower dental plate plate. No neck masses or thyromegaly noted. Cardiac exam with regular rhythm normal S1 and S2. She has grade 3/6 systolic ejection murmur best heard at aortic area. Lungs are clear to auscultation. Abdomen abdomen is full.  Bowel sounds are normal. On palpation soft abdomen without tenderness organomegaly or masses. Rectal examination reveals scant amount of stool in the vault which is guaiac-negative to  No LE edema or clubbing noted.  Labs/studies Results: Abdominopelvic CT from 02-2012 revealed diffuse thickening to descending colon apparently also seen in May 2009. Also had sigmoid diverticulosis and small hiatal hernia. CBC from 02/19/2012 WBC 5.3, H&H 10.3 and 31.4 MCV 81 and platelet count 310K  Assessment:  Patient is 78 year old African female who presents with a recent bout of bloody diarrhea and anemia and her symptoms have responded to empiric antibiotic therapy. Her stool studies were negative. Her stool is guaiac negative which is reassuring. Family history is positive for colon carcinoma in her sister who was around 61 years old. Patient's last colonoscopy was in August 2006.   Recommendations;  Colonoscopy to be scheduled at [facility]  in near future. She will be given antimicrobial prophylaxis as recommended by her orthopedic surgeon Dr. Fuller Canada. Patient will have CBC repeated at the time of procedure. Patient advised to discontinue iron pills.

## 2012-04-14 ENCOUNTER — Encounter (INDEPENDENT_AMBULATORY_CARE_PROVIDER_SITE_OTHER): Payer: Self-pay | Admitting: *Deleted

## 2012-04-14 MED ORDER — PEG-KCL-NACL-NASULF-NA ASC-C 100 G PO SOLR: 1.0000 | Freq: Once | ORAL | Status: DC

## 2012-04-24 ENCOUNTER — Encounter (HOSPITAL_COMMUNITY): Payer: Self-pay | Admitting: Pharmacy Technician

## 2012-05-04 ENCOUNTER — Encounter: Payer: Self-pay | Admitting: Orthopedic Surgery

## 2012-05-04 ENCOUNTER — Ambulatory Visit (INDEPENDENT_AMBULATORY_CARE_PROVIDER_SITE_OTHER): Payer: Medicare HMO

## 2012-05-04 ENCOUNTER — Ambulatory Visit (INDEPENDENT_AMBULATORY_CARE_PROVIDER_SITE_OTHER): Payer: Medicare HMO | Admitting: Orthopedic Surgery

## 2012-05-04 VITALS — BP 126/86 | Ht 63.0 in | Wt 154.0 lb

## 2012-05-04 DIAGNOSIS — M25569 Pain in unspecified knee: Secondary | ICD-10-CM

## 2012-05-04 DIAGNOSIS — M79606 Pain in leg, unspecified: Secondary | ICD-10-CM

## 2012-05-04 DIAGNOSIS — M79609 Pain in unspecified limb: Secondary | ICD-10-CM

## 2012-05-04 MED ORDER — GABAPENTIN 100 MG PO CAPS: 100.0000 mg | ORAL_CAPSULE | Freq: Three times a day (TID) | ORAL | Status: DC

## 2012-05-04 MED ORDER — HYDROCODONE-ACETAMINOPHEN 5-325 MG PO TABS: 1.0000 | ORAL_TABLET | Freq: Four times a day (QID) | ORAL | Status: DC | PRN

## 2012-05-04 NOTE — Patient Instructions (Addendum)
Call [facility] to arrange an injection at L5-S1  Blood work done at the outpatient lab   Diagnosis: pinched nerve right leg

## 2012-05-04 NOTE — Progress Notes (Signed)
Subjective:     Patient ID: Kelli Stone, female   DOB: [birth year], 78 y.o.   MRN: [id number] Chief Complaint  Patient presents with  . Follow-up    Pain right leg, right  knee replacement October of 2012    HPI Status post right total knee arthroplasty complicated by persistent lateral leg pain numbness tingling and shocklike sensation radiating from the hip down into the foot. Previously treated with Neurontin currently not on that.  She says the knee is making noise. She says it's feeling warm.  Review of Systems No fever, no chills  No red flags of bowel bladder or saddle anesthesia    Objective:   Physical Exam BP 126/86  Ht 5\' 3"  (1.6 m)  Wt 154 lb (69.854 kg)  BMI 27.28 kg/m2 General appearance is normal body habitus is moderate/medium  The knee is warmer than the left knee. There is no joint effusion. The incision is healed without tenderness. The patient reports pain when range of motion of the right knee but no stiffness or rigidity  There is decreased sensation on the lateral border of the leg into the foot with positive straight leg raise on the right  The knee remains stable in extension as well as flexion muscle tone remains normal dorsiflexion plantarflexion of the foot strength normal pulse normal no peripheral edema. Balance normal.  Repeat radiograph shows slight varus of the tibial implant but otherwise normal appearing knee replacement    Assessment:     Differential diagnosis periprosthetic infection  MRI previously showed perfusion of the L4-5 disc with foraminal stenosis of the L5-S1 disc    Plan:     Recommend laboratory studies to delineate possible periprosthetic infection  Repeat epidural injection Start Neurontin continue hydrocodone

## 2012-05-05 ENCOUNTER — Other Ambulatory Visit: Payer: Self-pay | Admitting: Orthopedic Surgery

## 2012-05-05 DIAGNOSIS — M549 Dorsalgia, unspecified: Secondary | ICD-10-CM

## 2012-05-05 LAB — CBC
HCT: 32.2 % — ABNORMAL LOW (ref 36.0–46.0)
Hemoglobin: 11.5 g/dL — ABNORMAL LOW (ref 12.0–15.0)
MCH: 27.8 pg (ref 26.0–34.0)
MCHC: 35.7 g/dL (ref 30.0–36.0)

## 2012-05-06 MED ORDER — SODIUM CHLORIDE 0.45 % IV SOLN
Freq: Once | INTRAVENOUS | Status: AC
  Administered 2012-05-07: 13:00:00 via INTRAVENOUS

## 2012-05-07 ENCOUNTER — Encounter (HOSPITAL_COMMUNITY): Payer: Self-pay | Admitting: *Deleted

## 2012-05-07 ENCOUNTER — Ambulatory Visit (HOSPITAL_COMMUNITY)
Admission: RE | Admit: 2012-05-07 | Discharge: 2012-05-07 | Disposition: A | Discharge status: Home / Self Care | Payer: Medicare HMO | Source: Ambulatory Visit | Attending: Internal Medicine | Admitting: Internal Medicine

## 2012-05-07 ENCOUNTER — Encounter (HOSPITAL_COMMUNITY)
Admission: RE | Disposition: A | Discharge status: Home / Self Care | Payer: Self-pay | Source: Ambulatory Visit | Attending: Internal Medicine

## 2012-05-07 DIAGNOSIS — Z800 Family history of malignant neoplasm of digestive organs: Secondary | ICD-10-CM

## 2012-05-07 DIAGNOSIS — E7800 Pure hypercholesterolemia, unspecified: Secondary | ICD-10-CM | POA: Insufficient documentation

## 2012-05-07 DIAGNOSIS — K5730 Diverticulosis of large intestine without perforation or abscess without bleeding: Secondary | ICD-10-CM

## 2012-05-07 DIAGNOSIS — Z01812 Encounter for preprocedural laboratory examination: Secondary | ICD-10-CM | POA: Insufficient documentation

## 2012-05-07 DIAGNOSIS — D649 Anemia, unspecified: Secondary | ICD-10-CM

## 2012-05-07 DIAGNOSIS — K921 Melena: Secondary | ICD-10-CM | POA: Insufficient documentation

## 2012-05-07 DIAGNOSIS — E119 Type 2 diabetes mellitus without complications: Secondary | ICD-10-CM | POA: Insufficient documentation

## 2012-05-07 DIAGNOSIS — R197 Diarrhea, unspecified: Secondary | ICD-10-CM | POA: Insufficient documentation

## 2012-05-07 DIAGNOSIS — K625 Hemorrhage of anus and rectum: Secondary | ICD-10-CM

## 2012-05-07 DIAGNOSIS — I10 Essential (primary) hypertension: Secondary | ICD-10-CM | POA: Insufficient documentation

## 2012-05-07 HISTORY — PX: COLONOSCOPY: SHX5424

## 2012-05-07 LAB — GLUCOSE, CAPILLARY: Glucose-Capillary: 164 mg/dL — ABNORMAL HIGH (ref 70–99)

## 2012-05-07 SURGERY — COLONOSCOPY
Anesthesia: Moderate Sedation

## 2012-05-07 MED ORDER — MIDAZOLAM HCL 5 MG/5ML IJ SOLN
INTRAMUSCULAR | Status: AC
  Filled 2012-05-07: qty 10

## 2012-05-07 MED ORDER — VANCOMYCIN HCL 1000 MG IV SOLR
1000.0000 mg | Freq: Two times a day (BID) | INTRAVENOUS | Status: DC
  Filled 2012-05-07: qty 1000

## 2012-05-07 MED ORDER — DEXAMETHASONE SODIUM PHOSPHATE 4 MG/ML IJ SOLN
INTRAMUSCULAR | Status: AC
  Filled 2012-05-07: qty 1

## 2012-05-07 MED ORDER — DEXAMETHASONE SODIUM PHOSPHATE 4 MG/ML IJ SOLN
4.0000 mg | Freq: Once | INTRAMUSCULAR | Status: AC
  Administered 2012-05-07: 4 mg via INTRAVENOUS

## 2012-05-07 MED ORDER — CIPROFLOXACIN IN D5W 400 MG/200ML IV SOLN
INTRAVENOUS | Status: AC
  Filled 2012-05-07: qty 200

## 2012-05-07 MED ORDER — STERILE WATER FOR IRRIGATION IR SOLN
Status: DC | PRN
  Administered 2012-05-07: 16:00:00

## 2012-05-07 MED ORDER — MIDAZOLAM HCL 5 MG/5ML IJ SOLN
INTRAMUSCULAR | Status: AC
  Filled 2012-05-07: qty 5

## 2012-05-07 MED ORDER — VANCOMYCIN HCL IN DEXTROSE 1-5 GM/200ML-% IV SOLN
1000.0000 mg | Freq: Once | INTRAVENOUS | Status: AC
  Administered 2012-05-07: 1000 mg via INTRAVENOUS

## 2012-05-07 MED ORDER — VANCOMYCIN HCL IN DEXTROSE 1-5 GM/200ML-% IV SOLN
INTRAVENOUS | Status: AC
  Filled 2012-05-07: qty 200

## 2012-05-07 MED ORDER — MEPERIDINE HCL 50 MG/ML IJ SOLN
INTRAMUSCULAR | Status: AC
  Filled 2012-05-07: qty 1

## 2012-05-07 MED ORDER — CIPROFLOXACIN 400 MG/40ML IV SOLN
400.0000 mg | Freq: Once | INTRAVENOUS | Status: DC
  Filled 2012-05-07: qty 40

## 2012-05-07 MED ORDER — MIDAZOLAM HCL 2 MG/2ML IJ SOLN
1.0000 mg | Freq: Once | INTRAMUSCULAR | Status: AC
  Administered 2012-05-07: 1 mg via INTRAVENOUS

## 2012-05-07 MED ORDER — MIDAZOLAM HCL 2 MG/2ML IJ SOLN
INTRAMUSCULAR | Status: AC
  Filled 2012-05-07: qty 2

## 2012-05-07 MED ORDER — MIDAZOLAM HCL 5 MG/5ML IJ SOLN
INTRAMUSCULAR | Status: DC | PRN
  Administered 2012-05-07: 2 mg via INTRAVENOUS
  Administered 2012-05-07 (×2): 1 mg via INTRAVENOUS

## 2012-05-07 MED ORDER — CIPROFLOXACIN IN D5W 400 MG/200ML IV SOLN
400.0000 mg | Freq: Once | INTRAVENOUS | Status: AC
  Administered 2012-05-07: 400 mg via INTRAVENOUS

## 2012-05-07 MED ORDER — MEPERIDINE HCL 50 MG/ML IJ SOLN
INTRAMUSCULAR | Status: DC | PRN
  Administered 2012-05-07 (×2): 25 mg via INTRAVENOUS

## 2012-05-07 NOTE — Op Note (Signed)
COLONOSCOPY PROCEDURE REPORT  PATIENT:  Kelli Stone  MR#:  [id number] Birthdate:  Aug 08, 1931, 78 y.o., female Endoscopist:  Dr. Malissa Hippo, MD Referred By:  Dr. Ignatius Specking, MD Procedure Date: 05/07/2012  Procedure:   Colonoscopy  Indications: Patient is 78 year old African female who is recently hospitalized at [facility] for lower GI bleed and diarrhea. She is undergoing diagnostic colonoscopy. Family history is significant for colon carcinoma sister who died of CRC at age 81.  Informed Consent:  The procedure and risks were reviewed with the patient and informed consent was obtained.  Medications:  Demerol 50 mg IV Versed 4  mg IV  Description of procedure:  After a digital rectal exam was performed, that colonoscope was advanced from the anus through the rectum and colon to the area of distal transverse colon. Scope could not be advanced any further. As the scope was withdrawn mucosa of descending sigmoid colon and rectum was carefully examined.  Findings:   Prep excellent. Tortuous noncompliant sigmoid colon resulting in incomplete examination. Moderate number of diverticula at sigmoid colon. Normal rectal mucosa and anal rectal junction.  Therapeutic/Diagnostic Maneuvers Performed:  None  Complications:  None  Cecal Withdrawal Time:  NA minutes  Impression:  Incomplete exam splenic flexure secondary to or she is noncompliant sigmoid colon. Moderate number of diverticula at sigmoid colon.  Recommendations:  Standard instructions given. Office visit in 8 weeks and will determine whether she should have further evaluation.  Maisee Vollman U  05/07/2012 4:19 PM  CC: Dr. Ignatius Specking., MD & Dr. Bonnetta Barry ref. provider found

## 2012-05-07 NOTE — Progress Notes (Signed)
Pt received Vancomycin 1gm over 60 minutes pre-op for colonoscopy. At the very end of the infusion of Vancomycin, pt started reporting she felt "hot" and "just didn't feel right". Pt's face across her cheeks and nose were red. Pt is diabetic, so CBG wast taken and showed to be 164. Pt's BP was 188/88, HR 79, O2 saturation 100%. Pt denied SOB, dyspnea, and itching. Dr. Karilyn Cota immediately notified, he felt it may be Red Man's Syndrome from the Vancomycin. Order given to administer Decadron 4mg  IV. Pt is allergic to Depo-Medrol and Dr. Karilyn Cota was notified, he said it should be okay. I checked with pharmacist to verify that Decadron was safe to give giving pt's allergy to Depo-Medrol. Pharmacist said pt could possibly have the same reaction to Decadron that she has to Depo-Medrol. Pt's reaction to Depo-Medrol is itching, no anaphylaxis. Pt confirmed this during pre-op review of information. Decadron 4mg  IV given, will continue to monitor pt.

## 2012-05-07 NOTE — H&P (Signed)
Kelli Stone is an 78 y.o. female.   Chief Complaint: Patient is for colonoscopy. HPI: Patient is 78 year old African American female who was hospitalized [facility] in [facility] with GI bleed and diarrhea and received 2 units of PRBCs. He was empirically treated with antibiotics and has improved. She is undergoing diagnostic colonoscopy. She denies abdominal pain anorexia weight loss. Her hemoglobin 2 days ago was 11.5 g. Patient was given IV Cipro and vancomycin for antimicrobial prophylaxis as recommended by orthopedic surgeon Dr. Romeo Apple. She developed Redman syndrome to its end of vancomycin infusion is given 8 mg of Decadron IV and 1 mg of Versed and symptoms have resolved. Family history significant for colon carcinoma in a sister who died at age 52.  Past Medical History  Diagnosis Date  . Glaucoma   . GERD (gastroesophageal reflux disease)   . Personal history of tobacco use, presenting hazards to health   . Personal history of allergy to penicillin   . Personal history of allergy to sulfonamides   . Vertigo     HISTORY OF  . Diabetes mellitus     diet controlled  . HTN (hypertension)   . Pure hypercholesterolemia   . Heart murmur     Past Surgical History  Procedure Date  . Appendectomy   . Right shoulder   . Shoulder surgery   . Breast surgery bilateral  . Vaginal hysterectomy   . Total knee arthroplasty 07/01/2011    Procedure: TOTAL KNEE ARTHROPLASTY;  Surgeon: Fuller Canada, MD;  Location: [facility];  Service: Orthopedics;  Laterality: Right;  Depuy    Family History  Problem Relation Age of Onset  . Cancer    . Diabetes    . Diabetes Mother   . Bone cancer Father   . Diabetes Sister   . Kidney disease Sister   . Stomach cancer Sister   . Stroke Brother   . Bone cancer Brother   . Healthy Son    Social History:  reports that she quit smoking about 28 years ago. Her smoking use included Cigarettes. She has a 3 pack-year smoking  history. She has never used smokeless tobacco. She reports that she does not drink alcohol or use illicit drugs.  Allergies:  Allergies  Allergen Reactions  . Shellfish Allergy Nausea Only and Other (See Comments)    Vomiting & diarrhea  . Bonine (Meclizine Hcl)     Nervous   . Lotrel (Amlodipine Besy-Benazepril Hcl)     Made legs  Ache   . Benadryl (Diphenhydramine Hcl) Other (See Comments)    nervous  . Cephalexin     Pt can't remember   . Other Other (See Comments)    GUIATUSSIN. Unknown reaction  . Sulfa Antibiotics Other (See Comments)    unknown  . Zocor (Simvastatin - High Dose)     Aching   . Celebrex (Celecoxib) Other (See Comments)    Chest discomfort  . Depo-Medrol (Pf) Itching and Other (See Comments)  . Dilacor Xr (Diltiazem Hcl) Other (See Comments)    Didn't dissolve  . Erythromycin Nausea Only  . Lescol Other (See Comments)    aching  . Metformin And Related Diarrhea  . Naprosyn (Naproxen) Nausea Only  . Penicillins Other (See Comments)    sleepy  . Stadol (Butorphanol Tartrate) Other (See Comments)    numb  . Trinalin (Azatadine-Pseudoephedrine) Other (See Comments)    nervous    Medications Prior to Admission  Medication Sig Dispense Refill  .  amLODipine (NORVASC) 5 MG tablet Take 5 mg by mouth daily.        Marland Kitchen gabapentin (NEURONTIN) 100 MG capsule Take 1 capsule (100 mg total) by mouth 3 (three) times daily.  90 capsule  2  . HYDROcodone-acetaminophen (NORCO/VICODIN) 5-325 MG per tablet Take 1 tablet by mouth every 6 (six) hours as needed. For pain  60 tablet  2  . latanoprost (XALATAN) 0.005 % ophthalmic solution Place 1 drop into both eyes at bedtime.        Marland Kitchen lisinopril (PRINIVIL,ZESTRIL) 40 MG tablet Take 40 mg by mouth daily.        . timolol (BETIMOL) 0.25 % ophthalmic solution Place 1 drop into both eyes every morning.       Marland Kitchen omeprazole (PRILOSEC) 20 MG capsule Take 20 mg by mouth daily.      . peg 3350 powder (MOVIPREP) 100 G SOLR Take 1  kit (100 g total) by mouth once.  1 kit  0    Results for orders placed during the hospital encounter of 05/07/12 (from the past 48 hour(s))  GLUCOSE, CAPILLARY     Status: Abnormal   Collection Time   05/07/12  3:06 PM      Component Value Range Comment   Glucose-Capillary 164 (*) 70 - 99 mg/dL    No results found.  ROS  Blood pressure 153/75, temperature 98 F (36.7 C), temperature source Oral, resp. rate 15, height 5\' 3"  (1.6 m), weight 151 lb (68.493 kg), SpO2 98.00%. Physical Exam  Constitutional: She appears well-developed and well-nourished.  HENT:  Mouth/Throat: Oropharynx is clear and moist.  Eyes: Conjunctivae are normal. No scleral icterus.  Neck: No thyromegaly present.  Cardiovascular: Normal rate, regular rhythm and normal heart sounds.   Murmur: Grade3/6 systolic ejection murmur best heard at aortic area. Respiratory: Effort normal and breath sounds normal.  GI: Soft. She exhibits no distension and no mass. There is no tenderness.  Musculoskeletal: She exhibits no edema.  Lymphadenopathy:    She has no cervical adenopathy.  Neurological: She is alert.  Skin: Skin is warm and dry.     Assessment/Plan Rectal bleeding. Family history of colon carcinoma. Diagnostic colonoscopy.  REHMAN,NAJEEB U 05/07/2012, 3:39 PM

## 2012-05-12 ENCOUNTER — Encounter (HOSPITAL_COMMUNITY): Payer: Self-pay | Admitting: Internal Medicine

## 2012-05-12 ENCOUNTER — Encounter (INDEPENDENT_AMBULATORY_CARE_PROVIDER_SITE_OTHER): Payer: Self-pay

## 2012-05-13 ENCOUNTER — Ambulatory Visit
Admission: RE | Admit: 2012-05-13 | Discharge: 2012-05-13 | Disposition: A | Discharge status: Home / Self Care | Payer: Medicare HMO | Source: Ambulatory Visit | Attending: Orthopedic Surgery | Admitting: Orthopedic Surgery

## 2012-05-13 ENCOUNTER — Other Ambulatory Visit: Payer: Self-pay | Admitting: Orthopedic Surgery

## 2012-05-13 VITALS — BP 165/63 | HR 56

## 2012-05-13 DIAGNOSIS — M549 Dorsalgia, unspecified: Secondary | ICD-10-CM

## 2012-05-13 MED ORDER — DIAZEPAM 5 MG PO TABS
5.0000 mg | ORAL_TABLET | Freq: Once | ORAL | Status: AC
  Administered 2012-05-13: 5 mg via ORAL

## 2012-05-13 MED ORDER — METHYLPREDNISOLONE ACETATE 40 MG/ML INJ SUSP (RADIOLOG
120.0000 mg | Freq: Once | INTRAMUSCULAR | Status: AC
  Administered 2012-05-13: 120 mg via EPIDURAL

## 2012-05-13 MED ORDER — IOHEXOL 180 MG/ML  SOLN
1.0000 mL | Freq: Once | INTRAMUSCULAR | Status: AC | PRN
  Administered 2012-05-13: 1 mL via EPIDURAL

## 2012-05-13 NOTE — Progress Notes (Signed)
Allergies reviewed, no allergy to contrast. Several things the pt calls allergies are side effects to the medication, such as, bendadryl makes her nervous.

## 2012-05-27 ENCOUNTER — Ambulatory Visit (INDEPENDENT_AMBULATORY_CARE_PROVIDER_SITE_OTHER): Payer: Medicare HMO | Admitting: Orthopedic Surgery

## 2012-05-27 ENCOUNTER — Encounter: Payer: Self-pay | Admitting: Orthopedic Surgery

## 2012-05-27 VITALS — BP 160/90 | Ht 63.0 in | Wt 153.0 lb

## 2012-05-27 DIAGNOSIS — M25569 Pain in unspecified knee: Secondary | ICD-10-CM

## 2012-05-27 NOTE — Patient Instructions (Signed)
CALL HOSPITAL TO SCHEDULE PHYSICAL THERAPY

## 2012-05-27 NOTE — Progress Notes (Signed)
Patient ID: Kelli Stone, female   DOB: 18-Apr-1931, 78 y.o.   MRN: [id number] Chief Complaint  Patient presents with  . Follow-up    recheck on leg pain after ESI injection.    Status post 2 epidural series injections which helped significantly with her lateral leg pain and the radicular symptoms. She still has some anterior knee pain and we will send her for physical therapy for that  Recommend continue Neurontin start physical therapy and return in one month to check on the anterior knee pain status post right total knee

## 2012-06-03 ENCOUNTER — Ambulatory Visit (HOSPITAL_COMMUNITY)
Admission: RE | Admit: 2012-06-03 | Discharge: 2012-06-03 | Disposition: A | Discharge status: Home / Self Care | Payer: Medicare HMO | Source: Ambulatory Visit | Attending: Orthopedic Surgery | Admitting: Orthopedic Surgery

## 2012-06-03 DIAGNOSIS — M6281 Muscle weakness (generalized): Secondary | ICD-10-CM | POA: Insufficient documentation

## 2012-06-03 DIAGNOSIS — I10 Essential (primary) hypertension: Secondary | ICD-10-CM | POA: Insufficient documentation

## 2012-06-03 DIAGNOSIS — E119 Type 2 diabetes mellitus without complications: Secondary | ICD-10-CM | POA: Insufficient documentation

## 2012-06-03 DIAGNOSIS — M25579 Pain in unspecified ankle and joints of unspecified foot: Secondary | ICD-10-CM | POA: Insufficient documentation

## 2012-06-03 DIAGNOSIS — M25669 Stiffness of unspecified knee, not elsewhere classified: Secondary | ICD-10-CM | POA: Insufficient documentation

## 2012-06-03 DIAGNOSIS — M25569 Pain in unspecified knee: Secondary | ICD-10-CM | POA: Insufficient documentation

## 2012-06-03 DIAGNOSIS — IMO0001 Reserved for inherently not codable concepts without codable children: Secondary | ICD-10-CM | POA: Insufficient documentation

## 2012-06-03 DIAGNOSIS — E7800 Pure hypercholesterolemia, unspecified: Secondary | ICD-10-CM | POA: Insufficient documentation

## 2012-06-03 NOTE — Evaluation (Signed)
Physical Therapy Evaluation  Patient Details  Name: VANILLA HEATHERINGTON MRN: [id number] Date of Birth: 1931/07/17  Today's Date: 06/03/2012 Time: 1100-1144 PT Time Calculation (min): 44 min  Visit#: 1  of 12   Re-eval: 07/03/12 Assessment Diagnosis: anterior knee pain Surgical Date: 07/01/11 Next MD Visit: 10/12 Prior Therapy: 2012  Authorization: [employer]- not yet authorized  Past Medical History:  Past Medical History  Diagnosis Date  . Glaucoma   . GERD (gastroesophageal reflux disease)   . Personal history of tobacco use, presenting hazards to health   . Personal history of allergy to penicillin   . Personal history of allergy to sulfonamides   . Vertigo     HISTORY OF  . Diabetes mellitus     diet controlled  . HTN (hypertension)   . Pure hypercholesterolemia   . Heart murmur    Past Surgical History:  Past Surgical History  Procedure Date  . Appendectomy   . Right shoulder   . Shoulder surgery   . Breast surgery bilateral  . Vaginal hysterectomy   . Total knee arthroplasty 07/01/2011    Procedure: TOTAL KNEE ARTHROPLASTY;  Surgeon: Fuller Canada, MD;  Location: [facility];  Service: Orthopedics;  Laterality: Right;  Depuy  . Colonoscopy 05/07/2012    Procedure: COLONOSCOPY;  Surgeon: Malissa Hippo, MD;  Location: [facility];  Service: Endoscopy;  Laterality: N/A;  200    Subjective Symptoms/Limitations Symptoms: Ms. Knobloch states that her pain has been going down from her knee to her ankle for several months.  She states that whenever she goes to move her leg she experiences the pain.  She has been referred to Pt to resolve her pain and improve her functional status. Pertinent History: chronic LBP for years which is increaseing. How long can you sit comfortably?: Pt is limited in sitting by her back How long can you stand comfortably?: Able to stand for 20 minutes. How long can you walk comfortably?: able to walk for 10 minutes. Patient Stated Goals: less  pain Pain Assessment Currently in Pain?: Yes Pain Score:   7 Pain Location: Knee Pain Orientation: Right Pain Type: Chronic pain Pain Radiating Towards: ankle Pain Onset: Today Pain Relieving Factors: lying down Effect of Pain on Daily Activities: increases   Sensation/Coordination/Flexibility/Functional Tests Functional Tests Functional Tests: LEFS 27/64 taking out running and hopping  Assessment RLE AROM (degrees) Right Knee Extension: 2  Right Knee Flexion: 120  RLE Strength Right Hip Flexion: 3-/5 Right Hip Extension: 3-/5 Right Hip ABduction: 3/5 Right Hip ADduction: 3-/5 Right Knee Flexion: 3+/5 Right Knee Extension: 3+/5 Right Ankle Dorsiflexion: 3+/5  Exercise/Treatments Stretches Active Hamstring Stretch: 3 reps;20 seconds Knee: Self-Stretch to increase Flexion: Limitations Knee: Self-Stretch Limitations: K-C x 3 Supine Quad Sets: 10 reps Terminal Knee Extension: 10 reps Bridges: 10 reps Other Supine Knee Exercises: bent knee raise x 10  Physical Therapy Assessment and Plan PT Assessment and Plan Clinical Impression Statement: Pt with complaint of radicular pain from knee to ankle.  Pt has had past TKR but also has hx of chronic LBP.  Pt demonstrates decreased strength of R LE and decreased activity level.  PT will benefit from skilled PT to improve gt, activity level and decrease pain to improve her quality of life. Pt will benefit from skilled therapeutic intervention in order to improve on the following deficits: Difficulty walking;Abnormal gait;Decreased activity tolerance;Pain Rehab Potential: Good PT Frequency: Min 3X/week PT Duration: 4 weeks PT Treatment/Interventions: Gait training;Therapeutic activities;Therapeutic exercise;Manual techniques;Modalities  PT Plan: Pt to begin heel raise, minisquats, heel squeeze, ham curl with 3# wt next treatment;  Begin floating SLR; PRone hip ext  and  SL AB 3rd treatment.      Goals Home Exercise Program Pt will  Perform Home Exercise Program: Independently PT Short Term Goals Time to Complete Short Term Goals: 2 weeks PT Short Term Goal 1: Pt able to stand for 30 minutes PT Long Term Goals Time to Complete Long Term Goals: 4 weeks PT Long Term Goal 1: I in advance HEP PT Long Term Goal 2: Pain to be no greater than a 2 Long Term Goal 3: Able to walk for 45 min Long Term Goal 4: Able to take steps reciprocally PT Long Term Goal 5: normalized gt  Problem List Patient Active Problem List  Diagnosis  . HTN (hypertension)  . Pure hypercholesterolemia  . Aortic stenosis  . Dyspnea  . OA (osteoarthritis) of knee  . Arthritis of knee, right  . Constipated  . S/P total knee replacement  . Difficulty in walking  . Stiffness of joint, not elsewhere classified, lower leg  . Pain in joint, lower leg  . Bursitis of shoulder, right  . Gastrocnemius strain  . Infection  . DVT (deep venous thrombosis)  . Neuropathy of leg  . Spinal stenosis  . Anemia    PT - End of Session Activity Tolerance: Patient tolerated treatment well General Behavior During Session: [facility] for tasks performed Cognition: [facility] for tasks performed PT Plan of Care PT Home Exercise Plan: given Consulted and Agree with Plan of Care: Patient  GP Functional Assessment Tool Used: LEFS Functional Limitation: Mobility: Walking and moving around Mobility: Walking and Moving Around Current Status (Z6109): At least 40 percent but less than 60 percent impaired, limited or restricted Mobility: Walking and Moving Around Goal Status [phone]): At least 1 percent but less than 20 percent impaired, limited or restricted  Ashwin Tibbs,CINDY 06/03/2012, 12:01 PM  Physician Documentation Your signature is required to indicate approval of the treatment plan as stated above.  Please sign and either send electronically or make a copy of this report for your files and return this physician signed original.   Please mark one 1.__approve of plan  2.  ___approve of plan with the following conditions.   ______________________________                                                          _____________________ Physician Signature                                                                                                             Date

## 2012-06-05 ENCOUNTER — Encounter (HOSPITAL_COMMUNITY): Payer: Self-pay | Admitting: Pharmacy Technician

## 2012-06-05 ENCOUNTER — Ambulatory Visit (HOSPITAL_COMMUNITY): Payer: Medicare HMO | Admitting: Physical Therapy

## 2012-06-10 ENCOUNTER — Ambulatory Visit (HOSPITAL_COMMUNITY): Payer: Medicare HMO | Admitting: Occupational Therapy

## 2012-06-10 ENCOUNTER — Ambulatory Visit (HOSPITAL_COMMUNITY): Payer: Medicare HMO | Admitting: *Deleted

## 2012-06-15 ENCOUNTER — Ambulatory Visit (HOSPITAL_COMMUNITY)
Admission: RE | Admit: 2012-06-15 | Discharge: 2012-06-15 | Disposition: A | Discharge status: Home / Self Care | Payer: Medicare HMO | Source: Ambulatory Visit | Attending: Ophthalmology | Admitting: Ophthalmology

## 2012-06-15 ENCOUNTER — Encounter (HOSPITAL_COMMUNITY)
Admission: RE | Disposition: A | Discharge status: Home / Self Care | Payer: Self-pay | Source: Ambulatory Visit | Attending: Ophthalmology

## 2012-06-15 ENCOUNTER — Encounter (HOSPITAL_COMMUNITY): Payer: Self-pay | Admitting: *Deleted

## 2012-06-15 DIAGNOSIS — H4089 Other specified glaucoma: Secondary | ICD-10-CM | POA: Insufficient documentation

## 2012-06-15 SURGERY — SLT LASER APPLICATION
Anesthesia: LOCAL | Laterality: Right

## 2012-06-15 MED ORDER — TETRACAINE HCL 0.5 % OP SOLN
1.0000 [drp] | Freq: Once | OPHTHALMIC | Status: AC
  Administered 2012-06-15: 1 [drp] via OPHTHALMIC

## 2012-06-15 MED ORDER — PILOCARPINE HCL 1 % OP SOLN
2.0000 [drp] | Freq: Once | OPHTHALMIC | Status: AC
  Administered 2012-06-15: 1 [drp] via OPHTHALMIC

## 2012-06-15 MED ORDER — APRACLONIDINE HCL 1 % OP SOLN
1.0000 [drp] | OPHTHALMIC | Status: AC
  Administered 2012-06-15: 2 [drp] via OPHTHALMIC
  Administered 2012-06-15 (×2): 1 [drp] via OPHTHALMIC

## 2012-06-15 MED ORDER — TETRACAINE HCL 0.5 % OP SOLN
OPHTHALMIC | Status: AC
  Filled 2012-06-15: qty 2

## 2012-06-15 MED ORDER — PILOCARPINE HCL 1 % OP SOLN
OPHTHALMIC | Status: AC
  Filled 2012-06-15: qty 15

## 2012-06-15 MED ORDER — APRACLONIDINE HCL 1 % OP SOLN
OPHTHALMIC | Status: AC
  Filled 2012-06-15: qty 0.1

## 2012-06-15 NOTE — H&P (Signed)
I have reviewed the pre printed H&P, the patient was re-examined, and I have identified no significant interval changes in the patient's medical condition.  There is no change in the plan of care since the history and physical of record. 

## 2012-06-15 NOTE — Brief Op Note (Signed)
Kelli Stone 06/15/2012  [name], MD  Yag Laser Self Test Completedyes. Procedure: SLT, right eye.  Eye Protection Worn by Staff yes. Laser In Use Sign on Door yes.  Laser: Nd:YAG Spot Size: Fixed Burst Mode: III Power Setting: 0.9 mJ/burst Position treated: 360 o'clock position Number of shots: 170 Total energy delivered: 152 mJ  Patency of the peripheral iridotomy was confirmed visually.  The patient tolerated the procedure without difficulty. No complications were encountered.  Tenometer reading immediately after procedure: 18 mmHg.  The patient was discharged home with the instructions to continue all her current glaucoma medications, if any.   Patient instructed to go to office for intraocular pressure check per discharge instructions..  Patient verbalizes understanding of discharge instructions yes.   Notes:gonioscopy OD:  open grade 4/4, little pigment, no synechiae or recessions

## 2012-06-17 ENCOUNTER — Ambulatory Visit (HOSPITAL_COMMUNITY): Payer: Medicare HMO | Admitting: *Deleted

## 2012-07-01 ENCOUNTER — Ambulatory Visit: Payer: Medicare HMO | Admitting: Orthopedic Surgery

## 2012-07-08 ENCOUNTER — Encounter: Payer: Self-pay | Admitting: Orthopedic Surgery

## 2012-07-08 ENCOUNTER — Ambulatory Visit (INDEPENDENT_AMBULATORY_CARE_PROVIDER_SITE_OTHER): Payer: Medicare HMO | Admitting: Orthopedic Surgery

## 2012-07-08 VITALS — BP 124/70 | Ht 63.0 in | Wt 156.0 lb

## 2012-07-08 DIAGNOSIS — M7650 Patellar tendinitis, unspecified knee: Secondary | ICD-10-CM | POA: Insufficient documentation

## 2012-07-08 NOTE — Progress Notes (Signed)
Patient ID: CATHALINA BARCIA, female   DOB: [birth year], 78 y.o.   MRN: [id number] Chief Complaint  Patient presents with  . Follow-up    right knee pain    Continued here right knee pain  Patient informs me that she's having difficulty with her eyes she's had one surgery will have another on the left eye  She is also having increased pain along the anterolateral leg consistent with L5 root impingement. She stopped her Neurontin secondary to medication that was taken for her I am is contraindicated for Neurontin she can start the Neurontin again on Saturday  Right knee shows a well-healed anterior incision she has good strength and no pain when she extends her knee her knee is stable she is tenderness to palpation in the patellar tendon quadriceps tendon normal knee stable. Good distal pulse.  Impression patellar tendinitis anterior knee pain after knee replacement  Workup for infection was negative  Recommend patellar tendon injection   she has flexion of 120 she has full extension  After obtaining consent, injection of  Depo-Medrol and lidocaine 1% was given by Fuller Canada.   Use ethyl chloride for anesthesia and we used alcohol for the prep

## 2012-07-08 NOTE — Patient Instructions (Signed)
You have received a steroid shot. 15% of patients experience increased pain at the injection site with in the next 24 hours. This is best treated with ice and tylenol extra strength 2 tabs every 8 hours. If you are still having pain please call the office.    

## 2012-07-22 ENCOUNTER — Encounter (INDEPENDENT_AMBULATORY_CARE_PROVIDER_SITE_OTHER): Payer: Self-pay | Admitting: *Deleted

## 2012-08-04 ENCOUNTER — Ambulatory Visit (INDEPENDENT_AMBULATORY_CARE_PROVIDER_SITE_OTHER): Payer: Medicare HMO | Admitting: Internal Medicine

## 2012-08-10 ENCOUNTER — Encounter (INDEPENDENT_AMBULATORY_CARE_PROVIDER_SITE_OTHER): Payer: Self-pay | Admitting: Internal Medicine

## 2012-08-10 ENCOUNTER — Ambulatory Visit (INDEPENDENT_AMBULATORY_CARE_PROVIDER_SITE_OTHER): Payer: Medicare HMO | Admitting: Internal Medicine

## 2012-08-10 VITALS — BP 150/80 | HR 82 | Temp 98.4°F | Resp 18 | Ht 62.0 in | Wt 154.4 lb

## 2012-08-10 DIAGNOSIS — K5732 Diverticulitis of large intestine without perforation or abscess without bleeding: Secondary | ICD-10-CM | POA: Insufficient documentation

## 2012-08-10 DIAGNOSIS — Z8719 Personal history of other diseases of the digestive system: Secondary | ICD-10-CM

## 2012-08-10 MED ORDER — METRONIDAZOLE 500 MG PO TABS: 500.0000 mg | ORAL_TABLET | Freq: Two times a day (BID) | ORAL | Status: DC

## 2012-08-10 MED ORDER — CIPROFLOXACIN HCL 500 MG PO TABS: 500.0000 mg | ORAL_TABLET | Freq: Two times a day (BID) | ORAL | Status: DC

## 2012-08-10 NOTE — Progress Notes (Signed)
Presenting complaint;  Left-sided abdominal pain; recent rectal bleeding and melena.  Subjective:  Patient is 78 year old African female was therefore scheduled visit accompanied by her husband. She was hospitalized at [facility] in [place] in July 2013 for left-sided abdominal pain and rectal bleeding. Abdominopelvic CT suggested left-sided colitis. She was treated with Cipro and improved. She underwent colonoscopy by me on 05/07/2012 revealing moderate number of diverticula at sigmoid colon. I was not able to pass the scope beyond distal transverse colon. Mucosa of splenic flexure, descending and sigmoid colon was normal. Her H&H on 05/04/2012 was 11.5 and 32.2. She was doing well until 08/05/2012 when she developed left lower quadrant abdominal pain associated with diaphoresis he is and vomiting as well as diarrhea. On 08/06/2012 she was still having LLQ abdominal pain and noted bright red blood per rectum and subsequently had to tarry stool the she was treated and discharged. She has continued to experience left-sided abdominal pain. She was seen by Dr. Marilynn Latino a day after her visit in emergency room her stool was noted to be guaiac negative. She denies dysuria or hematuria. She does not take NSAIDs. There is no history of peptic ulcer disease. She has not been on her iron pills for one month. She has good appetite.  Current Medications: Current Outpatient Prescriptions  Medication Sig Dispense Refill  . amLODipine (NORVASC) 5 MG tablet Take 5 mg by mouth daily.        Marland Kitchen HYDROcodone-acetaminophen (NORCO/VICODIN) 5-325 MG per tablet Take 1 tablet by mouth every 6 (six) hours as needed. For pain  60 tablet  2  . latanoprost (XALATAN) 0.005 % ophthalmic solution Place 1 drop into both eyes at bedtime.        Marland Kitchen lisinopril (PRINIVIL,ZESTRIL) 40 MG tablet Take 40 mg by mouth daily.        Marland Kitchen omeprazole (PRILOSEC) 20 MG capsule Take 20 mg by mouth daily as needed.       . timolol (BETIMOL) 0.25 % ophthalmic solution  Place 1 drop into both eyes every morning.       . ferrous sulfate 325 (65 FE) MG EC tablet Take 325 mg by mouth daily with breakfast.      . gabapentin (NEURONTIN) 100 MG capsule Take 1 capsule (100 mg total) by mouth 3 (three) times daily.  90 capsule  2     Objective: Blood pressure 150/80, pulse 82, temperature 98.4 F (36.9 C), temperature source Oral, resp. rate 18, height 5\' 2"  (1.575 m), weight 154 lb 6.4 oz (70.035 kg). Patient is alert and in no acute distress the Conjunctiva is pink. Sclera is nonicteric Oropharyngeal mucosa is normal. No neck masses or thyromegaly noted. Cardiac exam with regular rhythm normal S1 and S2 with loud systolic ejection murmur best heard at aortic area.  Lungs are clear to auscultation. Abdomen is full. Bowel sounds are normal. Palpation reveals soft abdomen with mild to moderate tenderness at LLQ. No organomegaly or masses. Rectal examination reveals dark brown stool in the vault which is guaiac negative. No LE edema or clubbing noted.  Labs/studies Results: Lab data from 08/06/2012. WBC 9.6, H&H 11.3 and 34.7, platelet count 294K  Assessment:  #1. Left lower quadrant abdominal pain. She is quite tender. She had multiple diverticula on recent incomplete colonoscopy. She gives history of passing both bright red blood per rectum as well as tarry stool. Stool is guaiac-negative today. Her H&H is not much different from about 3 months ago. I suspect her abdominal pain is  secondary to diverticulitis. Recent bleed may be from right-sided diverticula she does not have any symptoms pertaining to upper GI tract. Proximal half of her colon was not examined at the time of recent colonoscopy. Will plan barium enema once acute symptoms have resolved.   Plan:  Cipro 500 mg by mouth twice a day for 10 days. Metronidazole 500 mg by mouth twice a day for 10 days. Hemoccult card given to the patient. She would bring [place] stool smear if her stools turn black  again. Patient will call us with progress report when she finishes antibiotic therapy. Will plan BE after the holidays. Next office visit in 3 months.

## 2012-08-10 NOTE — Patient Instructions (Signed)
Call office with progress report when you finish antibiotics. Hemoccult if  stools turn black again.

## 2012-08-11 ENCOUNTER — Telehealth (INDEPENDENT_AMBULATORY_CARE_PROVIDER_SITE_OTHER): Payer: Self-pay | Admitting: *Deleted

## 2012-08-11 NOTE — Telephone Encounter (Signed)
Patient was seen 11/25, states Rehman gave her new medicine and she has question about when she needs to take it, please call @ [phone]

## 2012-08-11 NOTE — Telephone Encounter (Signed)
Patient was called and advised per office note 08/10/12. She is to take the Cipro 500 mg 1 by mouth twice a day for 10 days , and the Flagyl 500 mg take 1 by mouth twice a day for 10 days. She verbalized understanding back to me that she understood.

## 2012-08-19 ENCOUNTER — Encounter: Payer: Self-pay | Admitting: Orthopedic Surgery

## 2012-08-19 ENCOUNTER — Ambulatory Visit (INDEPENDENT_AMBULATORY_CARE_PROVIDER_SITE_OTHER): Payer: Medicare HMO | Admitting: Orthopedic Surgery

## 2012-08-19 VITALS — Ht 63.0 in | Wt 156.0 lb

## 2012-08-19 DIAGNOSIS — M79606 Pain in leg, unspecified: Secondary | ICD-10-CM

## 2012-08-19 DIAGNOSIS — M4800 Spinal stenosis, site unspecified: Secondary | ICD-10-CM

## 2012-08-19 DIAGNOSIS — G5790 Unspecified mononeuropathy of unspecified lower limb: Secondary | ICD-10-CM

## 2012-08-19 DIAGNOSIS — M79609 Pain in unspecified limb: Secondary | ICD-10-CM

## 2012-08-19 DIAGNOSIS — Z96659 Presence of unspecified artificial knee joint: Secondary | ICD-10-CM

## 2012-08-19 DIAGNOSIS — M7650 Patellar tendinitis, unspecified knee: Secondary | ICD-10-CM

## 2012-08-19 MED ORDER — GABAPENTIN 100 MG PO CAPS: 100.0000 mg | ORAL_CAPSULE | Freq: Three times a day (TID) | ORAL | Status: DC

## 2012-08-19 NOTE — Patient Instructions (Signed)
Resume gabapentin/neurontin

## 2012-08-19 NOTE — Progress Notes (Signed)
Patient ID: Kelli Stone, female   DOB: 14-Jun-1931, 78 y.o.   MRN: [id number] Chief Complaint  Patient presents with  . Follow-up    6 week recheck on right knee.   1. Patellar tendonitis   2. S/P total knee replacement   3. Spinal stenosis   4. Neuropathy of leg    She is status post injection for patellar tendinitis, followup reference patellar tendinitis  The patient reports that the injection helped her patella tendinitis, but she now has radicular leg symptoms, which I believe are related to her starting Neurontin for other reasons. She had a bout of diverticulosis and then stopped. The medication, but she has recovered from that and then we can start her treatments. Again.  Review of systems increased radicular leg pain.  She still has some tenderness in the patellar tendon, but it is mild. No swelling no warmth or erythema.  She has tenderness down the leg and pain down the leg, primarily in the lateral compartment of the lower leg, and she says it feels weird and uncomfortable to deep type discomfort.  Distal pulses are intact. Knee flexion is normal. Knee stability normal.  Muscle tone normal.  Impression  1. Patellar tendonitis    2. S/P total knee replacement    3. Spinal stenosis    4. Neuropathy of leg    5. Leg pain  gabapentin (NEURONTIN) 100 MG capsule    Plan resume Neurontin.  Follow-up 1 month

## 2012-09-01 ENCOUNTER — Encounter (INDEPENDENT_AMBULATORY_CARE_PROVIDER_SITE_OTHER): Payer: Self-pay

## 2012-09-23 ENCOUNTER — Ambulatory Visit (INDEPENDENT_AMBULATORY_CARE_PROVIDER_SITE_OTHER): Payer: Medicare Other | Admitting: Orthopedic Surgery

## 2012-09-23 ENCOUNTER — Encounter: Payer: Self-pay | Admitting: Orthopedic Surgery

## 2012-09-23 VITALS — BP 142/80 | Ht 63.0 in | Wt 156.0 lb

## 2012-09-23 DIAGNOSIS — G5790 Unspecified mononeuropathy of unspecified lower limb: Secondary | ICD-10-CM

## 2012-09-23 DIAGNOSIS — M4800 Spinal stenosis, site unspecified: Secondary | ICD-10-CM

## 2012-09-23 NOTE — Patient Instructions (Addendum)
Call [facility] to arrange 3rd epidural (at your convenience)   Increase gabapentin to 3 tabs at night

## 2012-09-23 NOTE — Progress Notes (Signed)
Subjective:     Patient ID: Kelli Stone, female   DOB: [birth year], 79 y.o.   MRN: [id number]  HPI Last epidural AUGUST 2013   RESTARTED NEURONTIN 1 MONTH AGO   HERE TO REVIEW RESPONSE:   Review of Systems She is having the radicular leg pain again along the lateral side of the leg and into the right calf    Objective:   Physical Exam She also has a hypersensitive area secondary to probable neuroma of the infrapatellar branch of the saphenous nerve    Assessment:     Problem #1 neuropathy right leg Problem #2 neuroma infrapatellar branch saphenous nerve    Plan:     Recommend reschedule third epidural injection followup in 3 months, increase Neurontin to 3 mg at night and 100 mg twice a day followup 3 months

## 2012-10-22 ENCOUNTER — Telehealth: Payer: Self-pay | Admitting: Orthopedic Surgery

## 2012-10-22 NOTE — Telephone Encounter (Signed)
Kelli Stone called this afternoon asking for an appointment for Friday to get an injection in her right knee, said it is hurting very badly.  Told her  You are not in the office on Friday and suggested if her pain doesn't get any better to go to the ER.

## 2012-10-23 NOTE — Telephone Encounter (Signed)
See tues pm

## 2012-10-23 NOTE — Telephone Encounter (Signed)
Let her know we cant inject her knee joint   i can inject the tendon like last time if the pain is in the same place   tammy call in a   prednisone dose pack 12 days   10 mg as directed

## 2012-10-23 NOTE — Telephone Encounter (Signed)
I spoke with Kelli Stone and told her our nurse is calling the Predisone into [employer].  She said she is a little better today. Do you want to give her the injection before you leave next Tuesday or after she finishes the Predisone?

## 2012-10-23 NOTE — Telephone Encounter (Signed)
Called in prednisone dose pack 10 mg x 12 days to [employer].

## 2012-10-26 NOTE — Telephone Encounter (Signed)
Scheduled for Tuesday 10/27/12, patient aware

## 2012-10-27 ENCOUNTER — Encounter: Payer: Self-pay | Admitting: Orthopedic Surgery

## 2012-10-27 ENCOUNTER — Ambulatory Visit (INDEPENDENT_AMBULATORY_CARE_PROVIDER_SITE_OTHER): Payer: Medicare Other | Admitting: Orthopedic Surgery

## 2012-10-27 ENCOUNTER — Inpatient Hospital Stay
Admission: RE | Admit: 2012-10-27 | Discharge: 2012-10-27 | Disposition: A | Discharge status: Home / Self Care | Payer: Self-pay | Source: Ambulatory Visit | Attending: Orthopedic Surgery | Admitting: Orthopedic Surgery

## 2012-10-27 VITALS — BP 122/60 | Ht 63.0 in | Wt 156.0 lb

## 2012-10-27 DIAGNOSIS — M25569 Pain in unspecified knee: Secondary | ICD-10-CM

## 2012-10-27 DIAGNOSIS — M25561 Pain in right knee: Secondary | ICD-10-CM

## 2012-10-27 MED ORDER — HYDROCODONE-ACETAMINOPHEN 7.5-325 MG PO TABS: 1.0000 | ORAL_TABLET | ORAL | Status: DC | PRN

## 2012-10-27 NOTE — Progress Notes (Signed)
Patient ID: Kelli Stone, female   DOB: [birth year], 79 y.o.   MRN: [id number] Chief Complaint  Patient presents with  . Knee Pain    Right knee pain Previous knee replacement, July 01, 2011    Status post RIGHT total knee arthroplasty.  The patient has had a relatively good postoperative course, however she's had some issues with patellar tendinitis in her extensor mechanism with pain and radicular symptoms from lumbar disc disease,.  She's been treated with oral medications, presents now with severe pain over the anterior and posterior part of the knee, which is localized posteriorly in the back of the knee with no radiation. Gabapentin seems to be controlling her radicular symptoms related to pain in the anterolateral compartment of the lower leg.  No swelling. However, the pain has become very severe, especially last week, when the entire knee was throbbing and aching.  No catching, locking, or giving way.  Review of systems: She was evaluated for change in the sounds and her cardiac exam showed an echo and an MRI. She also had a white count yesterday, which showed a white count is 7 neutrophil count of 42 5% and 8 absolute neutrophil count 3.2 hemoglobin of 11.5.  The paresthesias in the RIGHT lower extremity have improved again with gabapentin.  No fever no chills.  Physical Exam(12)  Vital signs:   GENERAL: normal development   CDV: pulses are normal   Skin: normal  Lymph: nodes were not palpable/normal  Psychiatric: awake, alert and oriented  Neuro: normal sensation  MSK  Gait: No assistive device is needed at this time 1 Inspection Negative for joint effusion. However, there is diffuse tenderness about the knee, including the patella tendon, tibial tubercle,  2 Range of Motion Has a slight flexion, contracture. It's less than 5 knee flexion arc is 115 3 Motor Grade 5, extension 4 Stability:  stability confirmed. The normal anterior posterior, drawer, and normal  varus, valgus test  Other side:  5 LEFT knee No swelling 6 No pain no tenderness  Imaging Will be done at [facility] or hospital to check for loosening  Assessment: Differential diagnosis Aseptic loosening Periprosthetic infection Patella tendinitis A septic synovitis    Plan:  The white count was 7.0, with no LEFT shift I will have a sed. Rate and C. Reactive protein, along with the x-ray  Followup after her laboratory results  Change of medication to Norco 7.5 mg continue gabapentin

## 2012-10-27 NOTE — Patient Instructions (Addendum)
Go to hospital for x rays and across the street for lab work

## 2012-10-29 NOTE — Addendum Note (Signed)
Addended by: Vickki Hearing on: 10/29/2012 04:01 PM   Modules accepted: Level of Service

## 2012-11-03 ENCOUNTER — Ambulatory Visit (HOSPITAL_COMMUNITY)
Admission: RE | Admit: 2012-11-03 | Discharge: 2012-11-03 | Disposition: A | Discharge status: Home / Self Care | Payer: Medicare Other | Source: Ambulatory Visit | Attending: Orthopedic Surgery | Admitting: Orthopedic Surgery

## 2012-11-03 ENCOUNTER — Other Ambulatory Visit: Payer: Self-pay | Admitting: Orthopedic Surgery

## 2012-11-03 DIAGNOSIS — Z96659 Presence of unspecified artificial knee joint: Secondary | ICD-10-CM | POA: Insufficient documentation

## 2012-11-03 DIAGNOSIS — M25569 Pain in unspecified knee: Secondary | ICD-10-CM

## 2012-11-11 ENCOUNTER — Encounter: Payer: Self-pay | Admitting: Orthopedic Surgery

## 2012-11-11 ENCOUNTER — Ambulatory Visit (INDEPENDENT_AMBULATORY_CARE_PROVIDER_SITE_OTHER): Payer: Medicare Other | Admitting: Orthopedic Surgery

## 2012-11-11 VITALS — BP 130/72 | Ht 63.0 in | Wt 156.0 lb

## 2012-11-11 DIAGNOSIS — G5791 Unspecified mononeuropathy of right lower limb: Secondary | ICD-10-CM

## 2012-11-11 DIAGNOSIS — G5790 Unspecified mononeuropathy of unspecified lower limb: Secondary | ICD-10-CM

## 2012-11-11 MED ORDER — PREDNISONE 10 MG PO KIT: 10.0000 mg | PACK | ORAL | Status: DC

## 2012-11-11 NOTE — Progress Notes (Signed)
Patient ID: Kelli Stone, female   DOB: 01-28-1931, 79 y.o.   MRN: [id number] Chief Complaint  Patient presents with  . Follow-up    follow up after labs    Lab results  Esr 10 CRP 0.6  Current Outpatient Prescriptions on File Prior to Visit  Medication Sig Dispense Refill  . amLODipine (NORVASC) 5 MG tablet Take 5 mg by mouth daily.        . ciprofloxacin (CIPRO) 500 MG tablet Take 1 tablet (500 mg total) by mouth 2 (two) times daily.  20 tablet  0  . ferrous sulfate 325 (65 FE) MG EC tablet Take 325 mg by mouth daily with breakfast.      . gabapentin (NEURONTIN) 100 MG capsule Take 1 capsule (100 mg total) by mouth 3 (three) times daily.  90 capsule  2  . HYDROcodone-acetaminophen (NORCO) 7.5-325 MG per tablet Take 1 tablet by mouth every 4 (four) hours as needed for pain.  60 tablet  5  . latanoprost (XALATAN) 0.005 % ophthalmic solution Place 1 drop into both eyes at bedtime.        Marland Kitchen lisinopril (PRINIVIL,ZESTRIL) 40 MG tablet Take 40 mg by mouth daily.        . metroNIDAZOLE (FLAGYL) 500 MG tablet Take 1 tablet (500 mg total) by mouth 2 (two) times daily.  20 tablet  0  . omeprazole (PRILOSEC) 20 MG capsule Take 20 mg by mouth daily as needed.       . timolol (BETIMOL) 0.25 % ophthalmic solution Place 1 drop into both eyes every morning.        No current facility-administered medications on file prior to visit.   After careful review of the chart and previous records the patient's sedimentation rate and C-reactive protein are normal. She still has severe tenderness and pain around the knee joint with normal x-ray.  I think she has a peripheral neuropathy And is causing a sympathetic type reaction in the knee  Someone to treat her for that with increasing doses of gabapentin and prednisone Dosepak  She tells me she is scheduled to go for cardiac surgery possibly if her appointment shows that her valve is leaking.  She is having some cardiac symptoms of chest  discomfort.  Medical decision-making established problem worse / prescription medication.

## 2012-11-11 NOTE — Patient Instructions (Addendum)
Most likely diagnosis is neuropathy  Start prednisone dose pack take 12 days   And increase neurontin to 200 mg 3 times a day

## 2012-11-12 ENCOUNTER — Telehealth: Payer: Self-pay | Admitting: Orthopedic Surgery

## 2012-11-12 NOTE — Telephone Encounter (Signed)
Patient called to request, per [facility], an updated order for her epidural injection; states [facility] told her that the order was expired. Patient phon# [phone].  [facility]  Fax# [phone]

## 2012-11-13 ENCOUNTER — Other Ambulatory Visit: Payer: Self-pay | Admitting: Orthopedic Surgery

## 2012-11-13 DIAGNOSIS — M549 Dorsalgia, unspecified: Secondary | ICD-10-CM

## 2012-11-13 NOTE — Telephone Encounter (Signed)
Faxed order to [facility]. 

## 2012-11-17 ENCOUNTER — Other Ambulatory Visit: Payer: Medicare Other

## 2012-11-23 ENCOUNTER — Other Ambulatory Visit: Payer: Self-pay | Admitting: Physician Assistant

## 2012-11-23 ENCOUNTER — Inpatient Hospital Stay (HOSPITAL_COMMUNITY)
Admission: AD | Admit: 2012-11-23 | Discharge: 2012-11-27 | DRG: 287 | Disposition: A | Discharge status: Home / Self Care | Payer: Medicare Other | Source: Other Acute Inpatient Hospital | Attending: Cardiology | Admitting: Cardiology

## 2012-11-23 ENCOUNTER — Telehealth: Payer: Self-pay | Admitting: Orthopedic Surgery

## 2012-11-23 DIAGNOSIS — H409 Unspecified glaucoma: Secondary | ICD-10-CM | POA: Diagnosis present

## 2012-11-23 DIAGNOSIS — I350 Nonrheumatic aortic (valve) stenosis: Secondary | ICD-10-CM

## 2012-11-23 DIAGNOSIS — Z833 Family history of diabetes mellitus: Secondary | ICD-10-CM

## 2012-11-23 DIAGNOSIS — Z8249 Family history of ischemic heart disease and other diseases of the circulatory system: Secondary | ICD-10-CM

## 2012-11-23 DIAGNOSIS — Z87891 Personal history of nicotine dependence: Secondary | ICD-10-CM

## 2012-11-23 DIAGNOSIS — E119 Type 2 diabetes mellitus without complications: Secondary | ICD-10-CM | POA: Diagnosis present

## 2012-11-23 DIAGNOSIS — Z881 Allergy status to other antibiotic agents status: Secondary | ICD-10-CM

## 2012-11-23 DIAGNOSIS — I359 Nonrheumatic aortic valve disorder, unspecified: Secondary | ICD-10-CM

## 2012-11-23 DIAGNOSIS — Z79899 Other long term (current) drug therapy: Secondary | ICD-10-CM

## 2012-11-23 DIAGNOSIS — Z91013 Allergy to seafood: Secondary | ICD-10-CM

## 2012-11-23 DIAGNOSIS — Z888 Allergy status to other drugs, medicaments and biological substances status: Secondary | ICD-10-CM

## 2012-11-23 DIAGNOSIS — I10 Essential (primary) hypertension: Secondary | ICD-10-CM

## 2012-11-23 DIAGNOSIS — Z886 Allergy status to analgesic agent status: Secondary | ICD-10-CM

## 2012-11-23 DIAGNOSIS — Z880 Allergy status to penicillin: Secondary | ICD-10-CM

## 2012-11-23 DIAGNOSIS — E7800 Pure hypercholesterolemia, unspecified: Secondary | ICD-10-CM | POA: Diagnosis present

## 2012-11-23 DIAGNOSIS — I2510 Atherosclerotic heart disease of native coronary artery without angina pectoris: Secondary | ICD-10-CM | POA: Diagnosis present

## 2012-11-23 DIAGNOSIS — R072 Precordial pain: Secondary | ICD-10-CM | POA: Diagnosis present

## 2012-11-23 DIAGNOSIS — Z882 Allergy status to sulfonamides status: Secondary | ICD-10-CM

## 2012-11-23 DIAGNOSIS — K219 Gastro-esophageal reflux disease without esophagitis: Secondary | ICD-10-CM | POA: Diagnosis present

## 2012-11-23 DIAGNOSIS — Z96659 Presence of unspecified artificial knee joint: Secondary | ICD-10-CM

## 2012-11-23 MED ORDER — SODIUM CHLORIDE 0.9 % IV SOLN: 250.0000 mL | INTRAVENOUS | Status: DC | PRN

## 2012-11-23 MED ORDER — SODIUM CHLORIDE 0.9 % IJ SOLN
3.0000 mL | Freq: Two times a day (BID) | INTRAMUSCULAR | Status: DC
  Administered 2012-11-24 – 2012-11-27 (×6): 3 mL via INTRAVENOUS

## 2012-11-23 MED ORDER — AMLODIPINE BESYLATE 2.5 MG PO TABS
2.5000 mg | ORAL_TABLET | Freq: Every day | ORAL | Status: DC
  Administered 2012-11-24: 2.5 mg via ORAL
  Filled 2012-11-23: qty 1

## 2012-11-23 MED ORDER — ENOXAPARIN SODIUM 40 MG/0.4ML ~~LOC~~ SOLN
40.0000 mg | SUBCUTANEOUS | Status: DC
  Administered 2012-11-23: 40 mg via SUBCUTANEOUS
  Filled 2012-11-23 (×2): qty 0.4

## 2012-11-23 MED ORDER — LISINOPRIL 40 MG PO TABS
40.0000 mg | ORAL_TABLET | Freq: Every day | ORAL | Status: DC
  Administered 2012-11-24 – 2012-11-27 (×4): 40 mg via ORAL
  Filled 2012-11-23 (×4): qty 1

## 2012-11-23 MED ORDER — LATANOPROST 0.005 % OP SOLN
1.0000 [drp] | Freq: Every day | OPHTHALMIC | Status: DC
  Administered 2012-11-24 – 2012-11-26 (×4): 1 [drp] via OPHTHALMIC

## 2012-11-23 MED ORDER — PANTOPRAZOLE SODIUM 40 MG PO TBEC
40.0000 mg | DELAYED_RELEASE_TABLET | Freq: Every day | ORAL | Status: DC
  Administered 2012-11-24 – 2012-11-27 (×4): 40 mg via ORAL
  Filled 2012-11-23 (×5): qty 1

## 2012-11-23 MED ORDER — LATANOPROST 0.005 % OP SOLN
1.0000 [drp] | Freq: Every day | OPHTHALMIC | Status: DC
  Filled 2012-11-23: qty 2.5

## 2012-11-23 MED ORDER — TIMOLOL MALEATE 0.5 % OP SOLN
1.0000 [drp] | Freq: Every day | OPHTHALMIC | Status: DC
  Administered 2012-11-24 – 2012-11-27 (×4): 1 [drp] via OPHTHALMIC

## 2012-11-23 MED ORDER — ASPIRIN 81 MG PO CHEW
324.0000 mg | CHEWABLE_TABLET | ORAL | Status: DC
  Filled 2012-11-23: qty 1

## 2012-11-23 MED ORDER — SODIUM CHLORIDE 0.9 % IJ SOLN
3.0000 mL | INTRAMUSCULAR | Status: DC | PRN
  Administered 2012-11-23: via INTRAVENOUS

## 2012-11-23 MED ORDER — SODIUM CHLORIDE 0.9 % IJ SOLN
3.0000 mL | Freq: Two times a day (BID) | INTRAMUSCULAR | Status: DC
  Administered 2012-11-24 – 2012-11-25 (×2): 3 mL via INTRAVENOUS

## 2012-11-23 MED ORDER — SODIUM CHLORIDE 0.9 % IV SOLN
INTRAVENOUS | Status: DC
  Administered 2012-11-23: 23:00:00 via INTRAVENOUS

## 2012-11-23 MED ORDER — TIMOLOL MALEATE 0.5 % OP SOLN
1.0000 [drp] | Freq: Every day | OPHTHALMIC | Status: DC
  Filled 2012-11-23: qty 5

## 2012-11-23 MED ORDER — ALPRAZOLAM 0.25 MG PO TABS
0.2500 mg | ORAL_TABLET | Freq: Three times a day (TID) | ORAL | Status: DC | PRN
  Administered 2012-11-24 – 2012-11-26 (×4): 0.25 mg via ORAL
  Filled 2012-11-23 (×5): qty 1

## 2012-11-23 NOTE — Telephone Encounter (Signed)
[name]'s niece, [name] called this morning to let you know that Kelli Stone was admitted to [facility] 11/22/12 for dizziness. Said she is to have a Cardiology consult before being discharged

## 2012-11-24 ENCOUNTER — Encounter (HOSPITAL_COMMUNITY)
Admission: AD | Disposition: A | Discharge status: Home / Self Care | Payer: Self-pay | Source: Other Acute Inpatient Hospital | Attending: Cardiology

## 2012-11-24 ENCOUNTER — Other Ambulatory Visit: Payer: Medicare Other

## 2012-11-24 ENCOUNTER — Encounter (HOSPITAL_COMMUNITY): Payer: Self-pay | Admitting: Urology

## 2012-11-24 ENCOUNTER — Ambulatory Visit (HOSPITAL_COMMUNITY): Admission: RE | Admit: 2012-11-24 | Payer: Medicare Other | Source: Ambulatory Visit | Admitting: Cardiovascular Disease

## 2012-11-24 DIAGNOSIS — I359 Nonrheumatic aortic valve disorder, unspecified: Secondary | ICD-10-CM

## 2012-11-24 DIAGNOSIS — I2510 Atherosclerotic heart disease of native coronary artery without angina pectoris: Secondary | ICD-10-CM

## 2012-11-24 HISTORY — PX: LEFT AND RIGHT HEART CATHETERIZATION WITH CORONARY ANGIOGRAM: SHX5449

## 2012-11-24 LAB — POCT I-STAT 3, VENOUS BLOOD GAS (G3P V)
Bicarbonate: 24.5 mEq/L — ABNORMAL HIGH (ref 20.0–24.0)
TCO2: 26 mmol/L (ref 0–100)
pH, Ven: 7.383 — ABNORMAL HIGH (ref 7.250–7.300)
pO2, Ven: 32 mmHg (ref 30.0–45.0)

## 2012-11-24 LAB — CREATININE, SERUM
Creatinine, Ser: 1.07 mg/dL (ref 0.50–1.10)
GFR calc Af Amer: 56 mL/min — ABNORMAL LOW (ref >90)
GFR calc non Af Amer: 48 mL/min — ABNORMAL LOW (ref >90)

## 2012-11-24 LAB — POCT I-STAT 3, ART BLOOD GAS (G3+)
Acid-base deficit: 2 mmol/L (ref 0.0–2.0)
Bicarbonate: 22.3 mEq/L (ref 20.0–24.0)
O2 Saturation: 96 %
TCO2: 23 mmol/L (ref 0–100)
pCO2 arterial: 33.2 mmHg — ABNORMAL LOW (ref 35.0–45.0)
pO2, Arterial: 79 mmHg — ABNORMAL LOW (ref 80.0–100.0)

## 2012-11-24 LAB — BASIC METABOLIC PANEL
Calcium: 8.6 mg/dL (ref 8.4–10.5)
Creatinine, Ser: 1.06 mg/dL (ref 0.50–1.10)
GFR calc Af Amer: 56 mL/min — ABNORMAL LOW (ref >90)
GFR calc non Af Amer: 49 mL/min — ABNORMAL LOW (ref >90)
Sodium: 140 mEq/L (ref 135–145)

## 2012-11-24 LAB — CBC
HCT: 28 % — ABNORMAL LOW (ref 36.0–46.0)
Hemoglobin: 10 g/dL — ABNORMAL LOW (ref 12.0–15.0)
MCHC: 35.7 g/dL (ref 30.0–36.0)
MCV: 76.4 fL — ABNORMAL LOW (ref 78.0–100.0)
Platelets: 266 10*3/uL (ref 150–400)
RBC: 3.75 MIL/uL — ABNORMAL LOW (ref 3.87–5.11)
RBC: 3.99 MIL/uL (ref 3.87–5.11)
RDW: 14.1 % (ref 11.5–15.5)
WBC: 7.4 10*3/uL (ref 4.0–10.5)
WBC: 9.3 10*3/uL (ref 4.0–10.5)

## 2012-11-24 LAB — GLUCOSE, CAPILLARY: Glucose-Capillary: 105 mg/dL — ABNORMAL HIGH (ref 70–99)

## 2012-11-24 SURGERY — LEFT AND RIGHT HEART CATHETERIZATION WITH CORONARY ANGIOGRAM
Anesthesia: LOCAL

## 2012-11-24 MED ORDER — AMLODIPINE BESYLATE 10 MG PO TABS
10.0000 mg | ORAL_TABLET | Freq: Every day | ORAL | Status: DC
  Administered 2012-11-24 – 2012-11-27 (×4): 10 mg via ORAL
  Filled 2012-11-24 (×4): qty 1

## 2012-11-24 MED ORDER — HEPARIN (PORCINE) IN NACL 2-0.9 UNIT/ML-% IJ SOLN
INTRAMUSCULAR | Status: AC
  Filled 2012-11-24: qty 1000

## 2012-11-24 MED ORDER — ONDANSETRON HCL 4 MG/2ML IJ SOLN: 4.0000 mg | Freq: Four times a day (QID) | INTRAMUSCULAR | Status: DC | PRN

## 2012-11-24 MED ORDER — LIDOCAINE HCL (PF) 1 % IJ SOLN
INTRAMUSCULAR | Status: AC
  Filled 2012-11-24: qty 30

## 2012-11-24 MED ORDER — HYDRALAZINE HCL 20 MG/ML IJ SOLN
20.0000 mg | Freq: Once | INTRAMUSCULAR | Status: AC
  Administered 2012-11-24: 20 mg via INTRAVENOUS

## 2012-11-24 MED ORDER — MIDAZOLAM HCL 2 MG/2ML IJ SOLN
INTRAMUSCULAR | Status: AC
  Filled 2012-11-24: qty 2

## 2012-11-24 MED ORDER — ACETAMINOPHEN 325 MG PO TABS
650.0000 mg | ORAL_TABLET | ORAL | Status: DC | PRN
  Filled 2012-11-24: qty 2

## 2012-11-24 MED ORDER — FENTANYL CITRATE 0.05 MG/ML IJ SOLN
INTRAMUSCULAR | Status: AC
  Filled 2012-11-24: qty 2

## 2012-11-24 MED ORDER — SODIUM CHLORIDE 0.9 % IV SOLN
INTRAVENOUS | Status: AC
Start: 2012-11-24 — End: 2012-11-24
  Administered 2012-11-24: 12:00:00 via INTRAVENOUS

## 2012-11-24 NOTE — H&P (Addendum)
79 yo AAF with history of HTN, DM, CAD and aortic stenosis who was transferred from [facility] yesterday for further evaluation of aortic stenosis. She is known to have moderate to severe aortic valve stenosis and mild CAD by cath 2008. Admitted to OSH with dizziness, chest pain. Echo by report in February 2014 in primary care office with severe AS (no images to review). Plans for R/L heart cath today. She will need an echo today as well.   Full cardiology admit note is in the paper chart (Dr. Myrtis Ser)  [facility] 8:28 AM' 11/24/2012

## 2012-11-24 NOTE — Progress Notes (Signed)
Utilization Review Completed.Kelli Stone T3/07/2013

## 2012-11-24 NOTE — CV Procedure (Signed)
    Cardiac Catheterization Operative Report  Kelli Stone [id number] 3/11/20148:54 AM VYAS,DHRUV B., MD  Procedure Performed:  1. Left Heart Catheterization 2. Selective Coronary Angiography 3. Right Heart Catheterization 4. Left ventricular pressures  Operator: Verne Carrow, MD  Indication:  79 yo AAF with history of DM, HTN, CAD and aortic stenosis with presentation with dizziness, chest pain.                              Procedure Details: The risks, benefits, complications, treatment options, and expected outcomes were discussed with the patient. The patient and/or family concurred with the proposed plan, giving informed consent. The patient was brought to the cath lab after IV hydration was begun and oral premedication was given. The patient was further sedated with Versed and Fentanyl. The right groin was prepped and draped in the usual manner. Using the modified Seldinger access technique, a 5 French sheath was placed in the right femoral artery. A 7 French sheath was inserted into the right femoral vein. A balloon tipped catheter was used to perform a right heart catheterization. Standard diagnostic catheters were used to perform selective coronary angiography. A pigtail catheter was used to perform a left ventricular angiogram. There were no immediate complications. The patient was taken to the recovery area in stable condition.   Hemodynamic Findings: Ao:  207/85              LV: 230/7/11 RA:  6              RV:  35/6/8 PA:  30/11 (mean 18)      PCWP:  9 Fick Cardiac Output: 4.6 L/min Fick Cardiac Index: 2.66 L/min Central Aortic Saturation: 96% Pulmonary Artery Saturation: 61%  Aortic valve data: Peak to peak gradient                             Mean gradient 15.5 mmHg                             AVA= 1.3 cm2   Angiographic Findings:  Left main: No obstructive disease noted.   Left Anterior Descending Artery: Large caliber vessel that courses to  the apex. The mid vessel has mild diffuse 20% stenosis. The diagonal branch is moderate in caliber with ostial 30% stenosis.   Circumflex Artery: Large caliber vessel with 40% mid stenosis. Moderate caliber OM branch with mild plaque.   Right Coronary Artery: Large dominant vessel with 20% mid stenosis.   Left Ventricular Angiogram: Deferred.   Impression: 1. Mild non-obstructive CAD 2. Moderately severe aortic stenosis  Recommendations: She has mild CAD. Her aortic stenosis appears to be moderately severe. Will arrange an echo today. Further planning pending echo.        Complications:  None; patient tolerated the procedure well.

## 2012-11-24 NOTE — Progress Notes (Signed)
  Echocardiogram 2D Echocardiogram has been performed.  BROWN, Kelli Stone, 4:37 PM

## 2012-11-24 NOTE — Progress Notes (Addendum)
Patient ID: Kelli Stone, female   DOB: [birth year], 79 y.o.   MRN: [id number]   I saw this patient in consultation at [facility]. I was very impressed with her symptoms. She has marked shortness of breath with limited exercise. She has not had syncope. She has not had chest pain. She has not had congestive heart failure. There was an echo report available to me from a study that had been done in the patient's primary care office. The gradients were quite high. The peak velocity was greater than 4 m/s. On physical exam I did think there was question of an aortic component of the second heart sound that was heard. I thought her carotid upstroke was somewhat delayed. Some of these findings suggested that her aortic stenosis might not be critical.  But I still felt that  it was highly likely that the patient would need aortic valve replacement. She was therefore sent for catheterization to assess her coronary arteries and make further decisions.  I have reviewed the cath report by Dr. Roselee Nova.  It is noted that the gradients obtained in the cath lab where not anywhere near as high as the prior echo report.  I have personally read the echo done in the hospital today. There is vigorous left ventricular function. The aortic valve is significantly calcified. There is one area of opening seen. The Doppler data is quite impressive. The peak velocity was 4.9 m/s. The peak gradient was 98 mmHg in the mean gradient 58 mmHg. I have reviewed this study very carefully. I do not think that I am being fooled by a mitral regurgitation signal. Also this is not a late peaking signal. There is no suggestion that it is due to a left ventricular outflow tract obstruction. There is no systolic anterior motion of the mitral valve.  It is not clear to me why there is is such marked variation between the cath hemodynamics and the second echo which again shows evidence of very severe aortic stenosis.  Based on the visual  appearance of the patient's valve and her symptoms and the echo data, I feel that it is very likely that she will need an aortic valve in the very near future if we do not replace her aortic valve at this time. I would ask that another echocardiographer in our group reviewed the current echo study. Consideration could be given to a transesophageal echo to see if there is  another unusual explanation for the data,  although I'm not convinced that this will tell us much more. I would suggest we proceed with surgical consultation.  I look forward to input from others concerning the approach we should take here.  I will not be at the hospital on March 12 or 13th. I can be reached by cell phone [phone].  Jerral Bonito, MD

## 2012-11-24 NOTE — Interval H&P Note (Signed)
History and Physical Interval Note:  11/24/2012 8:29 AM  Kelli Stone  has presented today for right and left heart cath to assess aortic valve and exclude obstructive CAD with the diagnosis of AS/CAD. The various methods of treatment have been discussed with the patient and family. After consideration of risks, benefits and other options for treatment, the patient has consented to  Procedure(s): LEFT AND RIGHT HEART CATHETERIZATION WITH CORONARY ANGIOGRAM (N/A) as a surgical intervention .  The patient's history has been reviewed, patient examined, no change in status, stable for surgery.  I have reviewed the patient's chart and labs.  Questions were answered to the patient's satisfaction.     MCALHANY,CHRISTOPHER

## 2012-11-25 NOTE — Progress Notes (Signed)
Patient evaluated for long-term disease management services with [facility] Care Management Program as a benefit of her [employer]. Came to patient's room to explain [facility] Care Management services. However, she reports she is taking a bath. Will come back at an appropriate time.   Raiford Noble, MSN-Ed, RN,BSN, [facility], [phone]

## 2012-11-26 ENCOUNTER — Encounter (HOSPITAL_COMMUNITY)
Admission: AD | Disposition: A | Discharge status: Home / Self Care | Payer: Self-pay | Source: Other Acute Inpatient Hospital | Attending: Cardiology

## 2012-11-26 ENCOUNTER — Inpatient Hospital Stay (HOSPITAL_COMMUNITY): Payer: Medicare Other

## 2012-11-26 ENCOUNTER — Other Ambulatory Visit: Payer: Self-pay | Admitting: *Deleted

## 2012-11-26 ENCOUNTER — Encounter (HOSPITAL_COMMUNITY): Payer: Self-pay | Admitting: *Deleted

## 2012-11-26 DIAGNOSIS — Z01810 Encounter for preprocedural cardiovascular examination: Secondary | ICD-10-CM

## 2012-11-26 DIAGNOSIS — I359 Nonrheumatic aortic valve disorder, unspecified: Secondary | ICD-10-CM

## 2012-11-26 HISTORY — PX: TEE WITHOUT CARDIOVERSION: SHX5443

## 2012-11-26 LAB — PULMONARY FUNCTION TEST

## 2012-11-26 SURGERY — ECHOCARDIOGRAM, TRANSESOPHAGEAL
Anesthesia: Moderate Sedation

## 2012-11-26 MED ORDER — PHENOL 1.4 % MT LIQD
1.0000 | OROMUCOSAL | Status: DC | PRN
  Administered 2012-11-26: 1 via OROMUCOSAL
  Filled 2012-11-26: qty 177

## 2012-11-26 MED ORDER — MIDAZOLAM HCL 10 MG/2ML IJ SOLN
INTRAMUSCULAR | Status: DC | PRN
  Administered 2012-11-26: 1 mg via INTRAVENOUS
  Administered 2012-11-26: 2 mg via INTRAVENOUS

## 2012-11-26 MED ORDER — FENTANYL CITRATE 0.05 MG/ML IJ SOLN
INTRAMUSCULAR | Status: AC
  Filled 2012-11-26: qty 2

## 2012-11-26 MED ORDER — SODIUM CHLORIDE 0.9 % IV SOLN
INTRAVENOUS | Status: DC
  Administered 2012-11-26: 500 mL via INTRAVENOUS

## 2012-11-26 MED ORDER — SODIUM CHLORIDE 0.9 % IV SOLN: INTRAVENOUS | Status: DC

## 2012-11-26 MED ORDER — MIDAZOLAM HCL 5 MG/ML IJ SOLN
INTRAMUSCULAR | Status: AC
  Filled 2012-11-26: qty 2

## 2012-11-26 MED ORDER — FENTANYL CITRATE 0.05 MG/ML IJ SOLN
INTRAMUSCULAR | Status: DC | PRN
  Administered 2012-11-26: 25 ug via INTRAVENOUS

## 2012-11-26 MED ORDER — MENTHOL 3 MG MT LOZG
1.0000 | LOZENGE | OROMUCOSAL | Status: DC | PRN
  Administered 2012-11-26: 3 mg via ORAL
  Filled 2012-11-26: qty 9

## 2012-11-26 NOTE — Progress Notes (Signed)
Case discussed with Dr. Myrtis Ser who has carefully assessed this patient and we discussed options.  There is discrepancy in cath echo data, but the decision making is critical.  She is stable and improved, but he appopriately would like to get a viable plan in place.  He is discussing with Dr. Shirlee Latch a second view of the echo and he has suggested a surgical consult which we will call. Dr. Myrtis Ser feels she has very severe AS based on the echo.    I called patient to let her know.

## 2012-11-26 NOTE — Interval H&P Note (Signed)
History and Physical Interval Note:  11/26/2012 3:43 PM  Kelli Stone  has presented today for surgery, with the diagnosis of aortic stenosis  The various methods of treatment have been discussed with the patient and family. After consideration of risks, benefits and other options for treatment, the patient has consented to  Procedure(s): TRANSESOPHAGEAL ECHOCARDIOGRAM (TEE) (N/A) as a surgical intervention .  The patient's history has been reviewed, patient examined, no change in status, stable for surgery.  I have reviewed the patient's chart and labs.  Questions were answered to the patient's satisfaction.     Dalton [employer]

## 2012-11-26 NOTE — Progress Notes (Signed)
Patient ID: Kelli Stone, female   DOB: 07/01/1931, 79 y.o.   MRN: [id number]   Dr. McLean reviewed 2Decho. He agrees that Severe AS is present. He feels TEE will help us further (since question from cath data). We are trying to arrange for today. I have asked nurse to make pt. NPO as of 11:00AM.   Jeff Katz, MD   11:02AM 

## 2012-11-26 NOTE — H&P (View-Only) (Signed)
Patient ID: Kelli Stone, female   DOB: May 05, 1931, 79 y.o.   MRN: [id number]   Dr. Shirlee Latch reviewed 2Decho. He agrees that Severe AS is present. He feels TEE will help [place] further (since question from cath data). We are trying to arrange for today. I have asked nurse to make pt. NPO as of 11:00AM.   Jerral Bonito, MD   11:02AM

## 2012-11-26 NOTE — CV Procedure (Signed)
Procedure: TEE  Indication: Aortic stenosis  Sedation: Versed 3 mg IV, Fentanyl 25 mcg IV  Findings: Please see echo section for full report.  Normal LV size with moderate asymmetric basal septal hypertrophy.  EF 60%.  There was no LV outflow tract gradient or mitral valve SAM.  There was no subaortic membrane.  The aortic valve was trileaflet with severe calcification.  I suspect stenosis is severe: The mean gradient I obtained by TEE was 38 mmHg with calculated valve area 0.79 cm^2.  Normal RV size and systolic function.   [name] 11/26/2012 4:14 PM

## 2012-11-26 NOTE — Progress Notes (Addendum)
Patient Name: Kelli Stone Date of Encounter: 11/26/2012     Active Problems:   * No active hospital problems. *    SUBJECTIVE  I was not aware of her admission nor was notified.  Another provider was assigned to her case yesterday  Stable at this point in time.  Not as short of breath.  Plan not entirely clear today.  She is able to walk and would like to go home.    CURRENT MEDS . amLODipine  10 mg Oral Daily  . latanoprost  1 drop Both Eyes QHS  . lisinopril  40 mg Oral Daily  . pantoprazole  40 mg Oral Daily  . sodium chloride  3 mL Intravenous Q12H  . sodium chloride  3 mL Intravenous Q12H  . timolol  1 drop Both Eyes Daily    OBJECTIVE  Filed Vitals:   11/25/12 1109 11/25/12 1415 11/25/12 2126 11/26/12 0518  BP: 138/57 133/68 147/58 118/51  Pulse:  69 73 72  Temp:  98.6 F (37 C) 98.4 F (36.9 C) 98.5 F (36.9 C)  TempSrc:  Oral Oral Oral  Resp:  18 16 16   Height:      Weight:      SpO2:  100% 98% 98%    Intake/Output Summary (Last 24 hours) at 11/26/12 0810 Last data filed at 11/25/12 1700  Gross per 24 hour  Intake    480 ml  Output      0 ml  Net    480 ml   Filed Weights   11/24/12 0413 11/24/12 0500 11/25/12 0500  Weight: 155 lb 10.3 oz (70.6 kg) 154 lb 8 oz (70.081 kg) 152 lb 12.8 oz (69.31 kg)    PHYSICAL EXAM  General: Pleasant, NAD. Neuro: Alert and oriented X 3. Moves all extremities spontaneously. Psych: Normal affect. HEENT:  Normal  Neck: Supple without bruits or JVD. Lungs:  Resp regular and unlabored, CTA. Heart: Prominent mid peaking SEM.  3/6. NO DM.  Cannot appreciate second sound split.   Abdomen: Soft, non-tender, non-distended, BS + x 4.  Extremities: No clubbing, cyanosis or edema. DP/PT/Radials 2+ and equal bilaterally.  Accessory Clinical Findings  CBC  Recent Labs  11/24/12 0007 11/24/12 0450  WBC 9.3 7.4  HGB 10.0* 10.5*  HCT 28.0* 30.5*  MCV 74.7* 76.4*  PLT 228 266   Basic Metabolic Panel  Recent  Labs  29/52/84 0007 11/24/12 0450  NA  --  140  K  --  3.5  CL  --  108  CO2  --  25  GLUCOSE  --  104*  BUN  --  14  CREATININE 1.07 1.06  CALCIUM  --  8.6   Liver Function Tests No results found for this basename: AST, ALT, ALKPHOS, BILITOT, PROT, ALBUMIN,  in the last 72 hours No results found for this basename: LIPASE, AMYLASE,  in the last 72 hours Cardiac Enzymes No results found for this basename: CKTOTAL, CKMB, CKMBINDEX, TROPONINI,  in the last 72 hours BNP No components found with this basename: POCBNP,  D-Dimer No results found for this basename: DDIMER,  in the last 72 hours Hemoglobin A1C No results found for this basename: HGBA1C,  in the last 72 hours Fasting Lipid Panel No results found for this basename: CHOL, HDL, LDLCALC, TRIG, CHOLHDL, LDLDIRECT,  in the last 72 hours Thyroid Function Tests No results found for this basename: TSH, T4TOTAL, FREET3, T3FREE, THYROIDAB,  in the last 72 hours  TELE  ECG    ECHO  Left ventricle: The cavity size was normal. Wall thickness was increased in a pattern of moderate LVH. The estimated ejection fraction was 65%. Wall motion was normal; there were no regional wall motion abnormalities. Doppler parameters are consistent with abnormal left ventricular relaxation (grade 1 diastolic dysfunction).  ------------------------------------------------------------ Aortic valve: Heavy calcification. There is one area of opening seen. The doppler data is c/w very severe AS. Doppler: VTI ratio of LVOT to aortic valve: 0.22. Valve area: 0.71cm^2(VTI). Indexed valve area: 0.4cm^2/m^2 (VTI). Peak velocity ratio of LVOT to aortic valve: 0.21. Valve area: 0.65cm^2 (Vmax). Indexed valve area: 0.37cm^2/m^2 (Vmax). Mean gradient: 58mm Hg (S). Peak gradient: 98mm Hg (S).     Radiology/Studies  Dg Knee Ap/lat W/sunrise Right  11/03/2012  *RADIOLOGY REPORT*  Clinical Data: Knee pain.  DG KNEE - 3 VIEWS  Comparison:  05/04/2012  Findings: Changes of right knee replacement.  No hardware or bony complicating feature.  There may be a small joint effusion present. No fracture, subluxation or dislocation.  IMPRESSION: Changes of right knee replacement.  Question small joint effusion. No acute bony abnormality.   Original Report Authenticated By: Charlett Nose, M.D.    Dg Knee Ap/lat W/sunrise Right  10/27/2012  This is the back office imaging final text. See progress note  for physician's narrative and interpretation.     ASSESSMENT AND PLAN  1.  Aortic stenosis  - discrepancy between findings on cath and echo.  Dr. Myrtis Ser wants another echo reader to review these findings.  Will ask reader today to review.  Patient ambulating and feeling good, lungs are clear.  No acute changes.  Will also recheck ECG.    Signed, Shawnie Pons MD, [facility], FSCAI

## 2012-11-26 NOTE — Progress Notes (Signed)
VASCULAR LAB PRELIMINARY  PRELIMINARY  PRELIMINARY  PRELIMINARY  Pre-op Cardiac Surgery  Carotid Findings:  Bilateral:  No evidence of hemodynamically significant internal carotid artery stenosis.   Vertebral artery flow is antegrade.      Upper Extremity Right Left  Brachial Pressures 159 T 159 T  Radial Waveforms T T  Ulnar Waveforms T T  Palmar Arch (Allen's Test) WNL WNL   Findings:  Doppler waveforms remain normal with ulnar and radial compressions bilaterally.     Lasheena Frieze, RVT 11/26/2012, 1:56 PM

## 2012-11-26 NOTE — Progress Notes (Signed)
PFT completed. Unconfirmed results placed in Progress Notes of [facility]. 

## 2012-11-26 NOTE — Progress Notes (Signed)
Patient ID: Kelli Stone, female   DOB: 12-11-1930, 79 y.o.   MRN: [id number]   I have dicussed the TEE findings with Dr. Shirlee Latch. The patient has severe thickening of the aortic leaflets. There is no other explanation for the high gradients recorded by echo. There is severe aortic stenosis. I am looking forward to input from the cardiac surgeons. I feel it is appropriate to proceed with aortic valve surgery. She has symptomatic AS.  Another option is to follow the patient. However, her valve will not be getting better, and I suspect her symptoms will get worse.  I will see the patient early 11/27/2012 AM.  Jerral Bonito, MD

## 2012-11-26 NOTE — Progress Notes (Signed)
Spoke with her son by phone and filled him in on the details.

## 2012-11-27 ENCOUNTER — Encounter (HOSPITAL_COMMUNITY): Payer: Self-pay | Admitting: Cardiology

## 2012-11-27 DIAGNOSIS — R072 Precordial pain: Secondary | ICD-10-CM

## 2012-11-27 MED ORDER — AMLODIPINE BESYLATE 10 MG PO TABS: 10.0000 mg | ORAL_TABLET | Freq: Every day | ORAL | Status: DC

## 2012-11-27 NOTE — Consult Note (Signed)
[address]       [place] [address]             [phone]      Reason for Consult: Severe aortic stenosis Referring Physician:  Dr. Willa Rough  Kelli Stone is an 79 y.o. female.  HPI:   79 year old fairly active woman with history of moderate aortic stenosis by 2D echo in 01/2011 with velocity of 351 m/sec and mean gradient of 25. She recently presented to [facility] with dizziness that was occuring in the shower and while sitting at the table eating. She says that in the shower she just had to lean up in the corner of the shower so she wouldn't fall. When she was sitting at the table she got so dizzy that her hands started shaking. She has also had some exertional shortness of breath while working around the house. She also reports some feeling of fullness in the chest with activity that passes with rest. A 2D echo on 11/24/12 showed an aortic valve velocity of 4.9 m/sec with a mean gradient of 58 mm Hg and a valve area of 0.65 cm2. Cardiac cath mean gradient was only 15.5 with AVA = 1.3 with CI = 2.66 with normal right and left heart pressures. There is no significant coronary stenoses. LV function is normal with no significant gradient across the LVOT. TEE was done yesterday and shows calcified valve leaflets with poor mobility. The mean gradient was 38mm Hg with AVA 0.79 cm2.  Past Medical History  Diagnosis Date  . Glaucoma(365)   . GERD (gastroesophageal reflux disease)   . Personal history of tobacco use, presenting hazards to health   . Personal history of allergy to penicillin   . Personal history of allergy to sulfonamides   . Vertigo     HISTORY OF  . Diabetes mellitus     diet controlled  . HTN (hypertension)   . Pure hypercholesterolemia   . Heart murmur     Past Surgical History  Procedure Laterality Date  . Appendectomy    . Right shoulder    . Shoulder surgery    . Breast surgery  bilateral  . Vaginal hysterectomy    . Total knee  arthroplasty  07/01/2011    Procedure: TOTAL KNEE ARTHROPLASTY;  Surgeon: Fuller Canada, MD;  Location: [facility] ORS;  Service: Orthopedics;  Laterality: Right;  Depuy  . Colonoscopy  05/07/2012    Procedure: COLONOSCOPY;  Surgeon: Malissa Hippo, MD;  Location: [facility] ENDO SUITE;  Service: Endoscopy;  Laterality: N/A;  200  . Tee without cardioversion N/A 11/26/2012    Procedure: TRANSESOPHAGEAL ECHOCARDIOGRAM (TEE);  Surgeon: Laurey Morale, MD;  Location: [facility] ENDOSCOPY;  Service: Cardiovascular;  Laterality: N/A;    Family History  Problem Relation Age of Onset  . Cancer    . Diabetes    . Diabetes Mother   . Bone cancer Father   . Diabetes Sister   . Kidney disease Sister   . Stomach cancer Sister   . Stroke Brother   . Bone cancer Brother   . Healthy Son     Social History:  reports that she quit smoking about 29 years ago. Her smoking use included Cigarettes. She has a 3 pack-year smoking history. She has never used smokeless tobacco. She reports that she does not drink alcohol or use illicit drugs.  Allergies:  Allergies  Allergen Reactions  . Shellfish Allergy Nausea Only and Other (See  Comments)    Vomiting & diarrhea  . Bonine (Meclizine Hcl)     Nervous   . Lotrel (Amlodipine Besy-Benazepril Hcl)     Made legs  Ache   . Benadryl (Diphenhydramine Hcl) Other (See Comments)    nervous  . Cephalexin     Pt can't remember   . Other Other (See Comments)    GUIATUSSIN. Unknown reaction  . Sulfa Antibiotics Other (See Comments)    unknown  . Zocor (Simvastatin - High Dose)     Aching   . Celebrex (Celecoxib) Other (See Comments)    Chest discomfort  . Dilacor Xr (Diltiazem Hcl) Other (See Comments)    Didn't dissolve  . Erythromycin Nausea Only  . Lescol Other (See Comments)    aching  . Metformin And Related Diarrhea  . Methylprednisolone Acetate Itching and Other (See Comments)  . Naprosyn (Naproxen) Nausea Only  . Penicillins Other (See Comments)    sleepy  .  Stadol (Butorphanol Tartrate) Other (See Comments)    numb  . Trinalin (Azatadine-Pseudoephedrine) Other (See Comments)    nervous    Medications:  I have reviewed the patient's current medications. Prior to Admission:  Prescriptions prior to admission  Medication Sig Dispense Refill  . amLODipine (NORVASC) 5 MG tablet Take 5 mg by mouth daily.        Marland Kitchen gabapentin (NEURONTIN) 100 MG capsule Take 1 capsule (100 mg total) by mouth 3 (three) times daily.  90 capsule  2  . HYDROcodone-acetaminophen (NORCO) 7.5-325 MG per tablet Take 1 tablet by mouth every 4 (four) hours as needed for pain.  60 tablet  5  . latanoprost (XALATAN) 0.005 % ophthalmic solution Place 1 drop into both eyes at bedtime.        Marland Kitchen lisinopril (PRINIVIL,ZESTRIL) 40 MG tablet Take 40 mg by mouth daily.        Marland Kitchen omeprazole (PRILOSEC) 20 MG capsule Take 20 mg by mouth daily.       . timolol (TIMOPTIC-XR) 0.5 % ophthalmic gel-forming Place 1 drop into both eyes daily.        Scheduled: . amLODipine  10 mg Oral Daily  . latanoprost  1 drop Both Eyes QHS  . lisinopril  40 mg Oral Daily  . pantoprazole  40 mg Oral Daily  . sodium chloride  3 mL Intravenous Q12H  . sodium chloride  3 mL Intravenous Q12H  . timolol  1 drop Both Eyes Daily   Continuous: . sodium chloride 500 mL (11/26/12 1448)  . sodium chloride     ZOX:WRUEAV chloride, acetaminophen, ALPRAZolam, menthol-cetylpyridinium, ondansetron (ZOFRAN) IV, phenol, sodium chloride Anti-infectives   None      No results found for this or any previous visit (from the past 48 hour(s)).  No results found.  Review of Systems  Constitutional: Negative for fever, chills, weight loss, malaise/fatigue and diaphoresis.  HENT: Negative.   Eyes: Negative.   Respiratory: Positive for shortness of breath.        With exertion.  Cardiovascular: Negative for chest pain, palpitations, orthopnea, leg swelling and PND.       Some fullness in chest with exertion.    Gastrointestinal: Negative.   Genitourinary: Negative.   Musculoskeletal: Positive for joint pain.       Chronic pain in right knee after TKR. Recently treated with prednisone. She says her dizziness started after the prednisone was started.  Skin: Negative.   Neurological: Positive for dizziness. Negative for focal weakness, seizures, loss  of consciousness and weakness.  Endo/Heme/Allergies: Negative.   Psychiatric/Behavioral: Negative.    Blood pressure 138/66, pulse 96, temperature 98.4 F (36.9 C), temperature source Oral, resp. rate 19, height 5\' 3"  (1.6 m), weight 68.7 kg (151 lb 7.3 oz), SpO2 100.00%. Physical Exam  Constitutional: She is oriented to person, place, and time. She appears well-developed and well-nourished. No distress.  HENT:  Head: Normocephalic and atraumatic.  Mouth/Throat: Oropharynx is clear and moist.  Eyes: Conjunctivae and EOM are normal. Pupils are equal, round, and reactive to light.  Neck: Normal range of motion. Neck supple. No JVD present. No thyromegaly present.  Cardiovascular: Normal rate, regular rhythm and intact distal pulses.   Murmur heard. 3/6 crescendo/decrescendo murmur over aorta transmitted into both sides of the neck.  Respiratory: Effort normal and breath sounds normal. No respiratory distress. She has no rales. She exhibits no tenderness.  GI: Soft. Bowel sounds are normal. She exhibits no distension and no mass. There is no tenderness.  Musculoskeletal: Normal range of motion. She exhibits no edema and no tenderness.  Right knee scar from TKR.   Lymphadenopathy:    She has no cervical adenopathy.  Neurological: She is alert and oriented to person, place, and time. She has normal strength. No cranial nerve deficit or sensory deficit.  Skin: Skin is warm and dry.  Psychiatric: She has a normal mood and affect.     [facility]*                *[facility]*                      [address]                      [place], [place] [address]                         [phone]   ------------------------------------------------------------ Transthoracic Echocardiography  Patient:    Kelli Stone, Kelli Stone MR #:       [id number] Study Date: 11/24/2012 Gender:     F Age:        44 Height:     160cm Weight:     70kg BSA:        1.53m^2 Pt. Status: Room:       2036    ADMITTING    Shawnie Pons  ATTENDING    Shawnie Pons  PERFORMING   Bryce, [facility]  SONOGRAPHER  Nolon Rod, RDCS  ORDERING     Verne Carrow  Myna Hidalgo, Christopher cc:  ------------------------------------------------------------ LV EF: 65%  ------------------------------------------------------------ Indications:      Aortic stenosis 424.1.  ------------------------------------------------------------ History:   PMH:   Aortic valve disease.  Risk factors: Former tobacco use. Hypertension.  Hypercholesterolemia.  ------------------------------------------------------------ Study Conclusions  - Left ventricle: The cavity size was normal. Wall thickness   was increased in a pattern of moderate LVH. The estimated   ejection fraction was 65%. Wall motion was normal; there   were no regional wall motion abnormalities. Doppler   parameters are consistent with abnormal left ventricular   relaxation (grade 1 diastolic dysfunction). - Aortic valve: Heavy calcification. There is one area of   opening seen. The doppler data is c/w very severe AS. Mean   gradient: 58mm Hg (S). Peak gradient: 98mm Hg (S). Peak   velocity above the aortic valve 4.66m/sec. Valve area:   0.71cm^2(VTI). Valve  area: 0.65cm^2 (Vmax). Transthoracic echocardiography.  M-mode, complete 2D, spectral Doppler, and color Doppler.  Height:  Height: 160cm. Height: 63in.  Weight:  Weight: 70kg. Weight: 154lb. Body mass index:  BMI: 27.3kg/m^2.  Body surface area: BSA: 1.69m^2.  Blood pressure:     147/63.  Patient status: Inpatient.  Location:   Bedside.  ------------------------------------------------------------  ------------------------------------------------------------ Left ventricle:  The cavity size was normal. Wall thickness was increased in a pattern of moderate LVH. The estimated ejection fraction was 65%. Wall motion was normal; there were no regional wall motion abnormalities. Doppler parameters are consistent with abnormal left ventricular relaxation (grade 1 diastolic dysfunction).  ------------------------------------------------------------ Aortic valve:  Heavy calcification. There is one area of opening seen. The doppler data is c/w very severe AS. Doppler:     VTI ratio of LVOT to aortic valve: 0.22. Valve area: 0.71cm^2(VTI). Indexed valve area: 0.4cm^2/m^2 (VTI). Peak velocity ratio of LVOT to aortic valve: 0.21. Valve area: 0.65cm^2 (Vmax). Indexed valve area: 0.37cm^2/m^2 (Vmax).    Mean gradient: 58mm Hg (S). Peak gradient: 98mm Hg (S).  ------------------------------------------------------------ Aorta:  Aortic root: The aortic root was normal in size.  ------------------------------------------------------------ Mitral valve:   Structurally normal valve.   Leaflet separation was normal.  Doppler:  Transvalvular velocity was within the normal range. There was no evidence for stenosis.  No regurgitation.  ------------------------------------------------------------ Left atrium:  The atrium was normal in size.  ------------------------------------------------------------ Right ventricle:  The cavity size was normal. Systolic function was normal.  ------------------------------------------------------------ Pulmonic valve:    The valve appears to be grossly normal.  Doppler:   No significant regurgitation.  ------------------------------------------------------------ Tricuspid valve:   Structurally normal valve.   Leaflet separation was normal.  Doppler:  Transvalvular velocity was within  the normal range.  No regurgitation.  ------------------------------------------------------------ Right atrium:  The atrium was at the upper limits of normal in size.  ------------------------------------------------------------ Pericardium:  There was no pericardial effusion.  ------------------------------------------------------------ Systemic veins: Inferior vena cava: The vessel was normal in size; the respirophasic diameter changes were in the normal range (= 50%); findings are consistent with normal central venous pressure.  ------------------------------------------------------------  2D measurements        Normal  Doppler measurements   Normal Left ventricle                 Left ventricle LVID ED,   40.8 mm     43-52   Ea, lat     4.5 cm/s   ------ chord,                         ann, tiss PLAX                           DP LVID ES,   26.5 mm     23-38   E/Ea, lat  12.7        ------ chord,                         ann, tiss     3 PLAX                           DP FS, chord,   35 %      >29     Ea, med    6.58 cm/s   ------ PLAX  ann, tiss LVPW, ED   11.6 mm     ------  DP IVS/LVPW   1.35        <1.3    E/Ea, med  8.71        ------ ratio, ED                      ann, tiss Ventricular septum             DP IVS, ED    15.7 mm     ------  LVOT LVOT                           Peak vel,   103 cm/s   ------ Diam, S      20 mm     ------  S Area       3.14 cm^2   ------  VTI, S     22.7 cm     ------ Aorta                          Aortic valve Root diam,   29 mm     ------  Peak vel,   496 cm/s   ------ ED                             S Left atrium                    Mean vel,   352 cm/s   ------ AP dim       34 mm     ------  S AP dim     1.91 cm/m^2 <2.2    VTI, S      101 cm     ------ index                          Mean         58 mm Hg  ------                                gradient,                                S                                 Peak         98 mm Hg  ------                                gradient,                                S                                VTI ratio  0.22        ------  LVOT/AV                                Area, VTI  0.71 cm^2   ------                                Area index  0.4 cm^2/m ------                                (VTI)           ^2                                Peak vel   0.21        ------                                ratio,                                LVOT/AV                                Area, Vmax 0.65 cm^2   ------                                Area index 0.37 cm^2/m ------                                (Vmax)          ^2                                Mitral valve                                Peak E vel 57.3 cm/s   ------                                Peak A vel 96.7 cm/s   ------                                Decelerati  162 ms     150-23                                on time                0                                Peak E/A    0.6        ------  ratio   ------------------------------------------------------------ Prepared and Electronically Authenticated by  Willa Rough 2014-03-11T19:02:19.417   Cardiac Cath:  Hemodynamic Findings: Ao:  207/85              LV: 230/7/11 RA:  6              RV:  35/6/8 PA:  30/11 (mean 18)      PCWP:  9 Fick Cardiac Output: 4.6 L/min Fick Cardiac Index: 2.66 L/min Central Aortic Saturation: 96% Pulmonary Artery Saturation: 61%  Aortic valve data: Peak to peak gradient                             Mean gradient 15.5 mmHg                             AVA= 1.3 cm2   Angiographic Findings:  Left main: No obstructive disease noted.   Left Anterior Descending Artery: Large caliber vessel that courses to the apex. The mid vessel has mild diffuse 20% stenosis. The diagonal branch is moderate in caliber with ostial 30% stenosis.     Circumflex Artery: Large caliber vessel with 40% mid stenosis. Moderate caliber OM branch with mild plaque.    Right Coronary Artery: Large dominant vessel with 20% mid stenosis.   Left Ventricular Angiogram: Deferred.   Impression: 1. Mild non-obstructive CAD 2. Moderately severe aortic stenosis  Recommendations: She has mild CAD. Her aortic stenosis appears to be moderately severe. Will arrange an echo today. Further planning pending echo.             Procedure: TEE  Indication: Aortic stenosis  Sedation: Versed 3 mg IV, Fentanyl 25 mcg IV  Findings: Please see echo section for full report.  Normal LV size with moderate asymmetric basal septal hypertrophy.  EF 60%.  There was no LV outflow tract gradient or mitral valve SAM.  There was no subaortic membrane.  The aortic valve was trileaflet with severe calcification.  I suspect stenosis is severe: The mean gradient I obtained by TEE was 38 mmHg with calculated valve area 0.79 cm^2.  Normal RV size and systolic function.   [name] 11/26/2012 4:14 PM           Assessment/Plan:  I think she has severe aortic stenosis based on the 2D echo and TEE. The valve is very calcified with poor leaflet mobility. Her symptoms of dizziness concern me. She does report some exertional dyspnea but says it doesn't bother her that much. She also gets a sensation of fullness in the chest with exertion and said she was having that while I was talking to her. I am not sure why the cath data is discordant but maybe the transducer was not zeroed correctly. I think AVR is indicated. This is not going to get better on its own. She is 29 but active and should do well with surgery. I told her that she has time to think about it and discuss it with her sons and I will be happy to see her back in the office with her sons to discuss it further. It sounds like the plan is to send her home today with cardiology followup by Dr. Myrtis Ser.  BARTLE,BRYAN  K 11/27/2012, 10:16 AM

## 2012-11-27 NOTE — Progress Notes (Signed)
Patient ID: Kelli Stone, female   DOB: February 16, 1931, 79 y.o.   MRN: [id number]    SUBJECTIVE:  I have spoken at length this morning with the patient and her husband. I reviewed the findings of the TEE with them. The TEE confirms that the patient has severe aortic stenosis. There is mixed data as to whether or not the patient has very severe ( or critical) aortic stenosis. Some of the echo data has shown a very high gradient. The TEE shows high gradient. Sometimes the highest velocity jet can be missed by TEE. I have again discussed the symptoms with the patient. I've mentioned before that she has not had chest pain syncope or CHF. She has exertional shortness of breath. I do believe that this is related to her aortic stenosis. She tells me that this is not a limiting symptom for her. We discussed the fact that she is 79 years of age. Certainly this is not a contraindication to surgery. On the other hand she may want to think about this further. I made it quite clear to her that this would be very reasonable approach. I can follow her in the [place] office.   Filed Vitals:   11/26/12 1635 11/26/12 1645 11/26/12 2111 11/27/12 0559  BP: 154/77 149/73 148/60 138/66  Pulse:   78 96  Temp:   98.5 F (36.9 C) 98.4 F (36.9 C)  TempSrc:   Oral Oral  Resp: 15 17 18 19   Height:      Weight:    151 lb 7.3 oz (68.7 kg)  SpO2: 98% 96% 99% 100%    Intake/Output Summary (Last 24 hours) at 11/27/12 0728 Last data filed at 11/27/12 0601  Gross per 24 hour  Intake    823 ml  Output      0 ml  Net    823 ml    LABS: Basic Metabolic Panel: No results found for this basename: NA, K, CL, CO2, GLUCOSE, BUN, CREATININE, CALCIUM, MG, PHOS,  in the last 72 hours Liver Function Tests: No results found for this basename: AST, ALT, ALKPHOS, BILITOT, PROT, ALBUMIN,  in the last 72 hours No results found for this basename: LIPASE, AMYLASE,  in the last 72 hours CBC: No results found for this basename: WBC, NEUTROABS,  HGB, HCT, MCV, PLT,  in the last 72 hours Cardiac Enzymes: No results found for this basename: CKTOTAL, CKMB, CKMBINDEX, TROPONINI,  in the last 72 hours BNP: No components found with this basename: POCBNP,  D-Dimer: No results found for this basename: DDIMER,  in the last 72 hours Hemoglobin A1C: No results found for this basename: HGBA1C,  in the last 72 hours Fasting Lipid Panel: No results found for this basename: CHOL, HDL, LDLCALC, TRIG, CHOLHDL, LDLDIRECT,  in the last 72 hours Thyroid Function Tests: No results found for this basename: TSH, T4TOTAL, FREET3, T3FREE, THYROIDAB,  in the last 72 hours  RADIOLOGY: Dg Knee Ap/lat W/sunrise Right  11/03/2012  *RADIOLOGY REPORT*  Clinical Data: Knee pain.  DG KNEE - 3 VIEWS  Comparison: 05/04/2012  Findings: Changes of right knee replacement.  No hardware or bony complicating feature.  There may be a small joint effusion present. No fracture, subluxation or dislocation.  IMPRESSION: Changes of right knee replacement.  Question small joint effusion. No acute bony abnormality.   Original Report Authenticated By: Charlett Nose, M.D.     PHYSICAL EXAM  Patient is oriented to person time and place. Affect is normal. Her husband  is in the room. Lungs are clear. Respiratory effort is nonlabored. Cardiac exam reveals an S1 and S2. There is a crescendo decrescendo murmur consistent with aortic stenosis. The abdomen is soft. There is no peripheral edema.   TELEMETRY:  I have reviewed telemetry today November 27, 2012. There is normal sinus rhythm.   ASSESSMENT AND PLAN:    Aortic stenosis     The patient has severe aortic stenosis. The plan yesterday was form the team to contact the cardiac surgeons. I believe that this has been done in some of the presurgical studies have been ordered. At this point the full consultation is still pending. As the morning goes on today I will look further into final input from cardiac surgery. It may turn out that the  best plan is to have the current assessment in place, but for the patient to go home to follow with me in the office over time before final decision is made.    Dyspnea     The patient has dyspnea on exertion. I feel that this is related to her aortic stenosis.   Willa Rough 11/27/2012 7:28 AM

## 2012-11-27 NOTE — Progress Notes (Signed)
Medications and follow up appts reviewed with patient and spouse. Understanding verbalized.  Condition stable upon discharge.

## 2012-11-27 NOTE — H&P (Signed)
   H & P  The patient was transferred November 23, 2012 [facility] hospital. There are multiple notes in the record since that time. However a full H&P was never entered into this record.  The patient was seen at [facility] for evaluation of shortness of breath with evidence of significant aortic stenosis. Recent echocardiogram done in the patient's primary care office revealed normal left ventricular function. There was significant aortic stenosis. I made the clinical decision to transfer the patient for further evaluation.  Past medical history allergies aspirin, codeine, sulfa, erythromycin, penicillin.  Medications: as an outpatient the patient had been on amlodipine 2.5 mg, Prilosec 20 mg twice a day, lisinopril 40, eyedrops,  Other medical problems:   Hypertension, diabetes diet controlled, glaucoma, GERD, vertigo, anemia.  Social history:   Patient is married. She does not smoke or drink alcohol.  Family history:     There is a family history of coronary disease.  Review of systems:   The patient was stable. She denied fever, chills, headache, sweats, rash, change in vision, change in hearing, chest pain, cough, nausea vomiting, urinary symptoms. All other systems were reviewed and were negative.  Physical exam:   Blood pressure on admission was 150/89. The pulse is 59. Respirations were 18. She was quite stable in no distress. There was no jugular venous distention. Lungs were clear. Respiratory effort was nonlabored. Cardiac exam revealed an S1 and S2. There was a crescendo decrescendo systolic murmur. The abdomen was soft. There was no significant peripheral edema. The patient was quite stable.  Laboratory data:    The patient's troponins have been normal. BUN was 21 with creatinine 1.2. Hemoglobin was 10.8. Chest x-ray revealed mild cardiomegaly. There was no significant pulmonary abnormality. EKG revealed sinus rhythm.  Diagnoses:    Aortic stenosis. This is severe. The  patient is admitted for catheterization and for further valuation of coronary arteries.  Normal systolic left ventricular function  Anemia  History of dizziness.  Jerral Bonito, MD

## 2012-11-27 NOTE — Discharge Summary (Signed)
CARDIOLOGY DISCHARGE SUMMARY   Patient ID: Kelli Stone MRN: [id number] DOB/AGE: 19-May-1931 79 y.o.  Admit date: 11/23/2012 Discharge date: 11/27/2012  Primary Discharge Diagnosis:     Precordial pain - no critical CAD at cath Secondary Discharge Diagnosis:    Aortic stenosis - severe   Dyspnea  Consults: Dr Laneta Simmers   Procedure Performed:  1. Left Heart Catheterization 2. Selective Coronary Angiography 3. Right Heart Catheterization 4. Left ventricular pressures 5. Carotid Dopplers 6. 2-D echocardiogram     Hospital Course: Kelli Stone is a 79 y.o. female with a history of AS, but no history of CAD. She had been having significant dyspnea on exertion and had had chest pain. She went to [facility]. There was concern for unstable anginal pain and severe aortic stenosis. Dr. Myrtis Ser was concerned about these issues, so she was transferred to [facility] for further evaluation and treatment.  Her cardiac enzymes had been negative at [facility]. She was taken to the cath lab on 11/24/2012 with full results below. Because of her aortic stenosis, both a right and left heart catheterization were performed. She had no critical coronary artery disease. To further assess the hemodynamics of her aortic valve, an echocardiogram was also performed. The results are below. The results of the right heart catheterization and echocardiogram were reviewed by Dr. Myrtis Ser and Dr. Clifton James. Dr. Shirlee Latch also reviewed the echo. They were all in agreement that the echocardiogram probably reflects the status of her aortic valve more accurately. A TEE was indicated. This was performed on 11/26/2012. The results are below. Her aortic stenosis is felt to be severe. A surgical consult was called.  The situation was discussed with the patient. Dr. Sharee Pimple conclusions: "I think AVR is indicated. This is not going to get better on its own. She is 18 but active and should do well with surgery. I told her that she has time  to think about it and discuss it with her sons and I will be happy to see her back in the office with her sons to discuss it further."  Kelli Stone was in agreement with this as a plan of care. Carotid Dopplers were performed which showed no significant stenosis. Dr. Myrtis Ser reviewed the data and spoke with the patient. The decision was made for her to be discharged home, to follow up in the [facility] office and with Dr. Laneta Simmers.  Labs:   Lab Results  Component Value Date   WBC 7.4 11/24/2012   HGB 10.5* 11/24/2012   HCT 30.5* 11/24/2012   MCV 76.4* 11/24/2012   PLT 266 11/24/2012     Recent Labs Lab 11/24/12 0450  NA 140  K 3.5  CL 108  CO2 25  BUN 14  CREATININE 1.06  CALCIUM 8.6  GLUCOSE 104*    Cardiac Cath: 11/24/2012 Hemodynamic Findings:  Ao: 207/85  LV: 230/7/11  RA: 6  RV: 35/6/8  PA: 30/11 (mean 18)  PCWP: 9  Fick Cardiac Output: 4.6 L/min  Fick Cardiac Index: 2.66 L/min  Central Aortic Saturation: 96%  Pulmonary Artery Saturation: 61%  Aortic valve data: Peak to peak gradient  Mean gradient 15.5 mmHg  AVA= 1.3 cm2  Angiographic Findings:  Left main: No obstructive disease noted.  Left Anterior Descending Artery: Large caliber vessel that courses to the apex. The mid vessel has mild diffuse 20% stenosis. The diagonal branch is moderate in caliber with ostial 30% stenosis.  Circumflex Artery: Large caliber vessel with 40% mid stenosis. Moderate  caliber OM branch with mild plaque.  Right Coronary Artery: Large dominant vessel with 20% mid stenosis   EKG: 26-Nov-2012 09:56:13  Normal sinus rhythm T wave abnormality, consider lateral ischemia Abnormal ECG Vent. rate 76 BPM PR interval 134 ms QRS duration 82 ms QT/QTc 370/416 ms P-R-T axes 59 29 266  TEE: 11/26/2012 Normal LV size with moderate asymmetric basal septal hypertrophy. EF 60%. There was no LV outflow tract gradient or mitral valve SAM. There was no subaortic membrane. The aortic valve was trileaflet  with severe calcification. I suspect stenosis is severe: The mean gradient I obtained by TEE was 38 mmHg with calculated valve area 0.79 cm^2. Normal RV size and systolic function.   Echo: 11/24/2012 Conclusions  - Left ventricle: The cavity size was normal. Wall thickness was increased in a pattern of moderate LVH. The estimated ejection fraction was 65%. Wall motion was normal; there were no regional wall motion abnormalities. Doppler parameters are consistent with abnormal left ventricular relaxation (grade 1 diastolic dysfunction). - Aortic valve: Heavy calcification. There is one area of opening seen. The doppler data is c/w very severe AS. Mean gradient: 58mm Hg (S). Peak gradient: 98mm Hg (S). Peak velocity above the aortic valve 4.47m/sec. Valve area: 0.71cm^2(VTI). Valve area: 0.65cm^2 (Vmax).   FOLLOW UP PLANS AND APPOINTMENTS Allergies  Allergen Reactions  . Shellfish Allergy Nausea Only and Other (See Comments)    Vomiting & diarrhea  . Bonine (Meclizine Hcl)     Nervous   . Lotrel (Amlodipine Besy-Benazepril Hcl)     Made legs  Ache   . Benadryl (Diphenhydramine Hcl) Other (See Comments)    nervous  . Cephalexin     Pt can't remember   . Other Other (See Comments)    GUIATUSSIN. Unknown reaction  . Sulfa Antibiotics Other (See Comments)    unknown  . Zocor (Simvastatin - High Dose)     Aching   . Celebrex (Celecoxib) Other (See Comments)    Chest discomfort  . Dilacor Xr (Diltiazem Hcl) Other (See Comments)    Didn't dissolve  . Erythromycin Nausea Only  . Lescol Other (See Comments)    aching  . Metformin And Related Diarrhea  . Methylprednisolone Acetate Itching and Other (See Comments)  . Naprosyn (Naproxen) Nausea Only  . Penicillins Other (See Comments)    sleepy  . Stadol (Butorphanol Tartrate) Other (See Comments)    numb  . Trinalin (Azatadine-Pseudoephedrine) Other (See Comments)    nervous     Medication List    TAKE these medications         amLODipine 10 MG tablet  Commonly known as:  NORVASC  Take 1 tablet (10 mg total) by mouth daily.     gabapentin 100 MG capsule  Commonly known as:  NEURONTIN  Take 1 capsule (100 mg total) by mouth 3 (three) times daily.     HYDROcodone-acetaminophen 7.5-325 MG per tablet  Commonly known as:  NORCO  Take 1 tablet by mouth every 4 (four) hours as needed for pain.     latanoprost 0.005 % ophthalmic solution  Commonly known as:  XALATAN  Place 1 drop into both eyes at bedtime.     lisinopril 40 MG tablet  Commonly known as:  PRINIVIL,ZESTRIL  Take 40 mg by mouth daily.     omeprazole 20 MG capsule  Commonly known as:  PRILOSEC  Take 20 mg by mouth daily.     timolol 0.5 % ophthalmic gel-forming  Commonly known as:  TIMOPTIC-XR  Place 1 drop into both eyes daily.        Discharge Orders   Future Appointments Provider Department Dept Phone   12/02/2012 10:45 AM Vickki Hearing, MD [facility] [phone]   12/14/2012 9:00 AM Rollene Rotunda, MD [address] [place] ([place]) [phone]   12/23/2012 9:30 AM Vickki Hearing, MD [facility] [phone]   Future Orders Complete By Expires     Diet - low sodium heart healthy  As directed     Increase activity slowly  As directed       Follow-up Information   Follow up with Willa Rough, MD. (The office will call.)    Contact information:   [address] [place], [address] [place], [place] [phone]      Schedule an appointment as soon as possible for a visit with Alleen Borne, MD.   Contact information:   [address] [place] [address] [phone]       BRING ALL MEDICATIONS WITH YOU TO FOLLOW UP APPOINTMENTS  Time spent with patient to include physician time: 42 min Signed: Theodore Demark, PA-C 11/27/2012, 1:07 PM Co-Sign MD  Patient seen and examined. I agree with the assessment and plan as detailed above. See also  my additional thoughts below.   See my progress note from today. I made decision for pt to go home.  Willa Rough, MD, [facility] 11/27/2012 6:28 PM

## 2012-11-30 ENCOUNTER — Telehealth: Payer: Self-pay | Admitting: Orthopedic Surgery

## 2012-11-30 ENCOUNTER — Telehealth: Payer: Self-pay | Admitting: *Deleted

## 2012-11-30 NOTE — Telephone Encounter (Signed)
Patient called to relay that she has just been discharged from hospital,  [facility] - had been transferred there from [facility]; states was unable to have the epidural injections due to hospitalization and other pending medical issues. Therefore, she will need to cancel upcoming appointment.  (Done)  Patient relays also that she will be having open heart surgery.  Asked if Dr. Romeo Apple can call her, as she wanted to talk with him.  Ph#[phone] (Home).

## 2012-11-30 NOTE — Telephone Encounter (Signed)
Confirmed appointment date and time for Friday, 12/04/2012 with Dr. Myrtis Ser.  Advised patient to bring all medications and insurance cards with her to appointment.  Patient has all meds and verbalized understanding of how she is to be taking them.  States she does have hard area at cath site, but no pain, fever, or redness noted.  Dr. Myrtis Ser aware & he will check site on Friday.  Advised to call back if symptoms worsen before then.

## 2012-11-30 NOTE — Telephone Encounter (Signed)
Message copied by Lesle Chris on Mon Nov 30, 2012  4:52 PM ------      Message from: Burnice Logan      Created: Mon Nov 30, 2012  3:36 PM       TCM Pt. [place] called and needs you to call and follow up w/ the TCM question TODAY b/c she went home on Firday and she has an appt w/ Dr. Myrtis Ser in our office on 3-21 ------

## 2012-12-02 ENCOUNTER — Ambulatory Visit: Payer: Medicare Other | Admitting: Orthopedic Surgery

## 2012-12-03 ENCOUNTER — Encounter: Payer: Self-pay | Admitting: Cardiology

## 2012-12-03 DIAGNOSIS — IMO0002 Reserved for concepts with insufficient information to code with codable children: Secondary | ICD-10-CM | POA: Insufficient documentation

## 2012-12-03 DIAGNOSIS — I779 Disorder of arteries and arterioles, unspecified: Secondary | ICD-10-CM | POA: Insufficient documentation

## 2012-12-03 DIAGNOSIS — I2510 Atherosclerotic heart disease of native coronary artery without angina pectoris: Secondary | ICD-10-CM | POA: Insufficient documentation

## 2012-12-03 DIAGNOSIS — R0600 Dyspnea, unspecified: Secondary | ICD-10-CM | POA: Insufficient documentation

## 2012-12-03 DIAGNOSIS — I739 Peripheral vascular disease, unspecified: Secondary | ICD-10-CM

## 2012-12-03 DIAGNOSIS — E785 Hyperlipidemia, unspecified: Secondary | ICD-10-CM | POA: Insufficient documentation

## 2012-12-03 DIAGNOSIS — R9430 Abnormal result of cardiovascular function study, unspecified: Secondary | ICD-10-CM | POA: Insufficient documentation

## 2012-12-03 DIAGNOSIS — I350 Nonrheumatic aortic (valve) stenosis: Secondary | ICD-10-CM | POA: Insufficient documentation

## 2012-12-04 ENCOUNTER — Ambulatory Visit (INDEPENDENT_AMBULATORY_CARE_PROVIDER_SITE_OTHER): Payer: Medicare Other | Admitting: Cardiology

## 2012-12-04 ENCOUNTER — Encounter: Payer: Self-pay | Admitting: Cardiology

## 2012-12-04 VITALS — BP 144/74 | HR 68 | Ht 63.0 in | Wt 156.1 lb

## 2012-12-04 DIAGNOSIS — D649 Anemia, unspecified: Secondary | ICD-10-CM

## 2012-12-04 DIAGNOSIS — I359 Nonrheumatic aortic valve disorder, unspecified: Secondary | ICD-10-CM

## 2012-12-04 DIAGNOSIS — I350 Nonrheumatic aortic (valve) stenosis: Secondary | ICD-10-CM

## 2012-12-04 DIAGNOSIS — I2510 Atherosclerotic heart disease of native coronary artery without angina pectoris: Secondary | ICD-10-CM

## 2012-12-04 DIAGNOSIS — I10 Essential (primary) hypertension: Secondary | ICD-10-CM

## 2012-12-04 NOTE — Assessment & Plan Note (Signed)
Blood pressure is well-controlled today. I do not feel that adjusting her blood pressure medicines would help with her current symptoms.

## 2012-12-04 NOTE — Assessment & Plan Note (Signed)
Historically the patient had been hospitalized in the last several months for some colitis and rectal bleeding. This issue appears to be resolved.

## 2012-12-04 NOTE — Patient Instructions (Addendum)
Your physician recommends that you schedule a follow-up appointment in: 8-9 weeks. Your physician recommends that you continue on your current medications as directed. Please refer to the Current Medication list given to you today.  Dr. Sharee Pimple office was called today and message left with his nurse about getting your appointment. [phone]. Ryan at [place] office will contact patient directly to set up appointment.

## 2012-12-04 NOTE — Assessment & Plan Note (Signed)
Catheterization March, 2014 revealed 30 and 40% lesions. No further treatment was recommended.

## 2012-12-04 NOTE — Progress Notes (Signed)
HPI  The patient is seen today post hospitalization for the followup of her aortic stenosis. She has been having exertional shortness of breath. She's had some vague chest tightness. She did have some mild dizziness. She has not had true syncope. She has not had definite CHF. I recently saw her at [facility] and I was quite concerned about her aortic stenosis. She had a recent echo in her primary care doctor's office showing severe aortic stenosis. She was sent to [facility] for cardiac catheterization. While there she did have a followup 2-D echo which again showed very severe aortic stenosis. She has normal left ventricular systolic function. Cardiac catheterization showed only mild coronary disease. The hemodynamics in the cath lab were confusing. They did not seem to show a significant gradient. I am not convinced that this data was completely accurate.  To complete her evaluation in the hospital she had a transesophageal echo. This study also shows severe aortic stenosis. She was also seen in consultation by Dr. Laneta Simmers who agreed that her aortic stenosis is severe. She was allowed to go home with plans to see me back in the office for followup.  The patient has resumed her activities at home. She does note that even with helping her husband make the bed today that she has some shortness of breath.   The patient has also been concerned about a small Lump that she feels in her right groin at the cath site. It is been mildly tender to palpation.  Allergies  Allergen Reactions  . Shellfish Allergy Nausea Only and Other (See Comments)    Vomiting & diarrhea  . Bonine (Meclizine Hcl)     Nervous   . Lotrel (Amlodipine Besy-Benazepril Hcl)     Made legs  Ache   . Benadryl (Diphenhydramine Hcl) Other (See Comments)    nervous  . Cephalexin     Pt can't remember   . Other Other (See Comments)    GUIATUSSIN. Unknown reaction  . Sulfa Antibiotics Other (See Comments)    unknown  .  Zocor (Simvastatin - High Dose)     Aching   . Celebrex (Celecoxib) Other (See Comments)    Chest discomfort  . Dilacor Xr (Diltiazem Hcl) Other (See Comments)    Didn't dissolve  . Erythromycin Nausea Only  . Lescol Other (See Comments)    aching  . Metformin And Related Diarrhea  . Methylprednisolone Acetate Itching and Other (See Comments)  . Naprosyn (Naproxen) Nausea Only  . Penicillins Other (See Comments)    sleepy  . Stadol (Butorphanol Tartrate) Other (See Comments)    numb  . Trinalin (Azatadine-Pseudoephedrine) Other (See Comments)    nervous    Current Outpatient Prescriptions  Medication Sig Dispense Refill  . amLODipine (NORVASC) 10 MG tablet Take 1 tablet (10 mg total) by mouth daily.  30 tablet  11  . gabapentin (NEURONTIN) 100 MG capsule Take 1 capsule (100 mg total) by mouth 3 (three) times daily.  90 capsule  2  . HYDROcodone-acetaminophen (NORCO) 7.5-325 MG per tablet Take 1 tablet by mouth every 4 (four) hours as needed for pain.  60 tablet  5  . latanoprost (XALATAN) 0.005 % ophthalmic solution Place 1 drop into both eyes at bedtime.        Marland Kitchen lisinopril (PRINIVIL,ZESTRIL) 40 MG tablet Take 40 mg by mouth daily.        . pantoprazole (PROTONIX) 40 MG tablet Take 40 mg by mouth daily.      Marland Kitchen  timolol (TIMOPTIC-XR) 0.5 % ophthalmic gel-forming Place 1 drop into both eyes daily.        No current facility-administered medications for this visit.    History   Social History  . Marital Status: Married    Spouse Name: N/A    Number of Children: N/A  . Years of Education: 11th grade   Occupational History  . Not on file.   Social History Main Topics  . Smoking status: Former Smoker -- 0.20 packs/day for 15 years    Types: Cigarettes    Quit date: 09/17/1983  . Smokeless tobacco: Never Used  . Alcohol Use: No  . Drug Use: No  . Sexually Active: No   Other Topics Concern  . Not on file   Social History Narrative  . No narrative on file    Family  History  Problem Relation Age of Onset  . Cancer    . Diabetes    . Diabetes Mother   . Bone cancer Father   . Diabetes Sister   . Kidney disease Sister   . Stomach cancer Sister   . Stroke Brother   . Bone cancer Brother   . Healthy Son     Past Medical History  Diagnosis Date  . Glaucoma(365)   . GERD (gastroesophageal reflux disease)   . Personal history of tobacco use, presenting hazards to health   . Personal history of allergy to penicillin   . Personal history of allergy to sulfonamides   . Vertigo     HISTORY OF  . Diabetes mellitus     diet controlled  . HTN (hypertension)   . Dyslipidemia   . Aortic stenosis     Severe, echo, TEE, cardiac catheterizationn, surgical consultation, Dr Laneta Simmers, March, 2014  . Carotid artery disease     Doppler, March, 2014, no significant stenoses.  Marland Kitchen CAD (coronary artery disease)     Mild, catheterization, March, 2014, 30 and 40% lesions, nonobstructive, no further evaluation  . Ejection fraction     EF 60%, echo, March, 2014,  . Dyspnea     Exertional dyspnea March, 2014, felt secondary to aortic stenosis    Past Surgical History  Procedure Laterality Date  . Appendectomy    . Right shoulder    . Shoulder surgery    . Breast surgery  bilateral  . Vaginal hysterectomy    . Total knee arthroplasty  07/01/2011    Procedure: TOTAL KNEE ARTHROPLASTY;  Surgeon: Fuller Canada, MD;  Location: [facility] ORS;  Service: Orthopedics;  Laterality: Right;  Depuy  . Colonoscopy  05/07/2012    Procedure: COLONOSCOPY;  Surgeon: Malissa Hippo, MD;  Location: [facility] ENDO SUITE;  Service: Endoscopy;  Laterality: N/A;  200  . Tee without cardioversion N/A 11/26/2012    Procedure: TRANSESOPHAGEAL ECHOCARDIOGRAM (TEE);  Surgeon: Laurey Morale, MD;  Location: [facility] ENDOSCOPY;  Service: Cardiovascular;  Laterality: N/A;    Patient Active Problem List  Diagnosis  . HTN (hypertension)  . OA (osteoarthritis) of knee  . Arthritis of knee, right  .  Constipated  . S/P total knee replacement  . Difficulty in walking  . Bursitis of shoulder, right  . DVT (deep venous thrombosis)  . Neuropathy of leg  . Spinal stenosis  . Anemia  . Patellar tendonitis  . Diverticulitis of sigmoid colon  . Dyslipidemia  . Aortic stenosis  . Carotid artery disease  . CAD (coronary artery disease)  . Dyspnea  . Ejection fraction  ROS   Patient denies fever, chills, headache, sweats, rash, change in vision, change in hearing, cough, nausea vomiting, urinary symptoms. All other systems are reviewed and are negative.  PHYSICAL EXAM    Patient is here with her husband. She is oriented to person time and place. Affect is normal. Her carotid upstroke is reduced. There is a murmur of aortic stenosis that radiates to her neck bilaterally. Lungs are clear. Respiratory effort is nonlabored. Cardiac exam reveals S1 and S2. There is a high-pitched crescendo decrescendo systolic murmur compatible with aortic stenosis. The abdomen is soft. There is no peripheral edema.  Filed Vitals:   12/04/12 1410  BP: 144/74  Pulse: 68  Height: 5\' 3"  (1.6 m)  Weight: 156 lb 1.9 oz (70.816 kg)     ASSESSMENT & PLAN

## 2012-12-04 NOTE — Assessment & Plan Note (Addendum)
The patient has severe symptomatic aortic stenosis. I've had a very long and careful discussion with her and her husband. I made it clear that there is no absolute urgency for aortic valve surgery. However I do believe that it is medically indicated. I believe that her symptoms are from aortic stenosis. I feel that she is a good candidate for surgery. I feel that she will do well and be more active afterwards. I explained all of these issues to her.  We are arranging an appointment for the patient to discuss further the possibility of surgery with Dr. Laneta Simmers. After she discusses it with him further she and her husband will make decisions. I made it clear to her that I will be following her over time regardless of her decision about surgery at this point.  As part of today's evaluation I spent greater than 25 minutes with her and her husband. I actually spent a great deal longer. However more than half of the 25 minutes was spent with careful discussion directly with the patient and her husband.

## 2012-12-07 ENCOUNTER — Encounter: Payer: Self-pay | Admitting: Surgery

## 2012-12-07 ENCOUNTER — Encounter: Payer: Medicare Other | Admitting: Cardiology

## 2012-12-07 ENCOUNTER — Other Ambulatory Visit: Payer: Self-pay | Admitting: *Deleted

## 2012-12-07 ENCOUNTER — Ambulatory Visit (INDEPENDENT_AMBULATORY_CARE_PROVIDER_SITE_OTHER): Payer: Medicare Other | Admitting: Surgery

## 2012-12-07 VITALS — BP 145/69 | HR 78 | Resp 16 | Ht 63.0 in | Wt 156.0 lb

## 2012-12-07 DIAGNOSIS — I350 Nonrheumatic aortic (valve) stenosis: Secondary | ICD-10-CM

## 2012-12-07 DIAGNOSIS — I359 Nonrheumatic aortic valve disorder, unspecified: Secondary | ICD-10-CM

## 2012-12-08 ENCOUNTER — Encounter (HOSPITAL_COMMUNITY): Payer: Self-pay | Admitting: Pharmacy Technician

## 2012-12-08 ENCOUNTER — Encounter: Payer: Self-pay | Admitting: Surgery

## 2012-12-08 NOTE — Progress Notes (Signed)
[address]       [place] [address]             [phone]      HPI:  79 year old fairly active woman with history of moderate aortic stenosis by 2D echo in 01/2011 with velocity of 351 m/sec and mean gradient of 25. She recently presented to [facility] with dizziness that was occuring in the shower and while sitting at the table eating. She says that in the shower she just had to lean up in the corner of the shower so she wouldn't fall. When she was sitting at the table she got so dizzy that her hands started shaking. She has also had some exertional shortness of breath while working around the house. She also reports some feeling of fullness in the chest with activity that passes with rest. A 2D echo on 11/24/12 showed an aortic valve velocity of 4.9 m/sec with a mean gradient of 58 mm Hg and a valve area of 0.65 cm2. Cardiac cath mean gradient was only 15.5 with AVA = 1.3 with CI = 2.66 with normal right and left heart pressures. There is no significant coronary stenoses. LV function is normal with no significant gradient across the LVOT. TEE was done and shows calcified valve leaflets with poor mobility. The mean gradient was 38mm Hg with AVA 0.79 cm2. I saw her in the hospital in consultation. The decision was made by cardiology to send her home with close followup. She said that since going home she has continued to have exertional dyspnea with activities around the house. She has also had some substernal chest pressure and she's not sure whether that is due to her heart or reflux. She's had no dizziness or syncope.   Current Outpatient Prescriptions  Medication Sig Dispense Refill  . amLODipine (NORVASC) 10 MG tablet Take 1 tablet (10 mg total) by mouth daily.  30 tablet  11  . gabapentin (NEURONTIN) 100 MG capsule Take 1 capsule (100 mg total) by mouth 3 (three) times daily.  90 capsule  2  . HYDROcodone-acetaminophen (NORCO) 7.5-325 MG per tablet Take 1 tablet  by mouth every 4 (four) hours as needed for pain.  60 tablet  5  . latanoprost (XALATAN) 0.005 % ophthalmic solution Place 1 drop into both eyes at bedtime.        Marland Kitchen lisinopril (PRINIVIL,ZESTRIL) 40 MG tablet Take 40 mg by mouth daily.       . pantoprazole (PROTONIX) 40 MG tablet Take 40 mg by mouth daily.      . timolol (TIMOPTIC-XR) 0.5 % ophthalmic gel-forming Place 1 drop into both eyes daily.       Marland Kitchen lactase (LACTAID) 3000 UNITS tablet Take 1 tablet by mouth 3 (three) times daily with meals.       No current facility-administered medications for this visit.     Physical Exam:  BP 145/69  Pulse 78  Resp 16  Ht 5\' 3"  (1.6 m)  Wt 156 lb (70.761 kg)  BMI 27.64 kg/m2  SpO2 99% She looks well. Cardiac exam shows a regular rate and rhythm with a grade 3/6 harsh systolic murmur over the aorta. Lung exam is clear. There is no peripheral edema.  Diagnostic Tests:  Procedure: TEE  Indication: Aortic stenosis  Sedation: Versed 3 mg IV, Fentanyl 25 mcg IV  Findings: Please see echo section for full report.  Normal LV size with moderate asymmetric basal septal hypertrophy.  EF  60%.  There was no LV outflow tract gradient or mitral valve SAM.  There was no subaortic membrane.  The aortic valve was trileaflet with severe calcification.  I suspect stenosis is severe: The mean gradient I obtained by TEE was 38 mmHg with calculated valve area 0.79 cm^2.  Normal RV size and systolic function.   [name] 11/26/2012 4:14 PM         Cardiac Cath:  Hemodynamic Findings: Ao:  207/85              LV: 230/7/11 RA:  6              RV:  35/6/8 PA:  30/11 (mean 18)      PCWP:  9 Fick Cardiac Output: 4.6 L/min Fick Cardiac Index: 2.66 L/min Central Aortic Saturation: 96% Pulmonary Artery Saturation: 61%  Aortic valve data: Peak to peak gradient                             Mean gradient 15.5 mmHg                             AVA= 1.3 cm2   Angiographic Findings:  Left  main: No obstructive disease noted.   Left Anterior Descending Artery: Large caliber vessel that courses to the apex. The mid vessel has mild diffuse 20% stenosis. The diagonal branch is moderate in caliber with ostial 30% stenosis.    Circumflex Artery: Large caliber vessel with 40% mid stenosis. Moderate caliber OM branch with mild plaque.    Right Coronary Artery: Large dominant vessel with 20% mid stenosis.   Left Ventricular Angiogram: Deferred.   Impression: 1. Mild non-obstructive CAD 2. Moderately severe aortic stenosis  Recommendations: She has mild CAD. Her aortic stenosis appears to be moderately severe. Will arrange an echo today. Further planning pending echo.            Impression/Plan:  She has severe symptomatic aortic stenosis which will require aortic valve replacement. I discussed all the studies with the patient and her family including her son by telephone conference. They questioned if she was candidate for TAVR but I don't think she is. I put her risk factors into the STS risk factor calculator and her risk of mortality is only between 3 and 4%. I would plan to use a tissue valve given her age. I discussed the operative procedure with the patient and family including alternatives, benefits and risks; including but not limited to bleeding, blood transfusion, infection, stroke, myocardial infarction, graft failure, heart block requiring a permanent pacemaker, organ dysfunction, and death.  Pura Spice Tzeng understands and agrees to proceed.  We will schedule surgery for Tuesday 12/22/12.

## 2012-12-14 ENCOUNTER — Ambulatory Visit: Payer: Medicare Other | Admitting: Cardiology

## 2012-12-18 ENCOUNTER — Ambulatory Visit (HOSPITAL_COMMUNITY)
Admission: RE | Admit: 2012-12-18 | Discharge: 2012-12-18 | Disposition: A | Discharge status: Home / Self Care | Payer: Medicare Other | Source: Ambulatory Visit | Attending: Surgery | Admitting: Surgery

## 2012-12-18 ENCOUNTER — Encounter (HOSPITAL_COMMUNITY): Payer: Self-pay

## 2012-12-18 ENCOUNTER — Encounter (HOSPITAL_COMMUNITY)
Admission: RE | Admit: 2012-12-18 | Discharge: 2012-12-18 | Disposition: A | Discharge status: Home / Self Care | Payer: Medicare Other | Source: Ambulatory Visit | Attending: Surgery | Admitting: Surgery

## 2012-12-18 VITALS — BP 129/57 | HR 66 | Temp 98.8°F | Resp 20 | Ht 63.0 in | Wt 159.1 lb

## 2012-12-18 DIAGNOSIS — I10 Essential (primary) hypertension: Secondary | ICD-10-CM | POA: Insufficient documentation

## 2012-12-18 DIAGNOSIS — K5730 Diverticulosis of large intestine without perforation or abscess without bleeding: Secondary | ICD-10-CM | POA: Insufficient documentation

## 2012-12-18 DIAGNOSIS — I359 Nonrheumatic aortic valve disorder, unspecified: Secondary | ICD-10-CM

## 2012-12-18 DIAGNOSIS — H409 Unspecified glaucoma: Secondary | ICD-10-CM | POA: Insufficient documentation

## 2012-12-18 DIAGNOSIS — Z01812 Encounter for preprocedural laboratory examination: Secondary | ICD-10-CM | POA: Insufficient documentation

## 2012-12-18 DIAGNOSIS — K219 Gastro-esophageal reflux disease without esophagitis: Secondary | ICD-10-CM | POA: Insufficient documentation

## 2012-12-18 DIAGNOSIS — I2510 Atherosclerotic heart disease of native coronary artery without angina pectoris: Secondary | ICD-10-CM | POA: Insufficient documentation

## 2012-12-18 DIAGNOSIS — E119 Type 2 diabetes mellitus without complications: Secondary | ICD-10-CM | POA: Insufficient documentation

## 2012-12-18 DIAGNOSIS — Z96659 Presence of unspecified artificial knee joint: Secondary | ICD-10-CM | POA: Insufficient documentation

## 2012-12-18 DIAGNOSIS — E785 Hyperlipidemia, unspecified: Secondary | ICD-10-CM | POA: Insufficient documentation

## 2012-12-18 DIAGNOSIS — R42 Dizziness and giddiness: Secondary | ICD-10-CM | POA: Insufficient documentation

## 2012-12-18 DIAGNOSIS — I6529 Occlusion and stenosis of unspecified carotid artery: Secondary | ICD-10-CM | POA: Insufficient documentation

## 2012-12-18 DIAGNOSIS — M129 Arthropathy, unspecified: Secondary | ICD-10-CM | POA: Insufficient documentation

## 2012-12-18 DIAGNOSIS — Z87891 Personal history of nicotine dependence: Secondary | ICD-10-CM | POA: Insufficient documentation

## 2012-12-18 DIAGNOSIS — Z01818 Encounter for other preprocedural examination: Secondary | ICD-10-CM | POA: Insufficient documentation

## 2012-12-18 HISTORY — DX: Unspecified osteoarthritis, unspecified site: M19.90

## 2012-12-18 LAB — URINALYSIS, ROUTINE W REFLEX MICROSCOPIC
Glucose, UA: NEGATIVE mg/dL
Hgb urine dipstick: NEGATIVE
Leukocytes, UA: NEGATIVE
Protein, ur: NEGATIVE mg/dL
Specific Gravity, Urine: 1.013 (ref 1.005–1.030)
Urobilinogen, UA: 0.2 mg/dL (ref 0.0–1.0)

## 2012-12-18 LAB — CBC
Hemoglobin: 10.2 g/dL — ABNORMAL LOW (ref 12.0–15.0)
MCH: 26.4 pg (ref 26.0–34.0)
MCHC: 35.3 g/dL (ref 30.0–36.0)
MCV: 74.9 fL — ABNORMAL LOW (ref 78.0–100.0)

## 2012-12-18 LAB — COMPREHENSIVE METABOLIC PANEL
ALT: 13 U/L (ref 0–35)
Calcium: 9.6 mg/dL (ref 8.4–10.5)
Creatinine, Ser: 1.01 mg/dL (ref 0.50–1.10)
GFR calc Af Amer: 60 mL/min — ABNORMAL LOW (ref >90)
Glucose, Bld: 91 mg/dL (ref 70–99)
Sodium: 138 mEq/L (ref 135–145)
Total Protein: 7 g/dL (ref 6.0–8.3)

## 2012-12-18 LAB — PROTIME-INR
INR: 1.03 (ref 0.00–1.49)
Prothrombin Time: 13.4 seconds (ref 11.6–15.2)

## 2012-12-18 LAB — BLOOD GAS, ARTERIAL
Bicarbonate: 22.3 mEq/L (ref 20.0–24.0)
Drawn by: 206361
FIO2: 0.21 %
O2 Saturation: 95.9 %
Patient temperature: 98.6

## 2012-12-18 LAB — SURGICAL PCR SCREEN
MRSA, PCR: NEGATIVE
Staphylococcus aureus: NEGATIVE

## 2012-12-18 LAB — HEMOGLOBIN A1C
Hgb A1c MFr Bld: 6.4 % — ABNORMAL HIGH (ref <5.7)
Mean Plasma Glucose: 137 mg/dL — ABNORMAL HIGH (ref <117)

## 2012-12-18 LAB — ABO/RH: ABO/RH(D): O NEG

## 2012-12-18 NOTE — Progress Notes (Signed)
Cardiologist is Dr. Myrtis Ser. PCP is Dr. Sherril Croon in [place], [place]. Darius Bump, RN paged for cardiac teaching.

## 2012-12-18 NOTE — Pre-Procedure Instructions (Signed)
Kelli Stone  12/18/2012   Your procedure is scheduled on:  Friday December 25, 2012.  Report to [facility] 3rd floor at 5:30 AM.  Call this number if you have problems the morning of surgery: [phone]   Remember:   Do not eat food or drink liquids after midnight.   Take these medicines the morning of surgery with A SIP OF WATER: Amlodipine (Norvac), Gabapentin (Neurontin), Hydrocodone if needed for pain, Pantoprazole (Protonix)   Do not wear jewelry, make-up or nail polish.  Do not wear lotions, powders, or perfumes.   Do not shave 48 hours prior to surgery.   Do not bring valuables to the hospital.  Contacts, dentures or bridgework may not be worn into surgery.  Leave suitcase in the car. After surgery it may be brought to your room.  For patients admitted to the hospital, checkout time is 11:00 AM the day of  discharge.   Patients discharged the day of surgery will not be allowed to drive  home.  Name and phone number of your driver: Family/Friend  Special Instructions: Shower using CHG 2 nights before surgery and the night before surgery.  If you shower the day of surgery use CHG.  Use special wash - you have one bottle of CHG for all showers.  You should use approximately 1/3 of the bottle for each shower.   Please read over the following fact sheets that you were given: Pain Booklet, Coughing and Deep Breathing, Blood Transfusion Information, Open Heart Packet, MRSA Information and Surgical Site Infection Prevention

## 2012-12-21 NOTE — Progress Notes (Signed)
Anesthesia Chart Review:  Patient is a 79 year old female scheduled for AVR for severe symptomatic AS on 12/25/12 by Dr. Laneta Simmers.  Other history includes mild non-obstructive CAD, former smoker, GERD, glaucoma, vertigo, diet controlled DM2, HTN, dyslipidemia, carotid artery disease, arthritis, TKA, hysterectomy, diverticulitis. PCP is Dr. Sherril Croon in [place].  Cardiologist is Dr. Myrtis Ser.  EKG on 11/28/12 showed NSR, T wave abnormality consider lateral ischemia.  Echo on 11/26/12 showed: - Left ventricle: The cavity size was normal. Systolic function was normal. The estimated ejection fraction was in the range of 60% to 65%. There was moderate asymmetric basal septal hypertrophy. No LV outflow tract gradient. Wall motion was normal; there were no regional wall motion abnormalities. - Aortic valve: Trileaflet; severely calcified leaflets. There was severe stenosis. Mean gradient: 39mm Hg (S). Peak gradient: 60 mm Hg (S). Valve area: 0.7cm^2(VTI). Valve area: 0.7cm^2 (Vmax). - Aorta: Normal caliber aorta with mild plaque. - Mitral valve: Trivial regurgitation. - Left atrium: The atrium was mildly dilated. No evidence of thrombus in the atrial cavity or appendage. - Right ventricle: The cavity size was normal. Systolic function was normal. - Right atrium: No evidence of thrombus in the atrial cavity or appendage. - Atrial septum: No defect or patent foramen ovale was identified. Echo contrast study showed no right-to-left atrial level shunt, at baseline or with provocation.  Cardiac cath on 11/24/12 showed mild nonobstructive CAD (see 20% mid LAD, ostial 30% diagonal, 40% mid circumflex, small plaque OM branch, 20% mid RCA), moderately severe AS.  There was no significant ICA stenosis by carotid duplex 11/26/12.  CXR on 12/18/12 showed no acute cardiopulmonary findings.  PFTs on 11/26/12 showed a FEV1 of 1.29 (86%).  Preoperative labs noted. H/H 10.2/28.9 (previously hgb 10-11.5 since 09/2011).  Here H/H appear stable  over the past year.  She was seen by GI Dr. Lionel December in 2013 and underwent a colonoscopy on 05/07/12 that revealed a moderate number of sigmoid diverticula  He was not able to pass the scope beyond the distal transverse colon.  Mucosa of splenic flexure, descending and sigmoid colon were normal.  Stool guaiac was negative in November 2013.  Labs are in Epic for Dr. Laneta Simmers to review.  If no reported obvious blood loss and otherwise no significant changes in her status then would anticipate she could proceed as planned.  Velna Ochs [facility]/Anesthesiology Phone [phone] 12/21/2012 11:38 AM

## 2012-12-23 ENCOUNTER — Ambulatory Visit: Payer: Medicare Other | Admitting: Orthopedic Surgery

## 2012-12-24 MED ORDER — MAGNESIUM SULFATE 50 % IJ SOLN
40.0000 meq | INTRAMUSCULAR | Status: DC
  Filled 2012-12-24 (×2): qty 10

## 2012-12-24 MED ORDER — METOPROLOL TARTRATE 12.5 MG HALF TABLET
12.5000 mg | ORAL_TABLET | Freq: Once | ORAL | Status: AC
  Administered 2012-12-25: 12.5 mg via ORAL
  Filled 2012-12-24: qty 1

## 2012-12-24 MED ORDER — DEXMEDETOMIDINE HCL IN NACL 400 MCG/100ML IV SOLN
0.1000 ug/kg/h | INTRAVENOUS | Status: AC
  Administered 2012-12-25: 0.2 ug/kg/h via INTRAVENOUS
  Filled 2012-12-24 (×2): qty 100

## 2012-12-24 MED ORDER — EPINEPHRINE HCL 1 MG/ML IJ SOLN
0.5000 ug/min | INTRAVENOUS | Status: DC
  Filled 2012-12-24 (×2): qty 4

## 2012-12-24 MED ORDER — SODIUM CHLORIDE 0.9 % IV SOLN
INTRAVENOUS | Status: AC
  Administered 2012-12-25: 70 mL/h via INTRAVENOUS
  Filled 2012-12-24 (×2): qty 40

## 2012-12-24 MED ORDER — POTASSIUM CHLORIDE 2 MEQ/ML IV SOLN
80.0000 meq | INTRAVENOUS | Status: DC
  Filled 2012-12-24 (×2): qty 40

## 2012-12-24 MED ORDER — PHENYLEPHRINE HCL 10 MG/ML IJ SOLN
30.0000 ug/min | INTRAVENOUS | Status: AC
  Administered 2012-12-25: 10 ug/min via INTRAVENOUS
  Administered 2012-12-25: 5 ug/min via INTRAVENOUS
  Administered 2012-12-25: 15 ug/min via INTRAVENOUS
  Filled 2012-12-24 (×2): qty 2

## 2012-12-24 MED ORDER — INSULIN REGULAR HUMAN 100 UNIT/ML IJ SOLN
INTRAMUSCULAR | Status: AC
  Administered 2012-12-25: 2.4 [IU]/h via INTRAVENOUS
  Filled 2012-12-24 (×2): qty 1

## 2012-12-24 MED ORDER — VANCOMYCIN HCL 10 G IV SOLR
1250.0000 mg | INTRAVENOUS | Status: AC
  Administered 2012-12-25: 1250 mg via INTRAVENOUS
  Filled 2012-12-24: qty 1250

## 2012-12-24 MED ORDER — NITROGLYCERIN IN D5W 200-5 MCG/ML-% IV SOLN
2.0000 ug/min | INTRAVENOUS | Status: AC
  Administered 2012-12-25: 16 ug/min via INTRAVENOUS
  Filled 2012-12-24 (×2): qty 250

## 2012-12-24 MED ORDER — DOPAMINE-DEXTROSE 3.2-5 MG/ML-% IV SOLN
2.0000 ug/kg/min | INTRAVENOUS | Status: DC
  Filled 2012-12-24: qty 250

## 2012-12-24 MED ORDER — CHLORHEXIDINE GLUCONATE 4 % EX LIQD: 30.0000 mL | CUTANEOUS | Status: DC

## 2012-12-24 MED ORDER — LEVOFLOXACIN IN D5W 500 MG/100ML IV SOLN
500.0000 mg | INTRAVENOUS | Status: AC
  Administered 2012-12-25: 500 mg via INTRAVENOUS
  Filled 2012-12-24: qty 100

## 2012-12-24 MED ORDER — PLASMA-LYTE 148 IV SOLN
INTRAVENOUS | Status: AC
  Administered 2012-12-25: 10:00:00
  Filled 2012-12-24 (×2): qty 2.5

## 2012-12-25 ENCOUNTER — Encounter (HOSPITAL_COMMUNITY): Payer: Self-pay | Admitting: *Deleted

## 2012-12-25 ENCOUNTER — Encounter (HOSPITAL_COMMUNITY)
Admission: RE | Disposition: A | Discharge status: Home / Self Care | Payer: Self-pay | Source: Ambulatory Visit | Attending: Surgery

## 2012-12-25 ENCOUNTER — Inpatient Hospital Stay (HOSPITAL_COMMUNITY): Payer: Medicare Other | Admitting: Certified Registered"

## 2012-12-25 ENCOUNTER — Encounter (HOSPITAL_COMMUNITY): Payer: Self-pay | Admitting: Vascular Surgery

## 2012-12-25 ENCOUNTER — Inpatient Hospital Stay (HOSPITAL_COMMUNITY): Payer: Medicare Other

## 2012-12-25 ENCOUNTER — Inpatient Hospital Stay (HOSPITAL_COMMUNITY)
Admission: RE | Admit: 2012-12-25 | Discharge: 2012-12-31 | DRG: 220 | Disposition: A | Discharge status: Home / Self Care | Payer: Medicare Other | Source: Ambulatory Visit | Attending: Surgery | Admitting: Surgery

## 2012-12-25 DIAGNOSIS — K219 Gastro-esophageal reflux disease without esophagitis: Secondary | ICD-10-CM | POA: Diagnosis present

## 2012-12-25 DIAGNOSIS — H409 Unspecified glaucoma: Secondary | ICD-10-CM | POA: Diagnosis present

## 2012-12-25 DIAGNOSIS — Z96659 Presence of unspecified artificial knee joint: Secondary | ICD-10-CM

## 2012-12-25 DIAGNOSIS — E119 Type 2 diabetes mellitus without complications: Secondary | ICD-10-CM | POA: Diagnosis present

## 2012-12-25 DIAGNOSIS — J9819 Other pulmonary collapse: Secondary | ICD-10-CM | POA: Diagnosis not present

## 2012-12-25 DIAGNOSIS — G8929 Other chronic pain: Secondary | ICD-10-CM | POA: Diagnosis present

## 2012-12-25 DIAGNOSIS — I2510 Atherosclerotic heart disease of native coronary artery without angina pectoris: Secondary | ICD-10-CM | POA: Diagnosis present

## 2012-12-25 DIAGNOSIS — Y831 Surgical operation with implant of artificial internal device as the cause of abnormal reaction of the patient, or of later complication, without mention of misadventure at the time of the procedure: Secondary | ICD-10-CM | POA: Diagnosis not present

## 2012-12-25 DIAGNOSIS — I359 Nonrheumatic aortic valve disorder, unspecified: Principal | ICD-10-CM | POA: Diagnosis present

## 2012-12-25 DIAGNOSIS — I4891 Unspecified atrial fibrillation: Secondary | ICD-10-CM | POA: Diagnosis not present

## 2012-12-25 DIAGNOSIS — Z87891 Personal history of nicotine dependence: Secondary | ICD-10-CM

## 2012-12-25 DIAGNOSIS — D62 Acute posthemorrhagic anemia: Secondary | ICD-10-CM | POA: Diagnosis not present

## 2012-12-25 DIAGNOSIS — I519 Heart disease, unspecified: Secondary | ICD-10-CM | POA: Diagnosis not present

## 2012-12-25 DIAGNOSIS — I10 Essential (primary) hypertension: Secondary | ICD-10-CM | POA: Diagnosis present

## 2012-12-25 DIAGNOSIS — Y9210 Unspecified residential institution as the place of occurrence of the external cause: Secondary | ICD-10-CM | POA: Diagnosis not present

## 2012-12-25 DIAGNOSIS — Z952 Presence of prosthetic heart valve: Secondary | ICD-10-CM

## 2012-12-25 DIAGNOSIS — Z86718 Personal history of other venous thrombosis and embolism: Secondary | ICD-10-CM

## 2012-12-25 DIAGNOSIS — M129 Arthropathy, unspecified: Secondary | ICD-10-CM | POA: Diagnosis present

## 2012-12-25 DIAGNOSIS — E7800 Pure hypercholesterolemia, unspecified: Secondary | ICD-10-CM | POA: Diagnosis present

## 2012-12-25 HISTORY — PX: AORTIC VALVE REPLACEMENT: SHX41

## 2012-12-25 HISTORY — PX: INTRAOPERATIVE TRANSESOPHAGEAL ECHOCARDIOGRAM: SHX5062

## 2012-12-25 LAB — CBC
Platelets: 179 10*3/uL (ref 150–400)
Platelets: 171 10*3/uL (ref 150–400)
RBC: 3.84 MIL/uL — ABNORMAL LOW (ref 3.87–5.11)
RDW: 14.5 % (ref 11.5–15.5)
RDW: 14.2 % (ref 11.5–15.5)
WBC: 14.1 10*3/uL — ABNORMAL HIGH (ref 4.0–10.5)
WBC: 14.5 10*3/uL — ABNORMAL HIGH (ref 4.0–10.5)

## 2012-12-25 LAB — GLUCOSE, CAPILLARY
Glucose-Capillary: 94 mg/dL (ref 70–99)
Glucose-Capillary: 109 mg/dL — ABNORMAL HIGH (ref 70–99)
Glucose-Capillary: 110 mg/dL — ABNORMAL HIGH (ref 70–99)

## 2012-12-25 LAB — POCT I-STAT 4, (NA,K, GLUC, HGB,HCT)
Glucose, Bld: 139 mg/dL — ABNORMAL HIGH (ref 70–99)
Glucose, Bld: 114 mg/dL — ABNORMAL HIGH (ref 70–99)
Glucose, Bld: 96 mg/dL (ref 70–99)
Glucose, Bld: 103 mg/dL — ABNORMAL HIGH (ref 70–99)
HCT: 26 % — ABNORMAL LOW (ref 36.0–46.0)
HCT: 24 % — ABNORMAL LOW (ref 36.0–46.0)
HCT: 33 % — ABNORMAL LOW (ref 36.0–46.0)
Hemoglobin: 7.8 g/dL — ABNORMAL LOW (ref 12.0–15.0)
Hemoglobin: 8.2 g/dL — ABNORMAL LOW (ref 12.0–15.0)
Hemoglobin: 8.8 g/dL — ABNORMAL LOW (ref 12.0–15.0)
Potassium: 3.8 mEq/L (ref 3.5–5.1)
Potassium: 4.9 mEq/L (ref 3.5–5.1)
Potassium: 4.7 mEq/L (ref 3.5–5.1)
Sodium: 140 mEq/L (ref 135–145)
Sodium: 140 mEq/L (ref 135–145)

## 2012-12-25 LAB — POCT I-STAT 3, ART BLOOD GAS (G3+)
Acid-base deficit: 4 mmol/L — ABNORMAL HIGH (ref 0.0–2.0)
Bicarbonate: 23.8 mEq/L (ref 20.0–24.0)
Bicarbonate: 20.5 mEq/L (ref 20.0–24.0)
Bicarbonate: 19 mEq/L — ABNORMAL LOW (ref 20.0–24.0)
Patient temperature: 36.2
Patient temperature: 36.4
TCO2: 25 mmol/L (ref 0–100)
TCO2: 22 mmol/L (ref 0–100)
TCO2: 20 mmol/L (ref 0–100)
pCO2 arterial: 41.2 mmHg (ref 35.0–45.0)
pCO2 arterial: 40.8 mmHg (ref 35.0–45.0)
pH, Arterial: 7.37 (ref 7.350–7.450)
pH, Arterial: 7.303 — ABNORMAL LOW (ref 7.350–7.450)
pH, Arterial: 7.298 — ABNORMAL LOW (ref 7.350–7.450)
pO2, Arterial: 100 mmHg (ref 80.0–100.0)

## 2012-12-25 LAB — POCT I-STAT, CHEM 8
BUN: 16 mg/dL (ref 6–23)
Calcium, Ion: 1.21 mmol/L (ref 1.13–1.30)
Chloride: 109 mEq/L (ref 96–112)

## 2012-12-25 LAB — HEMOGLOBIN AND HEMATOCRIT, BLOOD
HCT: 24.1 % — ABNORMAL LOW (ref 36.0–46.0)
Hemoglobin: 8.7 g/dL — ABNORMAL LOW (ref 12.0–15.0)

## 2012-12-25 LAB — PROTIME-INR
INR: 1.42 (ref 0.00–1.49)
Prothrombin Time: 17 seconds — ABNORMAL HIGH (ref 11.6–15.2)

## 2012-12-25 LAB — MAGNESIUM: Magnesium: 3.3 mg/dL — ABNORMAL HIGH (ref 1.5–2.5)

## 2012-12-25 LAB — PLATELET COUNT: Platelets: 185 10*3/uL (ref 150–400)

## 2012-12-25 LAB — APTT: aPTT: 34 seconds (ref 24–37)

## 2012-12-25 LAB — CREATININE, SERUM
Creatinine, Ser: 1.09 mg/dL (ref 0.50–1.10)
GFR calc non Af Amer: 47 mL/min — ABNORMAL LOW (ref >90)

## 2012-12-25 SURGERY — REPLACEMENT, AORTIC VALVE, OPEN
Anesthesia: General | Site: Chest | Wound class: Clean

## 2012-12-25 MED ORDER — FENTANYL CITRATE 0.05 MG/ML IJ SOLN
INTRAMUSCULAR | Status: DC | PRN
  Administered 2012-12-25: 100 ug via INTRAVENOUS
  Administered 2012-12-25: 150 ug via INTRAVENOUS
  Administered 2012-12-25: 1000 ug via INTRAVENOUS
  Administered 2012-12-25: 250 ug via INTRAVENOUS
  Administered 2012-12-25: 100 ug via INTRAVENOUS

## 2012-12-25 MED ORDER — SODIUM CHLORIDE 0.9 % IV SOLN
INTRAVENOUS | Status: DC
  Filled 2012-12-25: qty 1

## 2012-12-25 MED ORDER — MORPHINE SULFATE 2 MG/ML IJ SOLN
1.0000 mg | INTRAMUSCULAR | Status: DC | PRN
  Administered 2012-12-25: 2 mg via INTRAVENOUS

## 2012-12-25 MED ORDER — METOPROLOL TARTRATE 12.5 MG HALF TABLET
12.5000 mg | ORAL_TABLET | Freq: Two times a day (BID) | ORAL | Status: DC
  Administered 2012-12-25 – 2012-12-30 (×10): 12.5 mg via ORAL
  Filled 2012-12-25 (×13): qty 1

## 2012-12-25 MED ORDER — OXYCODONE HCL 5 MG PO TABS
5.0000 mg | ORAL_TABLET | ORAL | Status: DC | PRN
  Administered 2012-12-25 – 2012-12-26 (×5): 10 mg via ORAL
  Administered 2012-12-27: 5 mg via ORAL
  Administered 2012-12-27: 10 mg via ORAL
  Administered 2012-12-27 – 2012-12-29 (×6): 5 mg via ORAL
  Filled 2012-12-25: qty 1
  Filled 2012-12-25: qty 2
  Filled 2012-12-25 (×2): qty 1
  Filled 2012-12-25 (×5): qty 2
  Filled 2012-12-25 (×4): qty 1

## 2012-12-25 MED ORDER — MIDAZOLAM HCL 5 MG/5ML IJ SOLN
INTRAMUSCULAR | Status: DC | PRN
  Administered 2012-12-25: 5 mg via INTRAVENOUS
  Administered 2012-12-25 (×2): 2 mg via INTRAVENOUS

## 2012-12-25 MED ORDER — THROMBIN 20000 UNITS EX SOLR
OROMUCOSAL | Status: DC | PRN
  Administered 2012-12-25: 09:00:00 via TOPICAL

## 2012-12-25 MED ORDER — METOPROLOL TARTRATE 25 MG/10 ML ORAL SUSPENSION
12.5000 mg | Freq: Two times a day (BID) | ORAL | Status: DC
  Administered 2012-12-28: 12.5 mg
  Filled 2012-12-25 (×13): qty 5

## 2012-12-25 MED ORDER — POTASSIUM CHLORIDE 10 MEQ/50ML IV SOLN: 10.0000 meq | INTRAVENOUS | Status: AC

## 2012-12-25 MED ORDER — SODIUM CHLORIDE 0.9 % IJ SOLN
3.0000 mL | Freq: Two times a day (BID) | INTRAMUSCULAR | Status: DC
  Administered 2012-12-26 – 2012-12-28 (×5): 3 mL via INTRAVENOUS

## 2012-12-25 MED ORDER — ACETAMINOPHEN 10 MG/ML IV SOLN
1000.0000 mg | Freq: Once | INTRAVENOUS | Status: AC
  Administered 2012-12-25: 1000 mg via INTRAVENOUS
  Filled 2012-12-25: qty 100

## 2012-12-25 MED ORDER — LACTATED RINGERS IV SOLN
INTRAVENOUS | Status: DC
  Administered 2012-12-25: 13:00:00 via INTRAVENOUS

## 2012-12-25 MED ORDER — SODIUM CHLORIDE 0.9 % IV SOLN
INTRAVENOUS | Status: DC
  Administered 2012-12-25: 12:00:00 via INTRAVENOUS

## 2012-12-25 MED ORDER — PROTAMINE SULFATE 10 MG/ML IV SOLN
INTRAVENOUS | Status: DC | PRN
  Administered 2012-12-25: 220 mg via INTRAVENOUS

## 2012-12-25 MED ORDER — SODIUM BICARBONATE 8.4 % IV SOLN
50.0000 meq | Freq: Once | INTRAVENOUS | Status: AC
  Administered 2012-12-25: 50 meq via INTRAVENOUS
  Filled 2012-12-25: qty 50

## 2012-12-25 MED ORDER — 0.9 % SODIUM CHLORIDE (POUR BTL) OPTIME
TOPICAL | Status: DC | PRN
  Administered 2012-12-25: 2000 mL

## 2012-12-25 MED ORDER — TIMOLOL MALEATE 0.5 % OP SOLG
1.0000 [drp] | Freq: Every day | OPHTHALMIC | Status: DC
  Administered 2012-12-26 – 2012-12-30 (×5): 1 [drp] via OPHTHALMIC
  Filled 2012-12-25: qty 5

## 2012-12-25 MED ORDER — ASPIRIN EC 325 MG PO TBEC
325.0000 mg | DELAYED_RELEASE_TABLET | Freq: Every day | ORAL | Status: DC
  Administered 2012-12-26 – 2012-12-30 (×5): 325 mg via ORAL
  Filled 2012-12-25 (×6): qty 1

## 2012-12-25 MED ORDER — DOCUSATE SODIUM 100 MG PO CAPS
200.0000 mg | ORAL_CAPSULE | Freq: Every day | ORAL | Status: DC
  Administered 2012-12-26 – 2012-12-29 (×4): 200 mg via ORAL
  Filled 2012-12-25 (×6): qty 2

## 2012-12-25 MED ORDER — ONDANSETRON HCL 4 MG/2ML IJ SOLN
4.0000 mg | Freq: Four times a day (QID) | INTRAMUSCULAR | Status: DC | PRN
  Administered 2012-12-25: 4 mg via INTRAVENOUS
  Filled 2012-12-25: qty 2
  Filled 2012-12-25: qty 4

## 2012-12-25 MED ORDER — ACETAMINOPHEN 500 MG PO TABS
1000.0000 mg | ORAL_TABLET | Freq: Four times a day (QID) | ORAL | Status: AC
  Administered 2012-12-25 – 2012-12-30 (×17): 1000 mg via ORAL
  Filled 2012-12-25 (×3): qty 2
  Filled 2012-12-25: qty 1
  Filled 2012-12-25 (×17): qty 2

## 2012-12-25 MED ORDER — DEXTROSE 5 % IV SOLN
0.0000 ug/min | INTRAVENOUS | Status: DC
  Filled 2012-12-25: qty 2

## 2012-12-25 MED ORDER — LATANOPROST 0.005 % OP SOLN
1.0000 [drp] | Freq: Every day | OPHTHALMIC | Status: DC
  Administered 2012-12-25 – 2012-12-30 (×6): 1 [drp] via OPHTHALMIC
  Filled 2012-12-25: qty 2.5

## 2012-12-25 MED ORDER — BISACODYL 5 MG PO TBEC
10.0000 mg | DELAYED_RELEASE_TABLET | Freq: Every day | ORAL | Status: DC
  Administered 2012-12-26 – 2012-12-28 (×3): 10 mg via ORAL
  Filled 2012-12-25 (×3): qty 2

## 2012-12-25 MED ORDER — MAGNESIUM SULFATE 40 MG/ML IJ SOLN
4.0000 g | Freq: Once | INTRAMUSCULAR | Status: AC
  Administered 2012-12-25: 4 g via INTRAVENOUS
  Filled 2012-12-25: qty 100

## 2012-12-25 MED ORDER — MIDAZOLAM HCL 2 MG/2ML IJ SOLN: 2.0000 mg | INTRAMUSCULAR | Status: DC | PRN

## 2012-12-25 MED ORDER — MORPHINE SULFATE 2 MG/ML IJ SOLN
2.0000 mg | INTRAMUSCULAR | Status: DC | PRN
  Administered 2012-12-25 – 2012-12-26 (×4): 4 mg via INTRAVENOUS
  Administered 2012-12-26: 2 mg via INTRAVENOUS
  Filled 2012-12-25: qty 2
  Filled 2012-12-25: qty 1
  Filled 2012-12-25: qty 2
  Filled 2012-12-25: qty 1
  Filled 2012-12-25 (×2): qty 2

## 2012-12-25 MED ORDER — GABAPENTIN 100 MG PO CAPS
100.0000 mg | ORAL_CAPSULE | Freq: Three times a day (TID) | ORAL | Status: DC
  Administered 2012-12-26 – 2012-12-30 (×15): 100 mg via ORAL
  Filled 2012-12-25 (×18): qty 1

## 2012-12-25 MED ORDER — DEXMEDETOMIDINE HCL IN NACL 200 MCG/50ML IV SOLN: 0.1000 ug/kg/h | INTRAVENOUS | Status: DC

## 2012-12-25 MED ORDER — LACTATED RINGERS IV SOLN
INTRAVENOUS | Status: DC | PRN
  Administered 2012-12-25 (×2): via INTRAVENOUS

## 2012-12-25 MED ORDER — ALBUMIN HUMAN 5 % IV SOLN
250.0000 mL | INTRAVENOUS | Status: AC | PRN
  Administered 2012-12-25 – 2012-12-26 (×2): 250 mL via INTRAVENOUS

## 2012-12-25 MED ORDER — LACTATED RINGERS IV SOLN: 500.0000 mL | Freq: Once | INTRAVENOUS | Status: AC | PRN

## 2012-12-25 MED ORDER — THROMBIN 20000 UNITS EX SOLR
CUTANEOUS | Status: AC
  Filled 2012-12-25: qty 20000

## 2012-12-25 MED ORDER — BISACODYL 10 MG RE SUPP: 10.0000 mg | Freq: Every day | RECTAL | Status: DC

## 2012-12-25 MED ORDER — VANCOMYCIN HCL IN DEXTROSE 1-5 GM/200ML-% IV SOLN
1000.0000 mg | Freq: Once | INTRAVENOUS | Status: AC
  Administered 2012-12-25: 1000 mg via INTRAVENOUS
  Filled 2012-12-25: qty 200

## 2012-12-25 MED ORDER — ROCURONIUM BROMIDE 100 MG/10ML IV SOLN
INTRAVENOUS | Status: DC | PRN
  Administered 2012-12-25: 50 mg via INTRAVENOUS

## 2012-12-25 MED ORDER — PANTOPRAZOLE SODIUM 40 MG PO TBEC
40.0000 mg | DELAYED_RELEASE_TABLET | Freq: Every day | ORAL | Status: DC
  Administered 2012-12-27 – 2012-12-30 (×4): 40 mg via ORAL
  Filled 2012-12-25 (×5): qty 1

## 2012-12-25 MED ORDER — SODIUM CHLORIDE 0.9 % IJ SOLN: 3.0000 mL | INTRAMUSCULAR | Status: DC | PRN

## 2012-12-25 MED ORDER — SODIUM CHLORIDE 0.9 % IV SOLN: 250.0000 mL | INTRAVENOUS | Status: DC

## 2012-12-25 MED ORDER — METOPROLOL TARTRATE 1 MG/ML IV SOLN
2.5000 mg | INTRAVENOUS | Status: DC | PRN
  Administered 2012-12-27 – 2012-12-28 (×2): 5 mg via INTRAVENOUS
  Filled 2012-12-25 (×2): qty 5

## 2012-12-25 MED ORDER — FAMOTIDINE IN NACL 20-0.9 MG/50ML-% IV SOLN
20.0000 mg | Freq: Two times a day (BID) | INTRAVENOUS | Status: AC
  Administered 2012-12-25: 20 mg via INTRAVENOUS

## 2012-12-25 MED ORDER — MIDAZOLAM HCL 2 MG/2ML IJ SOLN
INTRAMUSCULAR | Status: AC
  Filled 2012-12-25: qty 2

## 2012-12-25 MED ORDER — LACTATED RINGERS IV SOLN
INTRAVENOUS | Status: DC | PRN
  Administered 2012-12-25: 07:00:00 via INTRAVENOUS

## 2012-12-25 MED ORDER — ACETAMINOPHEN 160 MG/5ML PO SOLN
975.0000 mg | Freq: Four times a day (QID) | ORAL | Status: AC
  Filled 2012-12-25: qty 40.6

## 2012-12-25 MED ORDER — SODIUM CHLORIDE 0.45 % IV SOLN
INTRAVENOUS | Status: DC
  Administered 2012-12-25 – 2012-12-26 (×2): via INTRAVENOUS

## 2012-12-25 MED ORDER — LEVOFLOXACIN IN D5W 750 MG/150ML IV SOLN
750.0000 mg | Freq: Once | INTRAVENOUS | Status: AC
  Administered 2012-12-26: 750 mg via INTRAVENOUS
  Filled 2012-12-25: qty 150

## 2012-12-25 MED ORDER — SODIUM CHLORIDE 0.9 % IV SOLN
INTRAVENOUS | Status: DC | PRN
  Administered 2012-12-25: 11:00:00 via INTRAVENOUS

## 2012-12-25 MED ORDER — ASPIRIN 81 MG PO CHEW: 324.0000 mg | CHEWABLE_TABLET | Freq: Every day | ORAL | Status: DC

## 2012-12-25 MED ORDER — INSULIN REGULAR BOLUS VIA INFUSION
0.0000 [IU] | Freq: Three times a day (TID) | INTRAVENOUS | Status: DC
  Filled 2012-12-25: qty 10

## 2012-12-25 MED ORDER — VECURONIUM BROMIDE 10 MG IV SOLR
INTRAVENOUS | Status: DC | PRN
  Administered 2012-12-25: 2 mg via INTRAVENOUS
  Administered 2012-12-25: 3 mg via INTRAVENOUS

## 2012-12-25 MED ORDER — FENTANYL CITRATE 0.05 MG/ML IJ SOLN
INTRAMUSCULAR | Status: AC
  Filled 2012-12-25: qty 2

## 2012-12-25 MED ORDER — PROPOFOL 10 MG/ML IV BOLUS
INTRAVENOUS | Status: DC | PRN
  Administered 2012-12-25: 70 mg via INTRAVENOUS

## 2012-12-25 MED ORDER — HEPARIN SODIUM (PORCINE) 1000 UNIT/ML IJ SOLN
INTRAMUSCULAR | Status: DC | PRN
  Administered 2012-12-25: 26000 [IU] via INTRAVENOUS

## 2012-12-25 MED ORDER — DEXTROSE 5 % IV SOLN: 1.5000 g | Freq: Two times a day (BID) | INTRAVENOUS | Status: DC

## 2012-12-25 MED ORDER — NITROGLYCERIN IN D5W 200-5 MCG/ML-% IV SOLN: 0.0000 ug/min | INTRAVENOUS | Status: DC

## 2012-12-25 MED FILL — Sodium Chloride Irrigation Soln 0.9%: Qty: 3000 | Status: AC

## 2012-12-25 MED FILL — Mannitol IV Soln 20%: INTRAVENOUS | Qty: 500 | Status: AC

## 2012-12-25 MED FILL — Heparin Sodium (Porcine) Inj 1000 Unit/ML: INTRAMUSCULAR | Qty: 10 | Status: AC

## 2012-12-25 MED FILL — Electrolyte-R (PH 7.4) Solution: INTRAVENOUS | Qty: 1000 | Status: AC

## 2012-12-25 MED FILL — Sodium Chloride IV Soln 0.9%: INTRAVENOUS | Qty: 1000 | Status: AC

## 2012-12-25 MED FILL — Lidocaine HCl IV Inj 20 MG/ML: INTRAVENOUS | Qty: 10 | Status: AC

## 2012-12-25 MED FILL — Heparin Sodium (Porcine) Inj 1000 Unit/ML: INTRAMUSCULAR | Qty: 30 | Status: AC

## 2012-12-25 SURGICAL SUPPLY — 60 items
ADAPTER CARDIO PERF ANTE/RETRO (ADAPTER) ×3 IMPLANT
ATTRACTOMAT 16X20 MAGNETIC DRP (DRAPES) ×3 IMPLANT
BAG DECANTER FOR FLEXI CONT (MISCELLANEOUS) ×3 IMPLANT
BLADE STERNUM SYSTEM 6 (BLADE) ×3 IMPLANT
BLADE SURG 15 STRL LF DISP TIS (BLADE) ×2 IMPLANT
BLADE SURG 15 STRL SS (BLADE) ×1
CANISTER SUCTION 2500CC (MISCELLANEOUS) ×3 IMPLANT
CANNULA GUNDRY RCSP 15FR (MISCELLANEOUS) ×3 IMPLANT
CATH ROBINSON RED A/P 18FR (CATHETERS) ×9 IMPLANT
CATH THORACIC 36FR (CATHETERS) ×3 IMPLANT
CATH THORACIC 36FR RT ANG (CATHETERS) ×3 IMPLANT
CLOTH BEACON ORANGE TIMEOUT ST (SAFETY) ×3 IMPLANT
CONT SPEC STER OR (MISCELLANEOUS) ×3 IMPLANT
COVER SURGICAL LIGHT HANDLE (MISCELLANEOUS) ×3 IMPLANT
CRADLE DONUT ADULT HEAD (MISCELLANEOUS) ×3 IMPLANT
DRAPE CARDIOVASCULAR INCISE (DRAPES) ×1
DRAPE SLUSH MACHINE 52X66 (DRAPES) IMPLANT
DRAPE SLUSH/WARMER DISC (DRAPES) IMPLANT
DRAPE SRG 135X102X78XABS (DRAPES) ×2 IMPLANT
DRSG COVADERM 4X14 (GAUZE/BANDAGES/DRESSINGS) ×3 IMPLANT
ELECT CAUTERY BLADE 6.4 (BLADE) ×3 IMPLANT
ELECT REM PT RETURN 9FT ADLT (ELECTROSURGICAL) ×6
ELECTRODE REM PT RTRN 9FT ADLT (ELECTROSURGICAL) ×4 IMPLANT
GLOVE BIO SURGEON STRL SZ 6 (GLOVE) IMPLANT
GLOVE BIO SURGEON STRL SZ 6.5 (GLOVE) ×12 IMPLANT
GLOVE BIO SURGEON STRL SZ7 (GLOVE) ×6 IMPLANT
GLOVE BIO SURGEON STRL SZ7.5 (GLOVE) ×6 IMPLANT
GLOVE EUDERMIC 7 POWDERFREE (GLOVE) ×6 IMPLANT
GOWN PREVENTION PLUS XLARGE (GOWN DISPOSABLE) ×3 IMPLANT
GOWN STRL NON-REIN LRG LVL3 (GOWN DISPOSABLE) ×12 IMPLANT
HEART VENT LT CURVED (MISCELLANEOUS) ×3 IMPLANT
HEMOSTAT POWDER SURGIFOAM 1G (HEMOSTASIS) ×9 IMPLANT
KIT BASIN OR (CUSTOM PROCEDURE TRAY) ×3 IMPLANT
KIT CATH CPB BARTLE (MISCELLANEOUS) ×3 IMPLANT
KIT ROOM TURNOVER OR (KITS) ×3 IMPLANT
KIT SUCTION CATH 14FR (SUCTIONS) ×3 IMPLANT
NS IRRIG 1000ML POUR BTL (IV SOLUTION) ×18 IMPLANT
PACK OPEN HEART (CUSTOM PROCEDURE TRAY) ×3 IMPLANT
PAD ARMBOARD 7.5X6 YLW CONV (MISCELLANEOUS) ×6 IMPLANT
SPONGE GAUZE 4X4 12PLY (GAUZE/BANDAGES/DRESSINGS) ×3 IMPLANT
SUT BONE WAX W31G (SUTURE) ×3 IMPLANT
SUT ETHIBON 2 0 V 52N 30 (SUTURE) ×6 IMPLANT
SUT ETHIBOND NAB MH 2-0 36IN (SUTURE) ×3 IMPLANT
SUT PROLENE 3 0 SH 1 (SUTURE) ×3 IMPLANT
SUT PROLENE 3 0 SH DA (SUTURE) IMPLANT
SUT PROLENE 4 0 RB 1 (SUTURE) ×3
SUT PROLENE 4-0 RB1 .5 CRCL 36 (SUTURE) ×6 IMPLANT
SUT STEEL 6MS V (SUTURE) ×6 IMPLANT
SUT VIC AB 1 CTX 36 (SUTURE) ×2
SUT VIC AB 1 CTX36XBRD ANBCTR (SUTURE) ×4 IMPLANT
SUTURE E-PAK OPEN HEART (SUTURE) ×3 IMPLANT
SYSTEM SAHARA CHEST DRAIN ATS (WOUND CARE) ×3 IMPLANT
TAPE CLOTH SURG 4X10 WHT LF (GAUZE/BANDAGES/DRESSINGS) ×3 IMPLANT
TOWEL OR 17X24 6PK STRL BLUE (TOWEL DISPOSABLE) ×6 IMPLANT
TOWEL OR 17X26 10 PK STRL BLUE (TOWEL DISPOSABLE) ×6 IMPLANT
TRAY FOLEY IC TEMP SENS 14FR (CATHETERS) ×3 IMPLANT
TUBE SUCT INTRACARD DLP 20F (MISCELLANEOUS) ×3 IMPLANT
UNDERPAD 30X30 INCONTINENT (UNDERPADS AND DIAPERS) ×3 IMPLANT
VALVE MAGNA EASE 21MM (Prosthesis & Implant Heart) ×3 IMPLANT
WATER STERILE IRR 1000ML POUR (IV SOLUTION) ×6 IMPLANT

## 2012-12-25 NOTE — Brief Op Note (Signed)
   [address]       [place] [address]             [phone]   12/25/2012  10:45 AM  PATIENT:  Kelli Stone  79 y.o. female  PRE-OPERATIVE DIAGNOSIS:  severe AS  POST-OPERATIVE DIAGNOSIS:  aortic stenosis  PROCEDURE:  Procedure(s): AORTIC VALVE REPLACEMENT (AVR)# 21 MAGNAEASE PERICARDIAL INTRAOPERATIVE TRANSESOPHAGEAL ECHOCARDIOGRAM  SURGEON:  Surgeon(s): Alleen Borne, MD  PHYSICIAN ASSISTANT: Kirtan Sada PA-C  ANESTHESIA:   general  PATIENT CONDITION:  ICU - intubated and hemodynamically stable.  PRE-OPERATIVE WEIGHT: 72.1kg  COMPLICATIONS: NO KNOWN

## 2012-12-25 NOTE — Progress Notes (Signed)
S/p AVR  BP 112/64  Pulse 80  Temp(Src) 97.3 F (36.3 C) (Core (Comment))  Resp 14  Ht 5\' 3"  (1.6 m)  Wt 158 lb 11.7 oz (72 kg)  BMI 28.13 kg/m2  SpO2 100%  CO 4.02   Intake/Output Summary (Last 24 hours) at 12/25/12 1825 Last data filed at 12/25/12 1800  Gross per 24 hour  Intake   3680 ml  Output   2705 ml  Net    975 ml    Extubated  Doing well early postop

## 2012-12-25 NOTE — H&P (Signed)
[address]       [place] [address]             [phone]      Reason for Consult: Severe aortic stenosis  Referring Physician: Dr. Willa Rough  Kelli Stone is an 79 y.o. female.  HPI:  79 year old fairly active woman with history of moderate aortic stenosis by 2D echo in 01/2011 with velocity of 351 m/sec and mean gradient of 25. She recently presented to [facility] with dizziness that was occuring in the shower and while sitting at the table eating. She says that in the shower she just had to lean up in the corner of the shower so she wouldn't fall. When she was sitting at the table she got so dizzy that her hands started shaking. She has also had some exertional shortness of breath while working around the house. She also reports some feeling of fullness in the chest with activity that passes with rest. A 2D echo on 11/24/12 showed an aortic valve velocity of 4.9 m/sec with a mean gradient of 58 mm Hg and a valve area of 0.65 cm2. Cardiac cath mean gradient was only 15.5 with AVA = 1.3 with CI = 2.66 with normal right and left heart pressures. There is no significant coronary stenoses. LV function is normal with no significant gradient across the LVOT. TEE was done yesterday and shows calcified valve leaflets with poor mobility. The mean gradient was 38mm Hg with AVA 0.79 cm2.  Past Medical History   Diagnosis  Date   .  Glaucoma(365)    .  GERD (gastroesophageal reflux disease)    .  Personal history of tobacco use, presenting hazards to health    .  Personal history of allergy to penicillin    .  Personal history of allergy to sulfonamides    .  Vertigo      HISTORY OF   .  Diabetes mellitus      diet controlled   .  HTN (hypertension)    .  Pure hypercholesterolemia    .  Heart murmur     Past Surgical History   Procedure  Laterality  Date   .  Appendectomy     .  Right shoulder     .  Shoulder surgery     .  Breast surgery   bilateral   .   Vaginal hysterectomy     .  Total knee arthroplasty   07/01/2011     Procedure: TOTAL KNEE ARTHROPLASTY; Surgeon: Fuller Canada, MD; Location: [facility] ORS; Service: Orthopedics; Laterality: Right; Depuy   .  Colonoscopy   05/07/2012     Procedure: COLONOSCOPY; Surgeon: Malissa Hippo, MD; Location: [facility] ENDO SUITE; Service: Endoscopy; Laterality: N/A; 200   .  Tee without cardioversion  N/A  11/26/2012     Procedure: TRANSESOPHAGEAL ECHOCARDIOGRAM (TEE); Surgeon: Laurey Morale, MD; Location: [facility] ENDOSCOPY; Service: Cardiovascular; Laterality: N/A;    Family History   Problem  Relation  Age of Onset   .  Cancer     .  Diabetes     .  Diabetes  Mother    .  Bone cancer  Father    .  Diabetes  Sister    .  Kidney disease  Sister    .  Stomach cancer  Sister    .  Stroke  Brother    .  Bone cancer  Brother    .  Healthy  Son    Social History: reports that she quit smoking about 29 years ago. Her smoking use included Cigarettes. She has a 3 pack-year smoking history. She has never used smokeless tobacco. She reports that she does not drink alcohol or use illicit drugs.  Allergies:  Allergies   Allergen  Reactions   .  Shellfish Allergy  Nausea Only and Other (See Comments)     Vomiting & diarrhea   .  Bonine (Meclizine Hcl)      Nervous   .  Lotrel (Amlodipine Besy-Benazepril Hcl)      Made legs Ache   .  Benadryl (Diphenhydramine Hcl)  Other (See Comments)     nervous   .  Cephalexin      Pt can't remember   .  Other  Other (See Comments)     GUIATUSSIN. Unknown reaction   .  Sulfa Antibiotics  Other (See Comments)     unknown   .  Zocor (Simvastatin - High Dose)      Aching   .  Celebrex (Celecoxib)  Other (See Comments)     Chest discomfort   .  Dilacor Xr (Diltiazem Hcl)  Other (See Comments)     Didn't dissolve   .  Erythromycin  Nausea Only   .  Lescol  Other (See Comments)     aching   .  Metformin And Related  Diarrhea   .  Methylprednisolone Acetate  Itching and Other  (See Comments)   .  Naprosyn (Naproxen)  Nausea Only   .  Penicillins  Other (See Comments)     sleepy   .  Stadol (Butorphanol Tartrate)  Other (See Comments)     numb   .  Trinalin (Azatadine-Pseudoephedrine)  Other (See Comments)     nervous   Medications:  I have reviewed the patient's current medications.  Prior to Admission:  Prescriptions prior to admission   Medication  Sig  Dispense  Refill   .  amLODipine (NORVASC) 5 MG tablet  Take 5 mg by mouth daily.     Marland Kitchen  gabapentin (NEURONTIN) 100 MG capsule  Take 1 capsule (100 mg total) by mouth 3 (three) times daily.  90 capsule  2   .  HYDROcodone-acetaminophen (NORCO) 7.5-325 MG per tablet  Take 1 tablet by mouth every 4 (four) hours as needed for pain.  60 tablet  5   .  latanoprost (XALATAN) 0.005 % ophthalmic solution  Place 1 drop into both eyes at bedtime.     Marland Kitchen  lisinopril (PRINIVIL,ZESTRIL) 40 MG tablet  Take 40 mg by mouth daily.     Marland Kitchen  omeprazole (PRILOSEC) 20 MG capsule  Take 20 mg by mouth daily.     .  timolol (TIMOPTIC-XR) 0.5 % ophthalmic gel-forming  Place 1 drop into both eyes daily.     Scheduled:  .  amLODipine  10 mg  Oral  Daily   .  latanoprost  1 drop  Both Eyes  QHS   .  lisinopril  40 mg  Oral  Daily   .  pantoprazole  40 mg  Oral  Daily   .  sodium chloride  3 mL  Intravenous  Q12H   .  sodium chloride  3 mL  Intravenous  Q12H   .  timolol  1 drop  Both Eyes  Daily   Continuous:  .  sodium chloride  500 mL (11/26/12 1448)   .  sodium  chloride    UJW:JXBJYN chloride, acetaminophen, ALPRAZolam, menthol-cetylpyridinium, ondansetron (ZOFRAN) IV, phenol, sodium chloride  Anti-infectives    None     No results found for this or any previous visit (from the past 48 hour(s)).  No results found.  Review of Systems  Constitutional: Negative for fever, chills, weight loss, malaise/fatigue and diaphoresis.  HENT: Negative.  Eyes: Negative.  Respiratory: Positive for shortness of breath.  With exertion.    Cardiovascular: Negative for chest pain, palpitations, orthopnea, leg swelling and PND.  Some fullness in chest with exertion.  Gastrointestinal: Negative.  Genitourinary: Negative.  Musculoskeletal: Positive for joint pain.  Chronic pain in right knee after TKR. Recently treated with prednisone. She says her dizziness started after the prednisone was started.  Skin: Negative.  Neurological: Positive for dizziness. Negative for focal weakness, seizures, loss of consciousness and weakness.  Endo/Heme/Allergies: Negative.  Psychiatric/Behavioral: Negative.  Blood pressure 138/66, pulse 96, temperature 98.4 F (36.9 C), temperature source Oral, resp. rate 19, height 5\' 3"  (1.6 m), weight 68.7 kg (151 lb 7.3 oz), SpO2 100.00%.  Physical Exam  Constitutional: She is oriented to person, place, and time. She appears well-developed and well-nourished. No distress.  HENT:  Head: Normocephalic and atraumatic.  Mouth/Throat: Oropharynx is clear and moist.  Eyes: Conjunctivae and EOM are normal. Pupils are equal, round, and reactive to light.  Neck: Normal range of motion. Neck supple. No JVD present. No thyromegaly present.  Cardiovascular: Normal rate, regular rhythm and intact distal pulses.  Murmur heard. 3/6 crescendo/decrescendo murmur over aorta transmitted into both sides of the neck.  Respiratory: Effort normal and breath sounds normal. No respiratory distress. She has no rales. She exhibits no tenderness.  GI: Soft. Bowel sounds are normal. She exhibits no distension and no mass. There is no tenderness.  Musculoskeletal: Normal range of motion. She exhibits no edema and no tenderness.  Right knee scar from TKR.  Lymphadenopathy:  She has no cervical adenopathy.  Neurological: She is alert and oriented to person, place, and time. She has normal strength. No cranial nerve deficit or sensory deficit.  Skin: Skin is warm and dry.  Psychiatric: She has a normal mood and affect.  [facility]* *[facility]* [address] [place], [place] [address] [phone]  ------------------------------------------------------------ Transthoracic Echocardiography  Patient: Shanese, Riemenschneider MR #: [id number] Study Date: 11/24/2012 Gender: F Age: 30 Height: 160cm Weight: 70kg BSA: 1.36m^2 Pt. Status: Room: 2036  ADMITTING Shawnie Pons ATTENDING Shawnie Pons PERFORMING [place], [facility] SONOGRAPHER Nolon Rod, RDCS ORDERING Verne Carrow Myna Hidalgo, Christopher cc:  ------------------------------------------------------------ LV EF: 65%  ------------------------------------------------------------ Indications: Aortic stenosis 424.1.  ------------------------------------------------------------ History: PMH: Aortic valve disease. Risk factors: Former tobacco use. Hypertension. Hypercholesterolemia.  ------------------------------------------------------------ Study Conclusions  - Left ventricle: The cavity size was normal. Wall thickness was increased in a pattern of moderate LVH. The estimated ejection fraction was 65%. Wall motion was normal; there were no regional wall motion abnormalities. Doppler parameters are consistent with abnormal left ventricular relaxation (grade 1 diastolic dysfunction). - Aortic valve: Heavy calcification. There is one area of opening seen. The doppler data is c/w very severe AS. Mean gradient: 58mm Hg (S). Peak gradient: 98mm Hg (S). Peak velocity above the aortic valve 4.85m/sec. Valve area: 0.71cm^2(VTI). Valve area: 0.65cm^2 (Vmax). Transthoracic echocardiography. M-mode, complete 2D, spectral Doppler, and color Doppler. Height: Height: 160cm. Height: 63in. Weight: Weight: 70kg. Weight: 154lb. Body mass index: BMI: 27.3kg/m^2. Body surface area: BSA: 1.19m^2. Blood pressure: 147/63. Patient status: Inpatient. Location:  Bedside.  ------------------------------------------------------------  ------------------------------------------------------------ Left ventricle: The cavity size was normal. Wall thickness was increased in a pattern of moderate LVH. The estimated ejection fraction was 65%. Wall motion was normal; there were no regional wall motion abnormalities. Doppler parameters are consistent with abnormal left ventricular relaxation (grade 1 diastolic dysfunction).  ------------------------------------------------------------ Aortic valve: Heavy calcification. There is one area of opening seen. The doppler data is c/w very severe AS. Doppler: VTI ratio of LVOT to aortic valve: 0.22. Valve area: 0.71cm^2(VTI). Indexed valve area: 0.4cm^2/m^2 (VTI). Peak velocity ratio of LVOT to aortic valve: 0.21. Valve area: 0.65cm^2 (Vmax). Indexed valve area: 0.37cm^2/m^2 (Vmax). Mean gradient: 58mm Hg (S). Peak gradient: 98mm Hg (S).  ------------------------------------------------------------ Aorta: Aortic root: The aortic root was normal in size.  ------------------------------------------------------------ Mitral valve: Structurally normal valve. Leaflet separation was normal. Doppler: Transvalvular velocity was within the normal range. There was no evidence for stenosis. No regurgitation.  ------------------------------------------------------------ Left atrium: The atrium was normal in size.  ------------------------------------------------------------ Right ventricle: The cavity size was normal. Systolic function was normal.  ------------------------------------------------------------ Pulmonic valve: The valve appears to be grossly normal. Doppler: No significant regurgitation.  ------------------------------------------------------------ Tricuspid valve: Structurally normal valve. Leaflet separation was normal. Doppler: Transvalvular velocity was within the normal range. No  regurgitation.  ------------------------------------------------------------ Right atrium: The atrium was at the upper limits of normal in size.  ------------------------------------------------------------ Pericardium: There was no pericardial effusion.  ------------------------------------------------------------ Systemic veins: Inferior vena cava: The vessel was normal in size; the respirophasic diameter changes were in the normal range (= 50%); findings are consistent with normal central venous pressure.  ------------------------------------------------------------  2D measurements Normal Doppler measurements Normal Left ventricle Left ventricle LVID ED, 40.8 mm 43-52 Ea, lat 4.5 cm/s ------ chord, ann, tiss PLAX DP LVID ES, 26.5 mm 23-38 E/Ea, lat 12.7 ------ chord, ann, tiss 3 PLAX DP FS, chord, 35 % >29 Ea, med 6.58 cm/s ------ PLAX ann, tiss LVPW, ED 11.6 mm ------ DP IVS/LVPW 1.35 <1.3 E/Ea, med 8.71 ------ ratio, ED ann, tiss Ventricular septum DP IVS, ED 15.7 mm ------ LVOT LVOT Peak vel, 103 cm/s ------ Diam, S 20 mm ------ S Area 3.14 cm^2 ------ VTI, S 22.7 cm ------ Aorta Aortic valve Root diam, 29 mm ------ Peak vel, 496 cm/s ------ ED S Left atrium Mean vel, 352 cm/s ------ AP dim 34 mm ------ S AP dim 1.91 cm/m^2 <2.2 VTI, S 101 cm ------ index Mean 58 mm Hg ------ gradient, S Peak 98 mm Hg ------ gradient, S VTI ratio 0.22 ------ LVOT/AV Area, VTI 0.71 cm^2 ------ Area index 0.4 cm^2/m ------ (VTI) ^2 Peak vel 0.21 ------ ratio, LVOT/AV Area, Vmax 0.65 cm^2 ------ Area index 0.37 cm^2/m ------ (Vmax) ^2 Mitral valve Peak E vel 57.3 cm/s ------ Peak A vel 96.7 cm/s ------ Decelerati 162 ms 150-23 on time 0 Peak E/A 0.6 ------ ratio  ------------------------------------------------------------ Prepared and Electronically Authenticated by  Willa Rough 2014-03-11T19:02:[id number]  Cardiac Cath:  Hemodynamic Findings:  Ao: 207/85   LV: 230/7/11  RA: 6  RV: 35/6/8  PA: 30/11 (mean 18)  PCWP: 9  Fick Cardiac Output: 4.6 L/min  Fick Cardiac Index: 2.66 L/min  Central Aortic Saturation: 96%  Pulmonary Artery Saturation: 61%  Aortic valve data: Peak to peak gradient  Mean gradient 15.5 mmHg  AVA= 1.3 cm2  Angiographic Findings:  Left main: No obstructive disease noted.  Left Anterior Descending Artery: Large caliber vessel that courses to the apex. The mid vessel has mild diffuse 20% stenosis. The diagonal  branch is moderate in caliber with ostial 30% stenosis.  Circumflex Artery: Large caliber vessel with 40% mid stenosis. Moderate caliber OM branch with mild plaque.  Right Coronary Artery: Large dominant vessel with 20% mid stenosis.  Left Ventricular Angiogram: Deferred.  Impression:  1. Mild non-obstructive CAD  2. Moderately severe aortic stenosis  Recommendations: She has mild CAD. Her aortic stenosis appears to be moderately severe. Will arrange an echo today. Further planning pending echo.  Procedure: TEE  Indication: Aortic stenosis  Sedation: Versed 3 mg IV, Fentanyl 25 mcg IV  Findings: Please see echo section for full report. Normal LV size with moderate asymmetric basal septal hypertrophy. EF 60%. There was no LV outflow tract gradient or mitral valve SAM. There was no subaortic membrane. The aortic valve was trileaflet with severe calcification. I suspect stenosis is severe: The mean gradient I obtained by TEE was 38 mmHg with calculated valve area 0.79 cm^2. Normal RV size and systolic function.  [name]  11/26/2012  4:14 PM       Impression/Plan:  She has severe symptomatic aortic stenosis which will require aortic valve replacement. I discussed all the studies with the patient and her family including her son by telephone conference. They questioned if she was candidate for TAVR but I don't think she is. I put her risk factors into the STS risk factor calculator and her risk of  mortality is only between 3 and 4%. I would plan to use a tissue valve given her age. I discussed the operative procedure with the patient and family including alternatives, benefits and risks; including but not limited to bleeding, blood transfusion, infection, stroke, myocardial infarction, graft failure, heart block requiring a permanent pacemaker, organ dysfunction, and death. Kelli Stone understands and agrees to proceed. We will schedule surgery for Tuesday 12/22/12.

## 2012-12-25 NOTE — Interval H&P Note (Signed)
History and Physical Interval Note:  12/25/2012 7:17 AM  Kelli Stone  has presented today for surgery, with the diagnosis of severe AS  The various methods of treatment have been discussed with the patient and family. After consideration of risks, benefits and other options for treatment, the patient has consented to  Procedure(s): AORTIC VALVE REPLACEMENT (AVR) (N/A) INTRAOPERATIVE TRANSESOPHAGEAL ECHOCARDIOGRAM (N/A) as a surgical intervention .  The patient's history has been reviewed, patient examined, no change in status, stable for surgery.  I have reviewed the patient's chart and labs.  Questions were answered to the patient's satisfaction.     Alleen Borne

## 2012-12-25 NOTE — Preoperative (Signed)
Beta Blockers   Reason not to administer Beta Blockers:Not Applicable 

## 2012-12-25 NOTE — Progress Notes (Signed)
  Echocardiogram Echocardiogram Transesophageal has been performed.  Georgian Co 12/25/2012, 8:24 AM

## 2012-12-25 NOTE — Progress Notes (Signed)
Reported off to oncoming shift RN. No acute distress noted. VSS. Safety maintained.

## 2012-12-25 NOTE — Procedures (Signed)
Extubation Procedure Note  Patient Details:   Name: Kelli Stone DOB: [birth year] MRN: [id number]   Airway Documentation:     Evaluation  O2 sats: stable throughout and currently acceptable Complications: No apparent complications Patient did tolerate procedure well. Bilateral Breath Sounds: Clear   Yes Pt awake and alert, extubated per protocol. PLaced on 3L [place], sat 100%. Positive cuff leak, NIF-28, VC 700. Pt able to vocalize.  Arloa Koh 12/25/2012, 5:32 PM

## 2012-12-25 NOTE — Transfer of Care (Signed)
Immediate Anesthesia Transfer of Care Note  Patient: Kelli Stone  Procedure(s) Performed: Procedure(s): AORTIC VALVE REPLACEMENT (AVR) (N/A) INTRAOPERATIVE TRANSESOPHAGEAL ECHOCARDIOGRAM (N/A)  Patient Location: PACU and ICU  Anesthesia Type:General  Level of Consciousness: unresponsive  Airway & Oxygen Therapy: Patient placed on Ventilator (see vital sign flow sheet for setting)  Post-op Assessment: Report given to PACU RN and Post -op Vital signs reviewed and stable  Post vital signs: Reviewed and stable  Complications: No apparent anesthesia complications

## 2012-12-25 NOTE — Anesthesia Preprocedure Evaluation (Signed)
Anesthesia Evaluation  Patient identified by MRN, date of birth, ID band Patient awake    Reviewed: Allergy & Precautions, H&P , NPO status , Patient's Chart, lab work & pertinent test results, reviewed documented beta blocker date and time   Airway Mallampati: II TM Distance: >3 FB Neck ROM: full    Dental   Pulmonary shortness of breath and with exertion,  breath sounds clear to auscultation        Cardiovascular hypertension, On Medications + CAD and + Peripheral Vascular Disease + Valvular Problems/Murmurs AS Rhythm:regular     Neuro/Psych  Neuromuscular disease negative psych ROS   GI/Hepatic negative GI ROS, Neg liver ROS, GERD-  Medicated and Controlled,  Endo/Other  diabetes, Insulin Dependent  Renal/GU negative Renal ROS  negative genitourinary   Musculoskeletal   Abdominal   Peds  Hematology  (+) anemia ,   Anesthesia Other Findings See surgeon's H&P   Reproductive/Obstetrics negative OB ROS                           Anesthesia Physical Anesthesia Plan  ASA: III  Anesthesia Plan: General   Post-op Pain Management:    Induction: Intravenous  Airway Management Planned: Oral ETT  Additional Equipment: Arterial line, CVP, PA Cath, TEE and Ultrasound Guidance Line Placement  Intra-op Plan:   Post-operative Plan: Post-operative intubation/ventilation  Informed Consent: I have reviewed the patients History and Physical, chart, labs and discussed the procedure including the risks, benefits and alternatives for the proposed anesthesia with the patient or authorized representative who has indicated his/her understanding and acceptance.   Dental Advisory Given  Plan Discussed with: CRNA and Surgeon  Anesthesia Plan Comments:         Anesthesia Quick Evaluation

## 2012-12-25 NOTE — Anesthesia Procedure Notes (Signed)
Procedure Name: Intubation Date/Time: 12/25/2012 7:45 AM Performed by: Armandina Gemma Pre-anesthesia Checklist: Patient identified, Timeout performed, Emergency Drugs available, Suction available and Patient being monitored Patient Re-evaluated:Patient Re-evaluated prior to inductionOxygen Delivery Method: Circle system utilized Preoxygenation: Pre-oxygenation with 100% oxygen Intubation Type: IV induction Ventilation: Mask ventilation without difficulty Laryngoscope Size: Miller and 2 Grade View: Grade I Tube type: Oral Tube size: 7.5 mm Number of attempts: 1 Airway Equipment and Method: Stylet Placement Confirmation: ETT inserted through vocal cords under direct vision,  breath sounds checked- equal and bilateral and positive ETCO2 Secured at: 22 cm Tube secured with: Tape Dental Injury: Teeth and Oropharynx as per pre-operative assessment  Comments: IV induction Fredricks- bilat BS per Fredricks, atraumatic mouth as preop

## 2012-12-25 NOTE — Anesthesia Postprocedure Evaluation (Signed)
  Anesthesia Post-op Note  Patient: Kelli Stone  Procedure(s) Performed: Procedure(s): AORTIC VALVE REPLACEMENT (AVR) (N/A) INTRAOPERATIVE TRANSESOPHAGEAL ECHOCARDIOGRAM (N/A)  Patient Location: SICU  Anesthesia Type:General  Level of Consciousness: sedated and Patient remains intubated per anesthesia plan  Airway and Oxygen Therapy: Patient remains intubated per anesthesia plan and Patient placed on Ventilator (see vital sign flow sheet for setting)  Post-op Pain: none  Post-op Assessment: Post-op Vital signs reviewed, Patient's Cardiovascular Status Stable, Respiratory Function Stable, Patent Airway, No signs of Nausea or vomiting and Pain level controlled  Post-op Vital Signs: Reviewed and stable  Complications: No apparent anesthesia complications

## 2012-12-26 ENCOUNTER — Inpatient Hospital Stay (HOSPITAL_COMMUNITY): Payer: Medicare Other

## 2012-12-26 LAB — BASIC METABOLIC PANEL
CO2: 23 mEq/L (ref 19–32)
GFR calc non Af Amer: 46 mL/min — ABNORMAL LOW (ref >90)
Glucose, Bld: 112 mg/dL — ABNORMAL HIGH (ref 70–99)
Potassium: 4.1 mEq/L (ref 3.5–5.1)
Sodium: 140 mEq/L (ref 135–145)

## 2012-12-26 LAB — GLUCOSE, CAPILLARY
Glucose-Capillary: 109 mg/dL — ABNORMAL HIGH (ref 70–99)
Glucose-Capillary: 111 mg/dL — ABNORMAL HIGH (ref 70–99)
Glucose-Capillary: 127 mg/dL — ABNORMAL HIGH (ref 70–99)
Glucose-Capillary: 131 mg/dL — ABNORMAL HIGH (ref 70–99)
Glucose-Capillary: 174 mg/dL — ABNORMAL HIGH (ref 70–99)
Glucose-Capillary: 102 mg/dL — ABNORMAL HIGH (ref 70–99)
Glucose-Capillary: 109 mg/dL — ABNORMAL HIGH (ref 70–99)
Glucose-Capillary: 120 mg/dL — ABNORMAL HIGH (ref 70–99)
Glucose-Capillary: 113 mg/dL — ABNORMAL HIGH (ref 70–99)
Glucose-Capillary: 118 mg/dL — ABNORMAL HIGH (ref 70–99)
Glucose-Capillary: 121 mg/dL — ABNORMAL HIGH (ref 70–99)
Glucose-Capillary: 119 mg/dL — ABNORMAL HIGH (ref 70–99)

## 2012-12-26 LAB — CBC
HCT: 26.7 % — ABNORMAL LOW (ref 36.0–46.0)
Hemoglobin: 10 g/dL — ABNORMAL LOW (ref 12.0–15.0)
Hemoglobin: 9.3 g/dL — ABNORMAL LOW (ref 12.0–15.0)
MCH: 26.5 pg (ref 26.0–34.0)
MCV: 76.1 fL — ABNORMAL LOW (ref 78.0–100.0)
Platelets: 131 10*3/uL — ABNORMAL LOW (ref 150–400)
RBC: 3.66 MIL/uL — ABNORMAL LOW (ref 3.87–5.11)
RBC: 3.51 MIL/uL — ABNORMAL LOW (ref 3.87–5.11)
WBC: 15.3 10*3/uL — ABNORMAL HIGH (ref 4.0–10.5)
WBC: 16.6 10*3/uL — ABNORMAL HIGH (ref 4.0–10.5)

## 2012-12-26 LAB — POCT I-STAT, CHEM 8
BUN: 20 mg/dL (ref 6–23)
Calcium, Ion: 1.2 mmol/L (ref 1.13–1.30)
Creatinine, Ser: 1.2 mg/dL — ABNORMAL HIGH (ref 0.50–1.10)
Glucose, Bld: 141 mg/dL — ABNORMAL HIGH (ref 70–99)
TCO2: 22 mmol/L (ref 0–100)

## 2012-12-26 LAB — CREATININE, SERUM: GFR calc Af Amer: 47 mL/min — ABNORMAL LOW (ref >90)

## 2012-12-26 MED ORDER — INSULIN ASPART 100 UNIT/ML ~~LOC~~ SOLN
4.0000 [IU] | Freq: Three times a day (TID) | SUBCUTANEOUS | Status: DC
  Administered 2012-12-27 (×3): 4 [IU] via SUBCUTANEOUS

## 2012-12-26 MED ORDER — POTASSIUM CHLORIDE 10 MEQ/50ML IV SOLN
10.0000 meq | INTRAVENOUS | Status: AC
  Administered 2012-12-26 (×2): 10 meq via INTRAVENOUS
  Filled 2012-12-26: qty 100

## 2012-12-26 MED ORDER — FUROSEMIDE 10 MG/ML IJ SOLN
40.0000 mg | Freq: Once | INTRAMUSCULAR | Status: AC
  Administered 2012-12-26: 40 mg via INTRAVENOUS
  Filled 2012-12-26: qty 4

## 2012-12-26 MED ORDER — SODIUM CHLORIDE 0.9 % IV SOLN
INTRAVENOUS | Status: AC
  Filled 2012-12-26: qty 1

## 2012-12-26 MED ORDER — INSULIN ASPART 100 UNIT/ML ~~LOC~~ SOLN
0.0000 [IU] | SUBCUTANEOUS | Status: DC
  Administered 2012-12-26 – 2012-12-27 (×3): 2 [IU] via SUBCUTANEOUS

## 2012-12-26 NOTE — Progress Notes (Signed)
1 Day Post-Op Procedure(s) (LRB): AORTIC VALVE REPLACEMENT (AVR) (N/A) INTRAOPERATIVE TRANSESOPHAGEAL ECHOCARDIOGRAM (N/A) Subjective: C/o incisional pain  Objective: Vital signs in last 24 hours: Temp:  [96.4 F (35.8 C)-98.4 F (36.9 C)] 97.9 F (36.6 C) (04/12 0730) Pulse Rate:  [75-90] 81 (04/12 0730) Cardiac Rhythm:  [-] Normal sinus rhythm (04/12 0730) Resp:  [8-32] 21 (04/12 0730) BP: (101-142)/(55-76) 112/67 mmHg (04/12 0700) SpO2:  [93 %-100 %] 93 % (04/12 0730) Arterial Line BP: (104-149)/(47-69) 133/59 mmHg (04/12 0730) FiO2 (%):  [40 %-50 %] 40 % (04/11 1652) Weight:  [158 lb 11.7 oz (72 kg)-170 lb 6.7 oz (77.3 kg)] 170 lb 6.7 oz (77.3 kg) (04/12 0500)  Hemodynamic parameters for last 24 hours: PAP: (25-50)/(11-30) 49/17 mmHg CO:  [2.9 L/min-5.4 L/min] 4.7 L/min CI:  [1.7 L/min/m2-3.1 L/min/m2] 2.7 L/min/m2  Intake/Output from previous day: 04/11 0701 - 04/12 0700 In: 4789.8 [I.V.:3619.8; Blood:220; IV Piggyback:950] Out: 3705 [Urine:2265; Blood:1100; Chest Tube:340] Intake/Output this shift:    General appearance: alert and mild distress Neurologic: intact Heart: regular rate and rhythm Lungs: diminished breath sounds bibasilar  Lab Results:  Recent Labs  12/25/12 1725 12/26/12 0435  WBC 14.5* 15.3*  HGB 10.9*  11.2* 10.0*  HCT 30.6*  33.0* 27.8*  PLT 171 149*   BMET:  Recent Labs  12/25/12 1725 12/26/12 0435  NA 139 140  K 4.3 4.1  CL 109 107  CO2  --  23  GLUCOSE 134* 112*  BUN 16 19  CREATININE 1.09  0.90 1.11*  CALCIUM  --  8.3*    PT/INR:  Recent Labs  12/25/12 1225  LABPROT 17.0*  INR 1.42   ABG    Component Value Date/Time   PHART 7.298* 12/25/2012 1837   HCO3 19.0* 12/25/2012 1837   TCO2 20 12/25/2012 1837   ACIDBASEDEF 7.0* 12/25/2012 1837   O2SAT 97.0 12/25/2012 1837   CBG (last 3)   Recent Labs  12/26/12 0659 12/26/12 0801 12/26/12 0802  GLUCAP 120* 24* 71    Assessment/Plan: S/P Procedure(s)  (LRB): AORTIC VALVE REPLACEMENT (AVR) (N/A) INTRAOPERATIVE TRANSESOPHAGEAL ECHOCARDIOGRAM (N/A) POD # 1 AVR CV- good index. Hypertensive- increase lopressor  RESP- pulmonary hygiene  RENAL- diurese, creatinine and lytes OK  CBG down this AM- transition to SSI, no basal insulin given hypoglycemia  Anemia secondary to ABL- mild, follow  Dc CT  OOB, ambulate   LOS: 1 day    Creighton Longley C 12/26/2012

## 2012-12-26 NOTE — Op Note (Signed)
Kelli Stone, Kelli Stone                  ACCOUNT NO.:  [id number]  MEDICAL RECORD NO.:  [id number]  LOCATION:  2315                         FACILITY:  [facility]  PHYSICIAN:  Evelene Croon, M.D.     DATE OF BIRTH:  1930/12/03  DATE OF PROCEDURE:  12/25/2012 DATE OF DISCHARGE:                              OPERATIVE REPORT   PREOPERATIVE DIAGNOSIS:  Severe aortic stenosis.  POSTOPERATIVE DIAGNOSIS:  Severe aortic stenosis.  OPERATIVE PROCEDURE:  Median sternotomy, extracorporeal circulation, aortic valve replacement using a 21 mm Edwards pericardial Magna Ease valve.  ATTENDING SURGEON:  Evelene Croon, M.D.  ASSISTANT:  Rowe Clack, P.A.-C.  ANESTHESIA:  General endotracheal.  CLINICAL HISTORY:  This patient is a 79 year old fairly active woman with a history of moderate aortic stenosis by echocardiogram in May 2012.  She presented to [facility] over the last couple months with dizziness that occurred while showering and while sitting at a table eating.  She also had some exertional shortness of breath while working around the house.  She also reported some feeling of fullness in her chest with activity.  A 2D echocardiogram on November 24, 2012, showed an aortic valve velocity of 4.9 m/sec with a mean gradient of 58 and a valve area 0.65 cm2.  The cardiac catheterization mean gradient was only 15.5 with a valve area of 1.3 with a cardiac index of 2.66 with normal right and left heart pressures.  There were no significant coronary stenoses.  Left ventricular function was normal with no significant gradient across the left ventricular outflow tract.  She underwent a TEE due to the discrepancy in the data from her 2D echo in categorization and this showed calcified valve leaflets with poor mobility with a mean valve gradient of 38 and a valve area 0.79 cm2.  It was felt that she did have severe aortic stenosis and Cardiology decided to have her go home and follow her up in the office.   She continued to have some symptoms and therefore was returned to my office for evaluation.  I felt that we should proceed ahead with aortic valve replacement.  I discussed the operative procedure with the patient and her family including alternatives, benefits, and risks including, but not limited to bleeding, blood transfusion, infection, stroke, myocardial infarction, heart block requiring permanent pacemaker, organ dysfunction, and death. She understood and agreed to proceed.  DESCRIPTION OF PROCEDURE:  The patient was taken to the operating room and placed on the table in supine position.  After induction of general endotracheal anesthesia, a Foley catheter was placed in bladder using sterile technique.  Then, the chest, abdomen, and both lower extremities were prepped and draped in the usual sterile manner.  A TEE was performed by Dr. Kelli Churn.  This showed severe calcific aortic stenosis.  Left ventricular function was well preserved with moderate concentric left ventricular hypertrophy.  There was no significant mitral regurgitation.  Right heart function was normal.  Then, the chest was opened through a median sternotomy incision.  The pericardium opened midline.  Examination of the heart showed good ventricular contractility.  The ascending aorta was of normal size, did have a calcified plaque  present posteriorly and along the right lateral surface, which began in the mid ascending aorta and continued up into the aortic arch.  Then, the patient was heparinized when adequate ACT was obtained, the distal ascending aorta was cannulated using a 20- [place] aortic cannula for arterial inflow.  Venous outflow was achieved using a 2-stage venous cannula for the right atrial appendage. Antegrade cardioplegia and vent cannula was inserted in the aortic root. A left ventricular vent was placed through the right superior pulmonary vein and a retrograde cardioplegic cannula was  inserted through a pursestring suture in the right atrium and advanced in the coronary sinus.  The patient was placed on cardiopulmonary bypass.  Then, the aorta was crossclamped and 800 mL of cold blood antegrade cardioplegia was administered in the aortic root with quick arrest of the heart. Systemic hypothermia to 32 degrees centigrade and topical hypothermia with iced saline was used.  Temperature probe was placed in septum insulating pad in the pericardium.  Following initial dose of antegrade cardioplegia, I gave cold blood retrograde cardioplegia at about 20 minutes intervals to maintain myocardial temperature around 10 degrees centigrade or less.  Then, the aorta was opened transversely just above the sino-tubular junction.  Examination of the native valve showed there were 3 leaflets that were heavily calcified and completely immobile.  The right and left coronary ostia were identified and were not obstructed.  The native valve leaflets were excised.  There was mild calcification of the anulus and this was decalcified with rongeurs.  Care was taken to remove all particulate debris.  The left ventricle and aortic root were irrigated with iced saline solution.  The anulus was sized and a 21 mm Edwards pericardial Magna-Ease valve was chosen.  This had model #[id number] and serial [id number].  Then, a series of pledgeted 2-0 Ethibond horizontal mattress sutures were placed around the aortic annulus with pledgets in the subannular position.  The sutures were placed through the sewing ring and the valve lowered into place.  The suture was tied sequentially.  The valve seated nicely.  The right and left coronary ostia were again identified and were not obstructed.  The patient was rewarmed to 37 degrees centigrade.  The aortotomy was closed in 2 layers using continuous 4-0 Prolene suture with felt strips to reinforce the closure.  Then, the left side of the heart was de-aired.  We  did insufflate CO2 into the pericardial cavity throughout the procedure to minimize intracardiac air.  The head was placed in Trendelenburg position and the crossclamp removed with time 68 minutes.  There was spontaneous return of ventricular fibrillation and the patient was defibrillated into sinus rhythm.  The aortotomy appeared hemostatic. Two temporary right ventricular and right atrial pacing wire was placed and brought out through the skin.  The patient was rewarmed to 37 degrees centigrade, she was weaned from cardiopulmonary bypass on no inotropic agents.  Total bypass time was 92 minutes.  Cardiac function appeared excellent with a cardiac output of 4 L per minute.  TEE showed normal prosthetic aortic valve function with no evidence of perivalvular leak or regurgitation through the valve. There was no mitral regurgitation.  Left ventricular function was well preserved.  Then, protamine was given and the venous and aortic cannulas were removed without difficulty.  Hemostasis was achieved.  Two chest tubes were placed with tube in the posterior pericardium along the anterior mediastinum.  The sternum was then reapproximated with #6 stainless steel wires.  The fascia was closed  with continuous #1 Vicryl suture.  Subcutaneous tissue was closed with continuous 2-0 Vicryl and the skin with 3-0 Vicryl.  Vicryl subcuticular closure.  The sponge, needle, and instrument counts were correct according to the scrub nurse. Dry sterile dressing was applied over the incision and around the chest tubes, which were hooked to Pleur-Evac suction.  The patient remained hemodynamically stable and was transported to the SICU in guarded, but stable condition.    Evelene Croon, M.D.    BB/MEDQ  D:  12/25/2012  T:  12/26/2012  Job:  [id number]

## 2012-12-26 NOTE — Plan of Care (Deleted)
Problem: Phase II Progression Outcomes Goal: CBGs/Blood glucose < or equal to 120 Outcome: Progressing     

## 2012-12-26 NOTE — Progress Notes (Signed)
Stable day  Pain better after tubes out  BP 148/53  Pulse 74  Temp(Src) 98.1 F (36.7 C) (Oral)  Resp 15  Ht 5\' 3"  (1.6 m)  Wt 170 lb 6.7 oz (77.3 kg)  BMI 30.2 kg/m2  SpO2 97%   Intake/Output Summary (Last 24 hours) at 12/26/12 1823 Last data filed at 12/26/12 1600  Gross per 24 hour  Intake 1901.83 ml  Output   1975 ml  Net -73.17 ml    Continue present care

## 2012-12-26 NOTE — Plan of Care (Signed)
Problem: Phase II Progression Outcomes Goal: CBGs/Blood glucose < or equal to 120 Outcome: Progressing Off insulin drip and every 4 hour blood sugars started with SSI.

## 2012-12-27 ENCOUNTER — Inpatient Hospital Stay (HOSPITAL_COMMUNITY): Payer: Medicare Other

## 2012-12-27 LAB — BASIC METABOLIC PANEL
BUN: 19 mg/dL (ref 6–23)
CO2: 25 mEq/L (ref 19–32)
Chloride: 104 mEq/L (ref 96–112)
GFR calc Af Amer: 52 mL/min — ABNORMAL LOW (ref >90)
Glucose, Bld: 101 mg/dL — ABNORMAL HIGH (ref 70–99)
Potassium: 4.4 mEq/L (ref 3.5–5.1)

## 2012-12-27 LAB — CBC
HCT: 26.4 % — ABNORMAL LOW (ref 36.0–46.0)
Hemoglobin: 9.4 g/dL — ABNORMAL LOW (ref 12.0–15.0)
MCHC: 35.6 g/dL (ref 30.0–36.0)
MCV: 76.5 fL — ABNORMAL LOW (ref 78.0–100.0)

## 2012-12-27 LAB — GLUCOSE, CAPILLARY
Glucose-Capillary: 102 mg/dL — ABNORMAL HIGH (ref 70–99)
Glucose-Capillary: 88 mg/dL (ref 70–99)
Glucose-Capillary: 89 mg/dL (ref 70–99)
Glucose-Capillary: 95 mg/dL (ref 70–99)

## 2012-12-27 MED ORDER — POTASSIUM CHLORIDE CRYS ER 20 MEQ PO TBCR
20.0000 meq | EXTENDED_RELEASE_TABLET | Freq: Every day | ORAL | Status: DC
  Administered 2012-12-27 – 2012-12-30 (×4): 20 meq via ORAL
  Filled 2012-12-27 (×6): qty 1

## 2012-12-27 MED ORDER — LISINOPRIL 40 MG PO TABS: 40.0000 mg | ORAL_TABLET | Freq: Every day | ORAL | Status: DC

## 2012-12-27 MED ORDER — FUROSEMIDE 40 MG PO TABS
40.0000 mg | ORAL_TABLET | Freq: Every day | ORAL | Status: DC
  Administered 2012-12-27 – 2012-12-30 (×4): 40 mg via ORAL
  Filled 2012-12-27 (×6): qty 1

## 2012-12-27 MED ORDER — LISINOPRIL 20 MG PO TABS
20.0000 mg | ORAL_TABLET | Freq: Every day | ORAL | Status: DC
  Administered 2012-12-27 – 2012-12-30 (×4): 20 mg via ORAL
  Filled 2012-12-27 (×5): qty 1

## 2012-12-27 NOTE — Progress Notes (Signed)
2 Days Post-Op Procedure(s) (LRB): AORTIC VALVE REPLACEMENT (AVR) (N/A) INTRAOPERATIVE TRANSESOPHAGEAL ECHOCARDIOGRAM (N/A) Subjective: Feels better today  Objective: Vital signs in last 24 hours: Temp:  [97.8 F (36.6 C)-99.2 F (37.3 C)] 99.2 F (37.3 C) (04/13 0741) Pulse Rate:  [69-92] 72 (04/13 0700) Cardiac Rhythm:  [-] Normal sinus rhythm (04/13 0700) Resp:  [7-20] 12 (04/13 0700) BP: (113-183)/(45-77) 154/56 mmHg (04/13 0700) SpO2:  [90 %-100 %] 97 % (04/13 0700) Arterial Line BP: (126-164)/(59-74) 149/67 mmHg (04/12 1400) Weight:  [169 lb 8.5 oz (76.9 kg)] 169 lb 8.5 oz (76.9 kg) (04/13 0600)  Hemodynamic parameters for last 24 hours: PAP: (56)/(28-33) 56/28 mmHg  Intake/Output from previous day: 04/12 0701 - 04/13 0700 In: 1512.1 [P.O.:600; I.V.:662.1; IV Piggyback:250] Out: 1735 [Urine:1735] Intake/Output this shift:    General appearance: alert and no distress Neurologic: intact Heart: regular rate and rhythm Lungs: diminished breath sounds bibasilar Abdomen: normal findings: soft, non-tender  Lab Results:  Recent Labs  12/26/12 1448 12/26/12 1449 12/27/12 0356  WBC 16.6*  --  16.1*  HGB 9.3* 9.2* 9.4*  HCT 26.7* 27.0* 26.4*  PLT 131*  --  123*   BMET:  Recent Labs  12/26/12 0435  12/26/12 1449 12/27/12 0356  NA 140  --  138 137  K 4.1  --  4.4 4.4  CL 107  --  106 104  CO2 23  --   --  25  GLUCOSE 112*  --  141* 101*  BUN 19  --  20 19  CREATININE 1.11*  < > 1.20* 1.13*  CALCIUM 8.3*  --   --  8.6  < > = values in this interval not displayed.  PT/INR:  Recent Labs  12/25/12 1225  LABPROT 17.0*  INR 1.42   ABG    Component Value Date/Time   PHART 7.298* 12/25/2012 1837   HCO3 19.0* 12/25/2012 1837   TCO2 22 12/26/2012 1449   ACIDBASEDEF 7.0* 12/25/2012 1837   O2SAT 97.0 12/25/2012 1837   CBG (last 3)   Recent Labs  12/26/12 2030 12/27/12 0019 12/27/12 0400  GLUCAP 135* 88 89    Assessment/Plan: S/P Procedure(s)  (LRB): AORTIC VALVE REPLACEMENT (AVR) (N/A) INTRAOPERATIVE TRANSESOPHAGEAL ECHOCARDIOGRAM (N/A) - CV- hypertensive with relatively low HR- will restart lisinopril  RESP- bibasilar atelectasis- IS  RENAL- volume overloaded- diurese  CBG well controlled  Mobilize   LOS: 2 days    Kelli Stone C 12/27/2012

## 2012-12-27 NOTE — Progress Notes (Signed)
Quiet day  BP 144/64  Pulse 89  Temp(Src) 98.8 F (37.1 C) (Oral)  Resp 16  Ht 5\' 3"  (1.6 m)  Wt 169 lb 8.5 oz (76.9 kg)  BMI 30.04 kg/m2  SpO2 97%   Intake/Output Summary (Last 24 hours) at 12/27/12 2000 Last data filed at 12/27/12 1900  Gross per 24 hour  Intake    860 ml  Output   2275 ml  Net  -1415 ml    Can likely transfer in AM

## 2012-12-28 LAB — GLUCOSE, CAPILLARY
Glucose-Capillary: 101 mg/dL — ABNORMAL HIGH (ref 70–99)
Glucose-Capillary: 162 mg/dL — ABNORMAL HIGH (ref 70–99)
Glucose-Capillary: 24 mg/dL — CL (ref 70–99)
Glucose-Capillary: 89 mg/dL (ref 70–99)
Glucose-Capillary: 94 mg/dL (ref 70–99)

## 2012-12-28 LAB — BASIC METABOLIC PANEL
BUN: 16 mg/dL (ref 6–23)
CO2: 25 mEq/L (ref 19–32)
Calcium: 8.7 mg/dL (ref 8.4–10.5)
Chloride: 101 mEq/L (ref 96–112)
Creatinine, Ser: 1.12 mg/dL — ABNORMAL HIGH (ref 0.50–1.10)
GFR calc Af Amer: 53 mL/min — ABNORMAL LOW (ref >90)

## 2012-12-28 LAB — CBC
HCT: 28.3 % — ABNORMAL LOW (ref 36.0–46.0)
MCH: 27 pg (ref 26.0–34.0)
MCV: 76.3 fL — ABNORMAL LOW (ref 78.0–100.0)
RDW: 15.2 % (ref 11.5–15.5)
WBC: 14.5 10*3/uL — ABNORMAL HIGH (ref 4.0–10.5)

## 2012-12-28 MED ORDER — GUAIFENESIN-DM 100-10 MG/5ML PO SYRP
15.0000 mL | ORAL_SOLUTION | ORAL | Status: DC | PRN
  Administered 2012-12-30 – 2012-12-31 (×2): 15 mL via ORAL
  Filled 2012-12-28 (×2): qty 15

## 2012-12-28 MED ORDER — SODIUM CHLORIDE 0.9 % IJ SOLN
3.0000 mL | Freq: Two times a day (BID) | INTRAMUSCULAR | Status: DC
  Administered 2012-12-28 – 2012-12-30 (×5): 3 mL via INTRAVENOUS

## 2012-12-28 MED ORDER — ALUM & MAG HYDROXIDE-SIMETH 200-200-20 MG/5ML PO SUSP: 15.0000 mL | ORAL | Status: DC | PRN

## 2012-12-28 MED ORDER — ZOLPIDEM TARTRATE 5 MG PO TABS: 5.0000 mg | ORAL_TABLET | Freq: Every evening | ORAL | Status: DC | PRN

## 2012-12-28 MED ORDER — SODIUM CHLORIDE 0.9 % IJ SOLN: 3.0000 mL | INTRAMUSCULAR | Status: DC | PRN

## 2012-12-28 MED ORDER — INSULIN ASPART 100 UNIT/ML ~~LOC~~ SOLN
0.0000 [IU] | Freq: Three times a day (TID) | SUBCUTANEOUS | Status: DC
  Administered 2012-12-28: 2 [IU] via SUBCUTANEOUS

## 2012-12-28 MED ORDER — SODIUM CHLORIDE 0.9 % IV SOLN: 250.0000 mL | INTRAVENOUS | Status: DC | PRN

## 2012-12-28 MED ORDER — MAGNESIUM HYDROXIDE 400 MG/5ML PO SUSP: 30.0000 mL | Freq: Every day | ORAL | Status: DC | PRN

## 2012-12-28 MED ORDER — MOVING RIGHT ALONG BOOK
Freq: Once | Status: AC
  Administered 2012-12-28: 16:00:00
  Filled 2012-12-28: qty 1

## 2012-12-28 MED FILL — Potassium Chloride Inj 2 mEq/ML: INTRAVENOUS | Qty: 40 | Status: AC

## 2012-12-28 MED FILL — Magnesium Sulfate Inj 50%: INTRAMUSCULAR | Qty: 10 | Status: AC

## 2012-12-28 NOTE — Progress Notes (Signed)
3 Days Post-Op Procedure(s) (LRB): AORTIC VALVE REPLACEMENT (AVR) (N/A) INTRAOPERATIVE TRANSESOPHAGEAL ECHOCARDIOGRAM (N/A) Subjective: No complaints this AM  Objective: Vital signs in last 24 hours: Temp:  [98.1 F (36.7 C)-99.1 F (37.3 C)] 98.1 F (36.7 C) (04/14 0733) Pulse Rate:  [77-97] 89 (04/14 0700) Cardiac Rhythm:  [-] Normal sinus rhythm (04/14 0740) Resp:  [11-24] 15 (04/14 0700) BP: (124-165)/(51-78) 146/71 mmHg (04/14 0700) SpO2:  [97 %-100 %] 98 % (04/14 0700) Weight:  [167 lb 1.7 oz (75.8 kg)] 167 lb 1.7 oz (75.8 kg) (04/14 0400)  Hemodynamic parameters for last 24 hours:    Intake/Output from previous day: 04/13 0701 - 04/14 0700 In: 520 [P.O.:360; I.V.:160] Out: 2575 [Urine:2575] Intake/Output this shift:    General appearance: alert and no distress Neurologic: intact Heart: regular rate and rhythm Lungs: diminished breath sounds bibasilar  Lab Results:  Recent Labs  12/27/12 0356 12/28/12 0508  WBC 16.1* 14.5*  HGB 9.4* 10.0*  HCT 26.4* 28.3*  PLT 123* 129*   BMET:  Recent Labs  12/27/12 0356 12/28/12 0508  NA 137 135  K 4.4 3.7  CL 104 101  CO2 25 25  GLUCOSE 101* 102*  BUN 19 16  CREATININE 1.13* 1.12*  CALCIUM 8.6 8.7    PT/INR:  Recent Labs  12/25/12 1225  LABPROT 17.0*  INR 1.42   ABG    Component Value Date/Time   PHART 7.298* 12/25/2012 1837   HCO3 19.0* 12/25/2012 1837   TCO2 22 12/26/2012 1449   ACIDBASEDEF 7.0* 12/25/2012 1837   O2SAT 97.0 12/25/2012 1837   CBG (last 3)   Recent Labs  12/27/12 2001 12/28/12 0019 12/28/12 0430  GLUCAP 102* 94 88    Assessment/Plan: S/P Procedure(s) (LRB): AORTIC VALVE REPLACEMENT (AVR) (N/A) INTRAOPERATIVE TRANSESOPHAGEAL ECHOCARDIOGRAM (N/A) Plan for transfer to step-down: see transfer orders Doing well  Transfer to 2000  Continue ambulation, diuresis   LOS: 3 days    Beauty Pless C 12/28/2012

## 2012-12-28 NOTE — Progress Notes (Signed)
CARDIAC REHAB PHASE I   PRE:  Rate/Rhythm: SR 91   BP:  Supine:  Sitting: 142/70  Standing:    SaO2:98 RA  MODE:  Ambulation: 250 ft   POST:  Rate/Rhythm: 101  BP:  Supine:  Sitting: 122/60  Standing:    SaO2:  100 RA Pt assisted up to [facility] to void.  Pt up to ambulate in the hallway X 1 assist and RW.  Pt moves slowly but steady.  Pt to chair with family in the room, call bell in place.  Encourage pt to use Incentive spirometer - demonstrated approp.  Pt denies any needs Tonna Boehringer, BSN

## 2012-12-29 ENCOUNTER — Encounter (HOSPITAL_COMMUNITY): Payer: Self-pay | Admitting: Surgery

## 2012-12-29 LAB — TYPE AND SCREEN
Unit division: 0
Unit division: 0

## 2012-12-29 LAB — BASIC METABOLIC PANEL
Calcium: 8.8 mg/dL (ref 8.4–10.5)
Creatinine, Ser: 1.02 mg/dL (ref 0.50–1.10)
GFR calc Af Amer: 59 mL/min — ABNORMAL LOW (ref >90)
GFR calc non Af Amer: 51 mL/min — ABNORMAL LOW (ref >90)

## 2012-12-29 LAB — CBC
MCH: 27.4 pg (ref 26.0–34.0)
MCV: 75.5 fL — ABNORMAL LOW (ref 78.0–100.0)
Platelets: 162 10*3/uL (ref 150–400)
RDW: 15.7 % — ABNORMAL HIGH (ref 11.5–15.5)

## 2012-12-29 LAB — GLUCOSE, CAPILLARY: Glucose-Capillary: 110 mg/dL — ABNORMAL HIGH (ref 70–99)

## 2012-12-29 MED ORDER — AMIODARONE HCL IN DEXTROSE 360-4.14 MG/200ML-% IV SOLN
60.0000 mg/h | INTRAVENOUS | Status: AC
  Administered 2012-12-29 (×2): 60 mg/h via INTRAVENOUS
  Filled 2012-12-29 (×2): qty 200

## 2012-12-29 MED ORDER — AMIODARONE HCL IN DEXTROSE 360-4.14 MG/200ML-% IV SOLN
30.0000 mg/h | INTRAVENOUS | Status: DC
  Administered 2012-12-29 – 2012-12-30 (×3): 30 mg/h via INTRAVENOUS
  Filled 2012-12-29 (×6): qty 200

## 2012-12-29 MED ORDER — AMIODARONE LOAD VIA INFUSION
150.0000 mg | Freq: Once | INTRAVENOUS | Status: AC
  Administered 2012-12-29: 150 mg via INTRAVENOUS
  Filled 2012-12-29: qty 83.34

## 2012-12-29 NOTE — Progress Notes (Signed)
Nurse is contacted by central monitoring concerning patient's HR. The patient's HR was in the 160's. Central monitoring informs the nurse that they were unsure as to what rhythm the patient is in. The nurse checks on the patient who stated that she feels her heart racing. The nurse decides to obtain an EKG which showed the patient's rhythm to be A-flutter with variable AV Block. The nurse contacts Dr. Tyrone Sage. Dr Tyrone Sage instructs the nurse to place the patient on an Amiodarone drip. To start the standard order of Amio. The nurse performed as was instructed. Harmon Pier

## 2012-12-29 NOTE — Progress Notes (Signed)
CARDIAC REHAB PHASE I   PRE:  Rate/Rhythm: 78 SR  BP:  Supine:   Sitting: 112/74  Standing:    SaO2: 98 RA  MODE:  Ambulation: 150 ft   POST:  Rate/Rhythm: 87 SR  BP:  Supine:   Sitting: 120/76  Standing:    SaO2: 92 RA 1500-1530 This was my fourth attempt to get pt to ambulate today. She has c/o of nausea and stomach cramps, also diarrhea. Assisted X 1 and used walker to ambulate. Gait steady with walker. Pt tires easily but staes that her SOB is improves today. Only able to get pt 150 feet, she was scared to get to far from bathroom. Pt back to recliner after walk with call light in reach.  Melina Copa RN 12/29/2012 3:26 PM

## 2012-12-29 NOTE — Progress Notes (Addendum)
[address]       [place] [address]             [phone]    4 Days Post-Op  Procedure(s) (LRB): AORTIC VALVE REPLACEMENT (AVR) (N/A) INTRAOPERATIVE TRANSESOPHAGEAL ECHOCARDIOGRAM (N/A) Subjective: Started on amiodarone gtt for rapid afib  Objective  Telemetry PAF-RVR, now in sinus  Temp:  [98 F (36.7 C)-98.3 F (36.8 C)] 98.2 F (36.8 C) (04/15 0420) Pulse Rate:  [85-150] 87 (04/15 0420) Resp:  [18-20] 18 (04/15 0420) BP: (126-151)/(58-64) 146/58 mmHg (04/15 0420) SpO2:  [97 %-100 %] 98 % (04/15 0420) FiO2 (%):  [100 %] 100 % (04/14 1116) Weight:  [160 lb 15 oz (73 kg)] 160 lb 15 oz (73 kg) (04/15 0420)   Intake/Output Summary (Last 24 hours) at 12/29/12 0736 Last data filed at 12/29/12 0413  Gross per 24 hour  Intake    243 ml  Output   1500 ml  Net  -1257 ml       General appearance: alert, cooperative and no distress Heart: regular rate and rhythm Lungs: clear to auscultation bilaterally Abdomen: benign Extremities: min edema Wound: incisions healing well  Lab Results:  Recent Labs  12/26/12 1448  12/28/12 0508 12/29/12 0500  NA  --   < > 135 140  K  --   < > 3.7 3.9  CL  --   < > 101 102  CO2  --   < > 25 26  GLUCOSE  --   < > 102* 123*  BUN  --   < > 16 13  CREATININE 1.24*  < > 1.12* 1.02  CALCIUM  --   < > 8.7 8.8  MG 2.5  --   --   --   < > = values in this interval not displayed. No results found for this basename: AST, ALT, ALKPHOS, BILITOT, PROT, ALBUMIN,  in the last 72 hours No results found for this basename: LIPASE, AMYLASE,  in the last 72 hours  Recent Labs  12/28/12 0508 12/29/12 0500  WBC 14.5* 11.5*  HGB 10.0* 10.3*  HCT 28.3* 28.4*  MCV 76.3* 75.5*  PLT 129* 162   No results found for this basename: CKTOTAL, CKMB, TROPONINI,  in the last 72 hours No components found with this basename: POCBNP,  No results found for this basename: DDIMER,  in the last 72 hours No results found for this basename:  HGBA1C,  in the last 72 hours No results found for this basename: CHOL, HDL, LDLCALC, TRIG, CHOLHDL,  in the last 72 hours No results found for this basename: TSH, T4TOTAL, FREET3, T3FREE, THYROIDAB,  in the last 72 hours No results found for this basename: VITAMINB12, FOLATE, FERRITIN, TIBC, IRON, RETICCTPCT,  in the last 72 hours  Medications: Scheduled . acetaminophen  1,000 mg Oral Q6H   Or  . acetaminophen (TYLENOL) oral liquid 160 mg/5 mL  975 mg Per Tube Q6H  . aspirin EC  325 mg Oral Daily   Or  . aspirin  324 mg Per Tube Daily  . bisacodyl  10 mg Oral Daily   Or  . bisacodyl  10 mg Rectal Daily  . docusate sodium  200 mg Oral Daily  . furosemide  40 mg Oral Daily  . gabapentin  100 mg Oral TID  . insulin aspart  0-15 Units Subcutaneous TID WC  . latanoprost  1 drop Both Eyes QHS  . lisinopril  20 mg Oral Daily  .  metoprolol tartrate  12.5 mg Oral BID   Or  . metoprolol tartrate  12.5 mg Per Tube BID  . pantoprazole  40 mg Oral Daily  . potassium chloride  20 mEq Oral Daily  . sodium chloride  3 mL Intravenous Q12H  . timolol  1 drop Both Eyes Daily     Radiology/Studies:  No results found.  INR: Will add last result for INR, ABG once components are confirmed Will add last 4 CBG results once components are confirmed  Assessment/Plan: S/P Procedure(s) (LRB): AORTIC VALVE REPLACEMENT (AVR) (N/A) INTRAOPERATIVE TRANSESOPHAGEAL ECHOCARDIOGRAM (N/A) 1 atrial fibrillation, on IV amio for now 2 cont gentle diuresis 3 routine pulm toilet/rehab 4 labs stable 5 bp elev at times , monitor on current rx, may need higher ACEI dose     LOS: 4 days    GOLD,[name] 4/15/20147:36 AM  Back in sinus rhythm  Convert to po Cordarone in am I have seen and examined Kelli Stone and agree with the above assessment  and plan.  Delight Ovens MD Beeper [phone] Office [phone] 12/29/2012 6:13 PM

## 2012-12-29 NOTE — Progress Notes (Signed)
12/29/2012 1650 Nursing note Pt. Has c/o multiple loose bm today. This was patient's first bowel movements since surgery. Pt. States she has had similar symptoms at home prior to admission. VSS. Gershon Crane [employer] paged and made aware. No new orders at this time. Will continue to closely monitor patient.  Wyndell Cardiff, Blanchard Kelch

## 2012-12-30 LAB — GLUCOSE, CAPILLARY
Glucose-Capillary: 127 mg/dL — ABNORMAL HIGH (ref 70–99)
Glucose-Capillary: 120 mg/dL — ABNORMAL HIGH (ref 70–99)
Glucose-Capillary: 95 mg/dL (ref 70–99)

## 2012-12-30 MED ORDER — AMIODARONE HCL 200 MG PO TABS
400.0000 mg | ORAL_TABLET | Freq: Two times a day (BID) | ORAL | Status: DC
  Administered 2012-12-30 (×2): 400 mg via ORAL
  Filled 2012-12-30 (×4): qty 2

## 2012-12-30 MED ORDER — AMIODARONE HCL IN DEXTROSE 360-4.14 MG/200ML-% IV SOLN
30.0000 mg/h | INTRAVENOUS | Status: DC
  Filled 2012-12-30 (×4): qty 200

## 2012-12-30 NOTE — Progress Notes (Addendum)
[address]       [place] [address]             [phone]    5 Days Post-Op  Procedure(s) (LRB): AORTIC VALVE REPLACEMENT (AVR) (N/A) INTRAOPERATIVE TRANSESOPHAGEAL ECHOCARDIOGRAM (N/A) Subjective: Feels well  Objective  Telemetry sinus, some pac's  Temp:  [98.1 F (36.7 C)-99 F (37.2 C)] 98.3 F (36.8 C) (04/16 0357) Pulse Rate:  [67-91] 67 (04/16 0357) Resp:  [18] 18 (04/16 0357) BP: (107-150)/(50-99) 142/99 mmHg (04/16 0357) SpO2:  [94 %-100 %] 100 % (04/16 0357) Weight:  [157 lb (71.215 kg)] 157 lb (71.215 kg) (04/16 0706)   Intake/Output Summary (Last 24 hours) at 12/30/12 0901 Last data filed at 12/29/12 2116  Gross per 24 hour  Intake    243 ml  Output    300 ml  Net    -57 ml       General appearance: alert, cooperative and no distress Heart: regular rate and rhythm Lungs: clear to auscultation bilaterally Abdomen: benign Extremities: minedema Wound: incisions healing well  Lab Results:  Recent Labs  12/28/12 0508 12/29/12 0500  NA 135 140  K 3.7 3.9  CL 101 102  CO2 25 26  GLUCOSE 102* 123*  BUN 16 13  CREATININE 1.12* 1.02  CALCIUM 8.7 8.8   No results found for this basename: AST, ALT, ALKPHOS, BILITOT, PROT, ALBUMIN,  in the last 72 hours No results found for this basename: LIPASE, AMYLASE,  in the last 72 hours  Recent Labs  12/28/12 0508 12/29/12 0500  WBC 14.5* 11.5*  HGB 10.0* 10.3*  HCT 28.3* 28.4*  MCV 76.3* 75.5*  PLT 129* 162   No results found for this basename: CKTOTAL, CKMB, TROPONINI,  in the last 72 hours No components found with this basename: POCBNP,  No results found for this basename: DDIMER,  in the last 72 hours No results found for this basename: HGBA1C,  in the last 72 hours No results found for this basename: CHOL, HDL, LDLCALC, TRIG, CHOLHDL,  in the last 72 hours No results found for this basename: TSH, T4TOTAL, FREET3, T3FREE, THYROIDAB,  in the last 72 hours No results found for  this basename: VITAMINB12, FOLATE, FERRITIN, TIBC, IRON, RETICCTPCT,  in the last 72 hours  Medications: Scheduled . acetaminophen  1,000 mg Oral Q6H   Or  . acetaminophen (TYLENOL) oral liquid 160 mg/5 mL  975 mg Per Tube Q6H  . aspirin EC  325 mg Oral Daily   Or  . aspirin  324 mg Per Tube Daily  . bisacodyl  10 mg Oral Daily   Or  . bisacodyl  10 mg Rectal Daily  . docusate sodium  200 mg Oral Daily  . furosemide  40 mg Oral Daily  . gabapentin  100 mg Oral TID  . insulin aspart  0-15 Units Subcutaneous TID WC  . latanoprost  1 drop Both Eyes QHS  . lisinopril  20 mg Oral Daily  . metoprolol tartrate  12.5 mg Oral BID   Or  . metoprolol tartrate  12.5 mg Per Tube BID  . pantoprazole  40 mg Oral Daily  . potassium chloride  20 mEq Oral Daily  . sodium chloride  3 mL Intravenous Q12H  . timolol  1 drop Both Eyes Daily     Radiology/Studies:  No results found.  INR: Will add last result for INR, ABG once components are confirmed Will add last 4 CBG results once components  are confirmed  Assessment/Plan: S/P Procedure(s) (LRB): AORTIC VALVE REPLACEMENT (AVR) (N/A) INTRAOPERATIVE TRANSESOPHAGEAL ECHOCARDIOGRAM (N/A)  1. Doing well, change to po amio 2 poss d/c soon    LOS: 5 days    GOLD,Kelli [place]  Holding sinus  Change to po cordarone Poss home in [place] I have seen and examined Kelli Stone and agree with the above assessment  and plan.  Delight Ovens MD Beeper [phone] Office [phone] 12/30/2012 1:22 PM

## 2012-12-30 NOTE — Care Management Note (Unsigned)
    Page 1 of 2   12/30/2012     4:28:31 PM   CARE MANAGEMENT NOTE 12/30/2012  Patient:  Kelli Stone, Kelli Stone   Account Number:  [id number]  Date Initiated:  12/25/2012  Documentation initiated by:  Avie Arenas  Subjective/Objective Assessment:   post AVR -  Has spouse  Independent prior to admission     Action/Plan:   Anticipated DC Date:  12/29/2012   Anticipated DC Plan:  HOME W HOME HEALTH SERVICES      DC Planning Services  CM consult      Choice offered to / List presented to:     DME arranged  Levan Hurst      DME agency  [facility]        Status of service:  In process, will continue to follow Medicare Important Message given?   (If response is "NO", the following Medicare IM given date fields will be blank) Date Medicare IM given:   Date Additional Medicare IM given:    Discharge Disposition:    Per UR Regulation:  Reviewed for med. necessity/level of care/duration of stay  If discussed at Long Length of Stay Meetings, dates discussed:    Comments:  ContactAlexiya, Franqui [phone]   [phone]                 Georgiann Mccoy Niece [phone]   12/30/12 Hamna Asa,RN,BSN [phone] MET WITH PT AND FAMILY TO DISCUSS DC PLANS.  HUSBAND AND SON TO PROVIDE 24HR CARE AT DC.  PT REQUESTS RW FOR HOME. REFERRAL TO [facility] FOR DME NEEDS.  12/29/12 Gwenlyn Hottinger,RN,BSN [phone] POST OP AFIB LAST PM ON AMIO DRIP.

## 2012-12-30 NOTE — Discharge Summary (Signed)
[address]            [place] [address]          [phone]         Discharge Summary  Name: Kelli Stone DOB: 05-26-1931 79 y.o. MRN: [id number]   Admission Date: 12/25/2012 Discharge Date:     Admitting Diagnosis: Severe aortic stenosis   Discharge Diagnosis:  Severe aortic stenosis Postoperative atrial fibrillation Expected postoperative blood loss anemia  Past Medical History  Diagnosis Date  . Glaucoma(365)   . GERD (gastroesophageal reflux disease)   . Personal history of tobacco use, presenting hazards to health   . Personal history of allergy to penicillin   . Personal history of allergy to sulfonamides   . Vertigo     HISTORY OF  . Diabetes mellitus     diet controlled  . HTN (hypertension)   . Dyslipidemia   . Aortic stenosis     Severe, echo, TEE, cardiac catheterizationn, surgical consultation, Dr Laneta Simmers, March, 2014  . Carotid artery disease     Doppler, March, 2014, no significant stenoses.  Marland Kitchen CAD (coronary artery disease)     Mild, catheterization, March, 2014, 30 and 40% lesions, nonobstructive, no further evaluation  . Ejection fraction     EF 60%, echo, March, 2014,  . Dyspnea     Exertional dyspnea March, 2014, felt secondary to aortic stenosis  . Arthritis      Procedures: AORTIC VALVE REPLACEMENT (21 mm [facility] Ease pericardial tissue valve) - 12/25/2012   HPI:  The patient is a 79 y.o. female with a known history of moderate aortic stenosis. A 2D echo in 01/2011 showed a velocity of 351 m/sec and mean gradient of 25. She recently presented to [facility] with dizziness that was occuring in the shower and while sitting at the table eating. She says that in the shower she had to lean up in the corner of the shower so she wouldn't fall. When she was sitting at the table, she got so dizzy that her hands started shaking. She has also had some exertional shortness of breath while working  around the house. She also reports some feeling of fullness in the chest with activity that passes with rest. A 2D echo on 11/24/12 showed an aortic valve velocity of 4.9 m/sec with a mean gradient of 58 mm Hg and a valve area of 0.65 cm2. Cardiac cath mean gradient was only 15.5 with AVA = 1.3 with CI = 2.66 with normal right and left heart pressures. There was no significant coronary stenosis. LV function was normal with no significant gradient across the LVOT. TEE was performed and showed calcified valve leaflets with poor mobility. The mean gradient was 38mm Hg with AVA 0.79 cm2. She was referred to Dr. Laneta Simmers for consideration of surgical intervention.  He recommended proceeding with aortic valve replacement at this time.  All risks, benefits and alternatives of surgery were explained in detail, and the patient agreed to proceed.     Hospital Course:  The patient was admitted to [facility] on 12/25/2012.  The patient was taken to the operating room and underwent the above procedure.    The postoperative course was notable for atrial fibrillation, which developed on postop day 4.  She was started on IV Amiodarone, and converted to sinus rhythm.  She has been switched to po Amiodarone  and has had no further arrhythmias.  Her blood pressure has been elevated at times, and she has been started on a beta blocker and an ACE-inhibitor. She has otherwise progressed well.  She is ambulating in the halls and tolerating a regular diet.  She has remained afebrile, and vital signs are stable.  She has been evaluated on today's date and is ready for discharge home.    Recent vital signs:  Filed Vitals:   12/30/12 0928  BP: 129/49  Pulse: 81  Temp:   Resp:     Recent laboratory studies:  CBC: Recent Labs  12/28/12 0508 12/29/12 0500  WBC 14.5* 11.5*  HGB 10.0* 10.3*  HCT 28.3* 28.4*  PLT 129* 162   BMET:  Recent Labs  12/28/12 0508 12/29/12 0500  NA 135 140  K 3.7 3.9  CL 101 102  CO2 25 26   GLUCOSE 102* 123*  BUN 16 13  CREATININE 1.12* 1.02  CALCIUM 8.7 8.8    PT/INR: No results found for this basename: LABPROT, INR,  in the last 72 hours   Discharge Medications:     Medication List    STOP taking these medications       amLODipine 10 MG tablet  Commonly known as:  NORVASC      TAKE these medications       amiodarone 400 MG tablet  Commonly known as:  PACERONE  Take 1 tablet (400 mg total) by mouth 2 (two) times daily. For 1 week, then 400 mg po daily     aspirin 325 MG EC tablet  Take 1 tablet (325 mg total) by mouth daily.     gabapentin 100 MG capsule  Commonly known as:  NEURONTIN  Take 1 capsule (100 mg total) by mouth 3 (three) times daily.     guaiFENesin-dextromethorphan 100-10 MG/5ML syrup  Commonly known as:  ROBITUSSIN DM  Take 15 mLs by mouth every 4 (four) hours as needed for cough.     HYDROcodone-acetaminophen 7.5-325 MG per tablet  Commonly known as:  NORCO  Take 1 tablet by mouth every 4 (four) hours as needed for pain.     lactase 3000 UNITS tablet  Commonly known as:  LACTAID  Take 1 tablet by mouth 3 (three) times daily with meals.     latanoprost 0.005 % ophthalmic solution  Commonly known as:  XALATAN  Place 1 drop into both eyes at bedtime.     lisinopril 40 MG tablet  Commonly known as:  PRINIVIL,ZESTRIL  Take 40 mg by mouth daily.     metoprolol tartrate 12.5 mg Tabs  Commonly known as:  LOPRESSOR  Take 0.5 tablets (12.5 mg total) by mouth 2 (two) times daily.     pantoprazole 40 MG tablet  Commonly known as:  PROTONIX  Take 40 mg by mouth daily.     timolol 0.5 % ophthalmic gel-forming  Commonly known as:  TIMOPTIC-XR  Place 1 drop into both eyes daily.         Discharge Instructions:  The patient is to refrain from driving, heavy lifting or strenuous activity.  May shower daily and clean incisions with soap and water.  May resume regular diet.   Follow Up: Follow-up Information   Follow up with  Alleen Borne, MD On 01/20/2013. (Have a chest x-ray at 12:00 at [facility] (1st floor), then see MD at 1:00)    Contact information:   [address] [place] [address] [phone]  Follow up with Willa Rough, MD. Schedule an appointment as soon as possible for a visit in 2 weeks.   Contact information:   [address] [place] [address] [phone]       Adella Hare 12/30/2012, 9:33 AM

## 2012-12-30 NOTE — Progress Notes (Signed)
CARDIAC REHAB PHASE I   PRE:  Rate/Rhythm: 68 SR  BP:  Supine:   Sitting: 140/64  Standing:    SaO2: 92 RA  MODE:  Ambulation: 350 ft   POST:  Rate/Rhythm: 79 SR  BP:  Supine:   Sitting: 130/70  Standing:    SaO2: 100 RA 1110-1135 Assisted X 1 and used walker to ambulate. Gait steady with walker. Pt states that her stomach feels better today. VS stable. Pt able to walk 350 feet with faster pace today. Pt back to recliner after walk with call light in reach.  Melina Copa RN 12/30/2012 11:34 AM

## 2012-12-31 LAB — GLUCOSE, CAPILLARY: Glucose-Capillary: 111 mg/dL — ABNORMAL HIGH (ref 70–99)

## 2012-12-31 MED ORDER — METOPROLOL TARTRATE 12.5 MG HALF TABLET: 12.5000 mg | ORAL_TABLET | Freq: Two times a day (BID) | ORAL | Status: DC

## 2012-12-31 MED ORDER — AMIODARONE HCL 400 MG PO TABS: 400.0000 mg | ORAL_TABLET | Freq: Two times a day (BID) | ORAL | Status: DC

## 2012-12-31 MED ORDER — ASPIRIN 325 MG PO TBEC: 325.0000 mg | DELAYED_RELEASE_TABLET | Freq: Every day | ORAL | Status: DC

## 2012-12-31 MED ORDER — GUAIFENESIN-DM 100-10 MG/5ML PO SYRP: 15.0000 mL | ORAL_SOLUTION | ORAL | Status: DC | PRN

## 2012-12-31 NOTE — Progress Notes (Signed)
[id number] Education completed with pt and family. Permission to refer to EDEN Phase 2 after discussing program. Put on post op video after ed. Luetta Nutting RN BSN

## 2012-12-31 NOTE — Progress Notes (Signed)
dc'ed pacing wires pt. Tolerated well

## 2012-12-31 NOTE — Progress Notes (Signed)
Discharged to home with family office visits in place teaching done  

## 2012-12-31 NOTE — Progress Notes (Signed)
Pt. ambulated  about 300 ft,using rolling walker,on room air tolerated well. Kelli Stone Kelli Reas RN

## 2012-12-31 NOTE — Progress Notes (Signed)
[address]       [place] [address]             [phone]    6 Days Post-Op  Procedure(s) (LRB): AORTIC VALVE REPLACEMENT (AVR) (N/A) INTRAOPERATIVE TRANSESOPHAGEAL ECHOCARDIOGRAM (N/A) Subjective: Feels well  Objective  Telemetry sinus rhythm  Temp:  [98 F (36.7 C)-98.2 F (36.8 C)] 98.2 F (36.8 C) (04/17 0508) Pulse Rate:  [73-81] 78 (04/17 0508) Resp:  [18] 18 (04/17 0508) BP: (120-150)/(49-60) 133/56 mmHg (04/17 0508) SpO2:  [99 %-100 %] 99 % (04/17 0508) Weight:  [158 lb 11.7 oz (72 kg)] 158 lb 11.7 oz (72 kg) (04/17 0508)   Intake/Output Summary (Last 24 hours) at 12/31/12 0731 Last data filed at 12/31/12 0500  Gross per 24 hour  Intake    480 ml  Output    750 ml  Net   -270 ml       General appearance: alert, cooperative and no distress Heart: regular rate and rhythm, S1, S2 normal and no murmur Lungs: clear to auscultation bilaterally Abdomen: benign Extremities: no edema Wound: incisions healing well  Lab Results:  Recent Labs  12/29/12 0500  NA 140  K 3.9  CL 102  CO2 26  GLUCOSE 123*  BUN 13  CREATININE 1.02  CALCIUM 8.8   No results found for this basename: AST, ALT, ALKPHOS, BILITOT, PROT, ALBUMIN,  in the last 72 hours No results found for this basename: LIPASE, AMYLASE,  in the last 72 hours  Recent Labs  12/29/12 0500  WBC 11.5*  HGB 10.3*  HCT 28.4*  MCV 75.5*  PLT 162   No results found for this basename: CKTOTAL, CKMB, TROPONINI,  in the last 72 hours No components found with this basename: POCBNP,  No results found for this basename: DDIMER,  in the last 72 hours No results found for this basename: HGBA1C,  in the last 72 hours No results found for this basename: CHOL, HDL, LDLCALC, TRIG, CHOLHDL,  in the last 72 hours No results found for this basename: TSH, T4TOTAL, FREET3, T3FREE, THYROIDAB,  in the last 72 hours No results found for this basename: VITAMINB12, FOLATE, FERRITIN, TIBC, IRON,  RETICCTPCT,  in the last 72 hours  Medications: Scheduled . amiodarone  400 mg Oral BID  . aspirin EC  325 mg Oral Daily   Or  . aspirin  324 mg Per Tube Daily  . bisacodyl  10 mg Oral Daily   Or  . bisacodyl  10 mg Rectal Daily  . docusate sodium  200 mg Oral Daily  . furosemide  40 mg Oral Daily  . gabapentin  100 mg Oral TID  . insulin aspart  0-15 Units Subcutaneous TID WC  . latanoprost  1 drop Both Eyes QHS  . lisinopril  20 mg Oral Daily  . metoprolol tartrate  12.5 mg Oral BID   Or  . metoprolol tartrate  12.5 mg Per Tube BID  . pantoprazole  40 mg Oral Daily  . potassium chloride  20 mEq Oral Daily  . sodium chloride  3 mL Intravenous Q12H  . timolol  1 drop Both Eyes Daily     Radiology/Studies:  No results found.  INR: Will add last result for INR, ABG once components are confirmed Will add last 4 CBG results once components are confirmed  Assessment/Plan: S/P Procedure(s) (LRB): AORTIC VALVE REPLACEMENT (AVR) (N/A) INTRAOPERATIVE TRANSESOPHAGEAL ECHOCARDIOGRAM (N/A) Plan for discharge: see discharge orders d/c epw's  LOS: 6 days    GOLD,[name] 4/17/20147:31 AM

## 2013-01-14 ENCOUNTER — Encounter: Payer: Self-pay | Admitting: Cardiology

## 2013-01-14 DIAGNOSIS — Z952 Presence of prosthetic heart valve: Secondary | ICD-10-CM | POA: Insufficient documentation

## 2013-01-15 ENCOUNTER — Ambulatory Visit (INDEPENDENT_AMBULATORY_CARE_PROVIDER_SITE_OTHER): Payer: Medicare Other | Admitting: Cardiology

## 2013-01-15 ENCOUNTER — Encounter: Payer: Self-pay | Admitting: Cardiology

## 2013-01-15 VITALS — BP 154/68 | HR 63 | Ht 63.0 in | Wt 155.0 lb

## 2013-01-15 DIAGNOSIS — Z9229 Personal history of other drug therapy: Secondary | ICD-10-CM

## 2013-01-15 DIAGNOSIS — Z952 Presence of prosthetic heart valve: Secondary | ICD-10-CM

## 2013-01-15 DIAGNOSIS — I4891 Unspecified atrial fibrillation: Secondary | ICD-10-CM

## 2013-01-15 DIAGNOSIS — I350 Nonrheumatic aortic (valve) stenosis: Secondary | ICD-10-CM

## 2013-01-15 DIAGNOSIS — I2510 Atherosclerotic heart disease of native coronary artery without angina pectoris: Secondary | ICD-10-CM

## 2013-01-15 DIAGNOSIS — I359 Nonrheumatic aortic valve disorder, unspecified: Secondary | ICD-10-CM

## 2013-01-15 DIAGNOSIS — Z954 Presence of other heart-valve replacement: Secondary | ICD-10-CM

## 2013-01-15 MED ORDER — AMIODARONE HCL 200 MG PO TABS: 200.0000 mg | ORAL_TABLET | Freq: Every day | ORAL | Status: DC

## 2013-01-15 NOTE — Assessment & Plan Note (Signed)
Her amiodarone dose will be reduced today and eventually the drug will be stopped if she holds sinus rhythm.

## 2013-01-15 NOTE — Assessment & Plan Note (Signed)
She had only mild coronary disease at the time of catheterization.

## 2013-01-15 NOTE — Patient Instructions (Addendum)
Your physician recommends that you schedule a follow-up appointment in: 6-8 weeks. Your physician has recommended you make the following change in your medication: Decrease amiodarone 200 mg to daily only for 4 weeks, then stop your amiodarone. Your new directions has been sent to your pharmacy. All other medications will remain the same.

## 2013-01-15 NOTE — Assessment & Plan Note (Signed)
I'm very pleased with how well she is doing. I will see her for her overall followup.  As part of today's evaluation I spent greater than 25 minutes reviewing hospital records and with her overall care. I spent more than half of this time talking with the patient and her husband and her son and arranging her medicine followup in her care.

## 2013-01-15 NOTE — Assessment & Plan Note (Signed)
She had perioperative atrial fibrillation. She's being treated with amiodarone. Currently she is on 400 mg daily. I am reducing her to 200 mg today. We will plan to stop this in 4 weeks. An EKG will be obtained around that time.

## 2013-01-15 NOTE — Progress Notes (Signed)
HPI  The patient is seen today to followup her aortic stenosis. I had very carefully assessed her over time and referred her for aortic valve surgery. This was done beautifully by Dr. Laneta Simmers. She received a tissue valve. She did have perioperative atrial fibrillation and has been treated with amiodarone since leaving the hospital. She's doing very well. She's not having any significant shortness of breath with exercise. She has some mild chest wall discomfort after surgery. I have reviewed all of the hospital records.  Allergies  Allergen Reactions  . Shellfish Allergy Nausea Only and Other (See Comments)    Vomiting & diarrhea  . Bonine (Meclizine Hcl)     Nervous   . Lotrel (Amlodipine Besy-Benazepril Hcl)     Made legs  Ache   . Benadryl (Diphenhydramine Hcl) Other (See Comments)    nervous  . Cephalexin     Pt can't remember   . Other Other (See Comments)    GUIATUSSIN. Unknown reaction  . Sulfa Antibiotics Other (See Comments)    unknown  . Zocor (Simvastatin - High Dose)     Aching   . Celebrex (Celecoxib) Other (See Comments)    Chest discomfort  . Dilacor Xr (Diltiazem Hcl) Other (See Comments)    Didn't dissolve  . Erythromycin Nausea Only  . Lescol Other (See Comments)    aching  . Metformin And Related Diarrhea  . Methylprednisolone Acetate Itching and Other (See Comments)  . Naprosyn (Naproxen) Nausea Only  . Penicillins Other (See Comments)    sleepy  . Stadol (Butorphanol Tartrate) Other (See Comments)    numb  . Trinalin (Azatadine-Pseudoephedrine) Other (See Comments)    nervous    Current Outpatient Prescriptions  Medication Sig Dispense Refill  . amiodarone (PACERONE) 400 MG tablet Take 200 mg by mouth 2 (two) times daily.      Marland Kitchen aspirin EC 325 MG EC tablet Take 1 tablet (325 mg total) by mouth daily.  30 tablet    . gabapentin (NEURONTIN) 100 MG capsule Take 1 capsule (100 mg total) by mouth 3 (three) times daily.  90 capsule  2  .  HYDROcodone-acetaminophen (NORCO) 7.5-325 MG per tablet Take 1 tablet by mouth every 4 (four) hours as needed for pain.  60 tablet  5  . latanoprost (XALATAN) 0.005 % ophthalmic solution Place 1 drop into both eyes at bedtime.        Marland Kitchen lisinopril (PRINIVIL,ZESTRIL) 40 MG tablet Take 40 mg by mouth daily.       . metoprolol tartrate (LOPRESSOR) 12.5 mg TABS Take 0.5 tablets (12.5 mg total) by mouth 2 (two) times daily.  60 each  1  . pantoprazole (PROTONIX) 40 MG tablet Take 40 mg by mouth daily.      Marland Kitchen guaiFENesin-dextromethorphan (ROBITUSSIN DM) 100-10 MG/5ML syrup Take 15 mLs by mouth every 4 (four) hours as needed for cough.  118 mL    . lactase (LACTAID) 3000 UNITS tablet Take 1 tablet by mouth 3 (three) times daily with meals.       No current facility-administered medications for this visit.    History   Social History  . Marital Status: Married    Spouse Name: N/A    Number of Children: N/A  . Years of Education: 11th grade   Occupational History  . Not on file.   Social History Main Topics  . Smoking status: Former Smoker -- 0.20 packs/day for 15 years    Types: Cigarettes    Quit  date: 09/17/1983  . Smokeless tobacco: Never Used  . Alcohol Use: No  . Drug Use: No  . Sexually Active: No   Other Topics Concern  . Not on file   Social History Narrative  . No narrative on file    Family History  Problem Relation Age of Onset  . Cancer    . Diabetes    . Diabetes Mother   . Bone cancer Father   . Diabetes Sister   . Kidney disease Sister   . Stomach cancer Sister   . Stroke Brother   . Bone cancer Brother   . Healthy Son     Past Medical History  Diagnosis Date  . Glaucoma(365)   . GERD (gastroesophageal reflux disease)   . Personal history of tobacco use, presenting hazards to health   . Personal history of allergy to penicillin   . Personal history of allergy to sulfonamides   . Vertigo     HISTORY OF  . Diabetes mellitus     diet controlled  . HTN  (hypertension)   . Dyslipidemia   . Aortic stenosis     Severe, echo, TEE, cardiac catheterizationn, surgical consultation, Dr Laneta Simmers, March, 2014  . Carotid artery disease     Doppler, March, 2014, no significant stenoses.  Marland Kitchen CAD (coronary artery disease)     Mild, catheterization, March, 2014, 30 and 40% lesions, nonobstructive, no further evaluation  . Ejection fraction     EF 60%, echo, March, 2014,  . Dyspnea     Exertional dyspnea March, 2014, felt secondary to aortic stenosis  . Arthritis   . Status post aortic valve replacement     Pericardial tissue valve,  Dr. Laneta Simmers, December 25, 2012  . Atrial fibrillation     Perioperative atrial fibrillation, April, 2014, treated with amiodarone  . Hx of amiodarone therapy     Use for perioperative atrial fibrillation, April, 2014, the dose is being weaned over time May, 2014    Past Surgical History  Procedure Laterality Date  . Appendectomy    . Right shoulder    . Shoulder surgery    . Breast surgery  bilateral  . Vaginal hysterectomy    . Total knee arthroplasty  07/01/2011    Procedure: TOTAL KNEE ARTHROPLASTY;  Surgeon: Fuller Canada, MD;  Location: [facility] ORS;  Service: Orthopedics;  Laterality: Right;  Depuy  . Colonoscopy  05/07/2012    Procedure: COLONOSCOPY;  Surgeon: Malissa Hippo, MD;  Location: [facility] ENDO SUITE;  Service: Endoscopy;  Laterality: N/A;  200  . Tee without cardioversion N/A 11/26/2012    Procedure: TRANSESOPHAGEAL ECHOCARDIOGRAM (TEE);  Surgeon: Laurey Morale, MD;  Location: [facility] ENDOSCOPY;  Service: Cardiovascular;  Laterality: N/A;  . Cardiac catheterization    . Eye surgery Right     glaucoma  . Aortic valve replacement N/A 12/25/2012    Procedure: AORTIC VALVE REPLACEMENT (AVR);  Surgeon: Alleen Borne, MD;  Location: [facility] OR;  Service: Open Heart Surgery;  Laterality: N/A;  . Intraoperative transesophageal echocardiogram N/A 12/25/2012    Procedure: INTRAOPERATIVE TRANSESOPHAGEAL ECHOCARDIOGRAM;  Surgeon:  Alleen Borne, MD;  Location: [facility] OR;  Service: Open Heart Surgery;  Laterality: N/A;    Patient Active Problem List   Diagnosis Date Noted  . Atrial fibrillation   . Hx of amiodarone therapy   . Status post aortic valve replacement   . Dyslipidemia   . Aortic stenosis   . Carotid artery disease   . CAD (  coronary artery disease)   . Dyspnea   . Ejection fraction   . Diverticulitis of sigmoid colon 08/10/2012  . Patellar tendonitis 07/08/2012  . Anemia 04/13/2012  . Spinal stenosis 11/13/2011  . Neuropathy of leg 10/02/2011  . DVT (deep venous thrombosis) 09/19/2011  . Bursitis of shoulder, right 08/15/2011  . Difficulty in walking 08/07/2011  . S/P total knee replacement 07/15/2011  . Arthritis of knee, right 06/12/2011  . Constipated 06/12/2011  . OA (osteoarthritis) of knee 04/11/2011  . HTN (hypertension)     ROS   Patient denies fever, chills, headache, sweats, rash, change in vision, change in hearing, chest pain, cough, nausea vomiting, urinary symptoms. All other systems are reviewed and are negative.  PHYSICAL EXAM  Patient is oriented to person time and place. Affect is normal. She is here with her husband and her son today. She looks great. There is no jugulovenous distention. Lungs are clear. Respiratory effort is nonlabored. Cardiac exam reveals S1 and S2. There is a very soft outflow murmur. The abdomen is soft. Her chest lesion is healing well. There is no peripheral edema. There are no musculoskeletal deformities. There are no skin rashes.  Filed Vitals:   01/15/13 1419  BP: 154/68  Pulse: 63  Height: 5\' 3"  (1.6 m)  Weight: 155 lb (70.308 kg)   EKG is done today and reviewed by me. She has sinus rhythm. There are nonspecific ST-T wave changes.  ASSESSMENT & PLAN

## 2013-01-15 NOTE — Assessment & Plan Note (Signed)
The patient's aortic stenosis has now been treated with the replacement with a tissue aortic valve. She's doing very well.

## 2013-01-20 ENCOUNTER — Ambulatory Visit: Payer: Medicare Other | Admitting: Surgery

## 2013-01-20 ENCOUNTER — Telehealth: Payer: Self-pay | Admitting: Cardiology

## 2013-01-20 NOTE — Telephone Encounter (Signed)
Patient called back with BP and HR results. Nurse informed her that they too would be sent to MD for his review and response.

## 2013-01-20 NOTE — Telephone Encounter (Signed)
Spoke with patient and she said she forgot to inform MD during her office visit that she had experienced feelings of  lightheadedness x's 3 before recent visit and another episode of lightheadedness this past Monday. Patient stated she felt sleepy all day yesterday. No c/o chest pain. Patient did c/o of a feeling like her heart is beating fast then slow. Patient informed to monitor her BP and HR twice daily, and also if she has the feelings of lightheadedness. Patient and son both informed of these instructions. Nurse advised them to record results and let us know what they are. Nurse advised patient that she did need to proceed to ED if her symptoms got worse. Patient and son verbalized understanding of plan.

## 2013-01-20 NOTE — Telephone Encounter (Signed)
3 dizzy spells before last visit and one today that has left her SOB and not feeling right. Would like speak with nurse

## 2013-01-25 NOTE — Telephone Encounter (Signed)
Spoke with patient and she said that she is doing much better. Patient denies having lightheadedness at this time and said that she feels like it has resolved.

## 2013-01-25 NOTE — Telephone Encounter (Signed)
Please call patient today to see how she is doing.

## 2013-01-26 ENCOUNTER — Other Ambulatory Visit: Payer: Self-pay | Admitting: *Deleted

## 2013-01-26 DIAGNOSIS — I359 Nonrheumatic aortic valve disorder, unspecified: Secondary | ICD-10-CM

## 2013-01-27 ENCOUNTER — Ambulatory Visit (INDEPENDENT_AMBULATORY_CARE_PROVIDER_SITE_OTHER): Payer: Medicare Other | Admitting: Surgery

## 2013-01-27 ENCOUNTER — Ambulatory Visit
Admission: RE | Admit: 2013-01-27 | Discharge: 2013-01-27 | Disposition: A | Discharge status: Home / Self Care | Payer: Medicare Other | Source: Ambulatory Visit | Attending: Surgery | Admitting: Surgery

## 2013-01-27 ENCOUNTER — Encounter: Payer: Self-pay | Admitting: Surgery

## 2013-01-27 VITALS — BP 146/64 | HR 62 | Resp 16 | Ht 63.0 in | Wt 154.0 lb

## 2013-01-27 DIAGNOSIS — I359 Nonrheumatic aortic valve disorder, unspecified: Secondary | ICD-10-CM

## 2013-01-27 DIAGNOSIS — Z952 Presence of prosthetic heart valve: Secondary | ICD-10-CM

## 2013-01-27 DIAGNOSIS — I350 Nonrheumatic aortic (valve) stenosis: Secondary | ICD-10-CM

## 2013-01-27 DIAGNOSIS — Z954 Presence of other heart-valve replacement: Secondary | ICD-10-CM

## 2013-01-27 NOTE — Progress Notes (Signed)
    [address]       [place] [address]             [phone]     HPI:  Patient returns with her husband and son for routine postoperative follow-up having undergone aortic valve replacement using a 21 mm Edwards pericardial valve on 12/25/2012. The patient's early postoperative recovery while in the hospital was notable for postoperative atrial fibrillation. She converted to normal sinus rhythm on amiodarone. Since hospital discharge the patient reports that she did well for a week or so after she went home and then began having some episodes of dizziness and fatigue. She saw Dr. Myrtis Ser and her gabapentin was discontinued. She said that her dizziness and fatigue have completely resolved. She is walking daily without chest pain or shortness of breath. She is anxious to start cardiac rehabilitation.   Current Outpatient Prescriptions  Medication Sig Dispense Refill  . amiodarone (PACERONE) 200 MG tablet Take 200 mg by mouth daily.      Marland Kitchen aspirin EC 325 MG EC tablet Take 1 tablet (325 mg total) by mouth daily.  30 tablet    . guaiFENesin-dextromethorphan (ROBITUSSIN DM) 100-10 MG/5ML syrup Take 15 mLs by mouth every 4 (four) hours as needed for cough.  118 mL    . HYDROcodone-acetaminophen (NORCO) 7.5-325 MG per tablet Take 1 tablet by mouth every 4 (four) hours as needed for pain.  60 tablet  5  . lactase (LACTAID) 3000 UNITS tablet Take 1 tablet by mouth 3 (three) times daily with meals.      . latanoprost (XALATAN) 0.005 % ophthalmic solution Place 1 drop into both eyes at bedtime.        Marland Kitchen lisinopril (PRINIVIL,ZESTRIL) 40 MG tablet Take 40 mg by mouth daily.       . metoprolol tartrate (LOPRESSOR) 12.5 mg TABS Take 0.5 tablets (12.5 mg total) by mouth 2 (two) times daily.  60 each  1  . pantoprazole (PROTONIX) 40 MG tablet Take 40 mg by mouth daily.       No current facility-administered medications for this visit.    Physical Exam: BP 146/64  Pulse 62  Resp 16   Ht 5\' 3"  (1.6 m)  Wt 154 lb (69.854 kg)  BMI 27.29 kg/m2  SpO2 98% She looks well. Cardiac exam shows a regular rate and rhythm with normal valve sounds. Lung exam is clear. The chest incision is healing well and sternum is stable. There is no peripheral edema.  Diagnostic Tests:  Chest x-ray today shows clear lung fields and no pleural effusions.  Impression:  She is making a good recovery following aortic valve replacement surgery. I told her she could return to cooking meals and can start cardiac rehabilitation. She does not drive. I asked her not to lift anything heavier than 10 pounds for 3 months postoperatively.  Plan: She will continue to followup with Dr. Myrtis Ser and will contact me if she develops any problems her incisions.

## 2013-01-28 ENCOUNTER — Telehealth: Payer: Self-pay | Admitting: Cardiology

## 2013-01-28 MED ORDER — ASPIRIN 325 MG PO TBEC: 325.0000 mg | DELAYED_RELEASE_TABLET | Freq: Every day | ORAL | Status: DC

## 2013-01-28 NOTE — Telephone Encounter (Signed)
Patient wants to know if she is to continue taking ASA 325 mg

## 2013-02-04 ENCOUNTER — Other Ambulatory Visit: Payer: Self-pay | Admitting: *Deleted

## 2013-02-04 ENCOUNTER — Telehealth: Payer: Self-pay | Admitting: Cardiology

## 2013-02-04 DIAGNOSIS — Z952 Presence of prosthetic heart valve: Secondary | ICD-10-CM

## 2013-02-04 NOTE — Telephone Encounter (Signed)
Discussed below with patient.  States that someone from cardiac rehab called her and told her they were closing & that she had 2 choices.  [place] or [facility].  Patient states that she prefers to go to [facility].  Informed patient that message will be sent to [address] [facility]) for scheduling.  She verbalized understanding.

## 2013-02-04 NOTE — Telephone Encounter (Signed)
Patient is really concerned that she has not heard anything in reference to Rehab.  WHen it will start?

## 2013-02-10 NOTE — Telephone Encounter (Signed)
Spoke with [facility] cardiac rehab and they have contacted Kelli Stone to schedule rehab for Kelli Stone.  Called Diane Coad at [facility] Cardiac rehab she will be calling patient to set up appointment.

## 2013-02-16 ENCOUNTER — Telehealth: Payer: Self-pay | Admitting: Cardiology

## 2013-02-16 NOTE — Telephone Encounter (Signed)
Spoke with patient and she stated that she is very sore around the incision of her heart surgery. Patient told nurse that she didn't call the surgeon's office about this. Nurse advised patient to call the surgeon's office with this complaint. Patient verbalized understanding.

## 2013-02-16 NOTE — Telephone Encounter (Signed)
Had open heart surgery in May. States that she continues to have pain around the incision States that she is very sore. Please advise.

## 2013-02-18 ENCOUNTER — Encounter (HOSPITAL_COMMUNITY)
Admission: RE | Admit: 2013-02-18 | Discharge: 2013-02-18 | Disposition: A | Discharge status: Home / Self Care | Payer: Medicare Other | Source: Ambulatory Visit | Attending: Cardiology | Admitting: Cardiology

## 2013-02-18 ENCOUNTER — Telehealth: Payer: Self-pay | Admitting: *Deleted

## 2013-02-18 VITALS — BP 150/70 | HR 54 | Ht 63.0 in | Wt 152.3 lb

## 2013-02-18 DIAGNOSIS — Z5189 Encounter for other specified aftercare: Secondary | ICD-10-CM | POA: Insufficient documentation

## 2013-02-18 DIAGNOSIS — Z954 Presence of other heart-valve replacement: Secondary | ICD-10-CM | POA: Insufficient documentation

## 2013-02-18 NOTE — Patient Instructions (Signed)
Pt has finished orientation and is scheduled to start CR on 02/22/13 at 9:30. Pt has been instructed to arrive to class 15 minutes early for scheduled class. Pt has been instructed to wear comfortable clothing and shoes with rubber soles. Pt has been told to take their medications 1 hour prior to coming to class.  If the patient is not going to attend class, he/she has been instructed to call.

## 2013-02-18 NOTE — Telephone Encounter (Signed)
Received call from Cardiac Rehab department regarding patients BP that is elevated on today, and dizziness. Patient saw her PCP on yesterday and was given amlodipine to start. Nurse advised staff and patient that the norvasc is new and has not been given enough time to work. Patient also informed that she needed to go home rest, continue to monitor her BP and symptoms and if she get worse, she needed to go to ED for evaluation. Patient has up coming appointments with surgeon office and cardiology over the next 1-2 weeks. Reassurance given to cardiac rehab staff and patient.

## 2013-02-18 NOTE — Progress Notes (Addendum)
Patient has been referred to Cardiac Rehab by Dr. Myrtis Ser post Aortic Vlave Replacement V43.3. During orientation advised patient on arrival and appointment times what to wear, what to do before, during and after exercise. Reviewed attendance and class policy. Talked about inclement weather and class consultation policy. Pt is scheduled to start Cardiac Rehab on 02/22/13 at 9:30. Pt was advised to come to class 5 minutes before class starts. He was also given instructions on meeting with the dietician and attending the Family Structure classes. Pt is eager to get started.

## 2013-02-22 ENCOUNTER — Encounter (HOSPITAL_COMMUNITY)
Admission: RE | Admit: 2013-02-22 | Discharge: 2013-02-22 | Disposition: A | Discharge status: Home / Self Care | Payer: Medicare Other | Source: Ambulatory Visit | Attending: Cardiology | Admitting: Cardiology

## 2013-02-24 ENCOUNTER — Ambulatory Visit: Payer: Medicare Other | Admitting: Surgery

## 2013-02-24 ENCOUNTER — Encounter (HOSPITAL_COMMUNITY): Payer: Medicare Other

## 2013-02-26 ENCOUNTER — Encounter (HOSPITAL_COMMUNITY): Payer: Medicare Other

## 2013-03-01 ENCOUNTER — Encounter (HOSPITAL_COMMUNITY)
Admission: RE | Admit: 2013-03-01 | Discharge: 2013-03-01 | Disposition: A | Discharge status: Home / Self Care | Payer: Medicare Other | Source: Ambulatory Visit | Attending: Cardiology | Admitting: Cardiology

## 2013-03-02 ENCOUNTER — Ambulatory Visit (INDEPENDENT_AMBULATORY_CARE_PROVIDER_SITE_OTHER): Payer: Medicare Other | Admitting: Cardiology

## 2013-03-02 ENCOUNTER — Encounter: Payer: Self-pay | Admitting: Cardiology

## 2013-03-02 ENCOUNTER — Other Ambulatory Visit: Payer: Self-pay | Admitting: *Deleted

## 2013-03-02 VITALS — BP 146/76 | HR 56 | Ht 63.0 in | Wt 151.1 lb

## 2013-03-02 DIAGNOSIS — Z952 Presence of prosthetic heart valve: Secondary | ICD-10-CM

## 2013-03-02 DIAGNOSIS — R55 Syncope and collapse: Secondary | ICD-10-CM

## 2013-03-02 DIAGNOSIS — I4891 Unspecified atrial fibrillation: Secondary | ICD-10-CM

## 2013-03-02 DIAGNOSIS — R209 Unspecified disturbances of skin sensation: Secondary | ICD-10-CM

## 2013-03-02 DIAGNOSIS — R0609 Other forms of dyspnea: Secondary | ICD-10-CM

## 2013-03-02 DIAGNOSIS — R0989 Other specified symptoms and signs involving the circulatory and respiratory systems: Secondary | ICD-10-CM

## 2013-03-02 DIAGNOSIS — I10 Essential (primary) hypertension: Secondary | ICD-10-CM

## 2013-03-02 DIAGNOSIS — Z954 Presence of other heart-valve replacement: Secondary | ICD-10-CM

## 2013-03-02 DIAGNOSIS — R0600 Dyspnea, unspecified: Secondary | ICD-10-CM

## 2013-03-02 DIAGNOSIS — I2510 Atherosclerotic heart disease of native coronary artery without angina pectoris: Secondary | ICD-10-CM

## 2013-03-02 DIAGNOSIS — R208 Other disturbances of skin sensation: Secondary | ICD-10-CM | POA: Insufficient documentation

## 2013-03-02 DIAGNOSIS — D649 Anemia, unspecified: Secondary | ICD-10-CM

## 2013-03-02 DIAGNOSIS — Z9229 Personal history of other drug therapy: Secondary | ICD-10-CM

## 2013-03-02 NOTE — Assessment & Plan Note (Signed)
Blood pressure stable. No change in therapy. 

## 2013-03-02 NOTE — Assessment & Plan Note (Signed)
The patient has significant tenderness to the skin on her anterior chest. She will ask Dr. Laneta Simmers about this when she sees him.

## 2013-03-02 NOTE — Assessment & Plan Note (Signed)
The patient's amiodarone has been stopped. She's had no return of atrial fibrillation. No further workup now.

## 2013-03-02 NOTE — Progress Notes (Signed)
HPI  Patient returns today to followup her aortic stenosis after her surgery. She is also here to followup atrial fibrillation. After I saw her last we eventually stopped amiodarone. She has now been off amiodarone. She's not having any palpitations. The skin on her anterior chest is quite tender. She does have a keloid. There is no evidence of inflammation or infection. She describes spells that she's been having that are concerning. She can be sitting still and then began to feel some dizziness. She may have a mild change in vision. She feels presyncopal. There may be some spinning sensation. These spells improve fairly rapidly. She does not feel any marked palpitations.  Allergies  Allergen Reactions  . Shellfish Allergy Nausea Only and Other (See Comments)    Vomiting & diarrhea  . Bonine (Meclizine Hcl)     Nervous   . Lotrel (Amlodipine Besy-Benazepril Hcl)     Made legs  Ache   . Benadryl (Diphenhydramine Hcl) Other (See Comments)    nervous  . Cephalexin     Pt can't remember   . Other Other (See Comments)    GUIATUSSIN. Unknown reaction  . Sulfa Antibiotics Other (See Comments)    unknown  . Zocor (Simvastatin - High Dose)     Aching   . Celebrex (Celecoxib) Other (See Comments)    Chest discomfort  . Dilacor Xr (Diltiazem Hcl) Other (See Comments)    Didn't dissolve  . Erythromycin Nausea Only  . Lescol Other (See Comments)    aching  . Metformin And Related Diarrhea  . Methylprednisolone Acetate Itching and Other (See Comments)  . Naprosyn (Naproxen) Nausea Only  . Penicillins Other (See Comments)    sleepy  . Stadol (Butorphanol Tartrate) Other (See Comments)    numb  . Trinalin (Azatadine-Pseudoephedrine) Other (See Comments)    nervous    Current Outpatient Prescriptions  Medication Sig Dispense Refill  . amLODipine (NORVASC) 5 MG tablet Take 5 mg by mouth daily.      Marland Kitchen aspirin 325 MG EC tablet Take 1 tablet (325 mg total) by mouth daily.  30 tablet  6    . diazepam (VALIUM) 5 MG tablet Take 2.5 mg by mouth daily.      Marland Kitchen HYDROcodone-acetaminophen (NORCO) 7.5-325 MG per tablet Take 1 tablet by mouth every 4 (four) hours as needed for pain.  60 tablet  5  . lactase (LACTAID) 3000 UNITS tablet Take 1 tablet by mouth 3 (three) times daily with meals.      . latanoprost (XALATAN) 0.005 % ophthalmic solution Place 1 drop into both eyes at bedtime.        Marland Kitchen lisinopril (PRINIVIL,ZESTRIL) 40 MG tablet Take 40 mg by mouth daily.       . metoprolol tartrate (LOPRESSOR) 25 MG tablet Take 12.5 mg by mouth 2 (two) times daily.      . pantoprazole (PROTONIX) 40 MG tablet Take 40 mg by mouth daily.       No current facility-administered medications for this visit.    History   Social History  . Marital Status: Married    Spouse Name: N/A    Number of Children: N/A  . Years of Education: 11th grade   Occupational History  . Not on file.   Social History Main Topics  . Smoking status: Former Smoker -- 0.20 packs/day for 15 years    Types: Cigarettes    Quit date: 09/17/1983  . Smokeless tobacco: Never Used  . Alcohol Use:  No  . Drug Use: No  . Sexually Active: No   Other Topics Concern  . Not on file   Social History Narrative  . No narrative on file    Family History  Problem Relation Age of Onset  . Cancer    . Diabetes    . Diabetes Mother   . Bone cancer Father   . Diabetes Sister   . Kidney disease Sister   . Stomach cancer Sister   . Stroke Brother   . Bone cancer Brother   . Healthy Son     Past Medical History  Diagnosis Date  . Glaucoma   . GERD (gastroesophageal reflux disease)   . Personal history of tobacco use, presenting hazards to health   . Personal history of allergy to penicillin   . Personal history of allergy to sulfonamides   . Vertigo     HISTORY OF  . Diabetes mellitus     diet controlled  . HTN (hypertension)   . Dyslipidemia   . Aortic stenosis     Severe, echo, TEE, cardiac catheterizationn,  surgical consultation, Dr Laneta Simmers, March, 2014  . Carotid artery disease     Doppler, March, 2014, no significant stenoses.  Marland Kitchen CAD (coronary artery disease)     Mild, catheterization, March, 2014, 30 and 40% lesions, nonobstructive, no further evaluation  . Ejection fraction     EF 60%, echo, March, 2014,  . Dyspnea     Exertional dyspnea March, 2014, felt secondary to aortic stenosis  . Arthritis   . Status post aortic valve replacement     Pericardial tissue valve,  Dr. Laneta Simmers, December 25, 2012  . Atrial fibrillation     Perioperative atrial fibrillation, April, 2014, treated with amiodarone  . Hx of amiodarone therapy     Use for perioperative atrial fibrillation, April, 2014, the dose is being weaned over time May, 2014  . Pre-syncope     June, 2014    Past Surgical History  Procedure Laterality Date  . Appendectomy    . Right shoulder    . Shoulder surgery    . Breast surgery  bilateral  . Vaginal hysterectomy    . Total knee arthroplasty  07/01/2011    Procedure: TOTAL KNEE ARTHROPLASTY;  Surgeon: Fuller Canada, MD;  Location: [facility] ORS;  Service: Orthopedics;  Laterality: Right;  Depuy  . Colonoscopy  05/07/2012    Procedure: COLONOSCOPY;  Surgeon: Malissa Hippo, MD;  Location: [facility] ENDO SUITE;  Service: Endoscopy;  Laterality: N/A;  200  . Tee without cardioversion N/A 11/26/2012    Procedure: TRANSESOPHAGEAL ECHOCARDIOGRAM (TEE);  Surgeon: Laurey Morale, MD;  Location: [facility] ENDOSCOPY;  Service: Cardiovascular;  Laterality: N/A;  . Cardiac catheterization    . Eye surgery Right     glaucoma  . Aortic valve replacement N/A 12/25/2012    Procedure: AORTIC VALVE REPLACEMENT (AVR);  Surgeon: Alleen Borne, MD;  Location: [facility] OR;  Service: Open Heart Surgery;  Laterality: N/A;  . Intraoperative transesophageal echocardiogram N/A 12/25/2012    Procedure: INTRAOPERATIVE TRANSESOPHAGEAL ECHOCARDIOGRAM;  Surgeon: Alleen Borne, MD;  Location: [facility] OR;  Service: Open Heart Surgery;   Laterality: N/A;    Patient Active Problem List   Diagnosis Date Noted  . Pre-syncope   . Atrial fibrillation   . Hx of amiodarone therapy   . Status post aortic valve replacement   . Dyslipidemia   . Aortic stenosis   . Carotid artery disease   .  CAD (coronary artery disease)   . Dyspnea   . Ejection fraction   . Diverticulitis of sigmoid colon 08/10/2012  . Patellar tendonitis 07/08/2012  . Anemia 04/13/2012  . Spinal stenosis 11/13/2011  . Neuropathy of leg 10/02/2011  . DVT (deep venous thrombosis) 09/19/2011  . Bursitis of shoulder, right 08/15/2011  . Difficulty in walking 08/07/2011  . S/P total knee replacement 07/15/2011  . Arthritis of knee, right 06/12/2011  . Constipated 06/12/2011  . OA (osteoarthritis) of knee 04/11/2011  . HTN (hypertension)     ROS   Patient denies fever, chills, headache, sweats, rash, change in hearing, chest pain, cough, nausea vomiting, urinary symptoms. All other systems are reviewed and are negative.  PHYSICAL EXAM  Patient looks good. She is here with her husband. She is oriented to person time and place. Affect is normal. The skin on her anterior chest in the area of the keloid is quite tender. There is no obvious infection. There is no jugulovenous distention. Lungs are clear. Respiratory effort is nonlabored. Cardiac exam reveals a soft flow murmur. The abdomen is soft. There is no peripheral edema. There no musculoskeletal deformities. There are no skin rashes.  Filed Vitals:   03/02/13 1414  BP: 146/76  Pulse: 56  Height: 5\' 3"  (1.6 m)  Weight: 151 lb 1.9 oz (68.548 kg)  SpO2: 100%   EKG is done today and reviewed by me. She is maintaining sinus rhythm. Her EKG looks good with very mild nonspecific ST-T wave changes.  ASSESSMENT & PLAN

## 2013-03-02 NOTE — Assessment & Plan Note (Signed)
Amiodarone has now been stopped.

## 2013-03-02 NOTE — Assessment & Plan Note (Signed)
She's doing extremely well since her surgery in April, 2014.  As part of today's evaluation I spent greater than 25 minutes with her overall care. I spent more than half of his time working with her and her husband and talking about her multiple issues and making further plans.

## 2013-03-02 NOTE — Assessment & Plan Note (Signed)
Her exertional shortness of breath was treated with her aortic valve surgery. This is improved.

## 2013-03-02 NOTE — Patient Instructions (Addendum)
Your physician recommends that you schedule a follow-up appointment in: 8 weeks. Your physician recommends that you continue on your current medications as directed. Please refer to the Current Medication list given to you today. Your physician has recommended that you wear an event monitor. Event monitors are medical devices that record the heart's electrical activity. Doctors most often [place] these monitors to diagnose arrhythmias. Arrhythmias are problems with the speed or rhythm of the heartbeat. The monitor is a small, portable device. You can wear one while you do your normal daily activities. This is usually used to diagnose what is causing palpitations/syncope (passing out).

## 2013-03-02 NOTE — Assessment & Plan Note (Signed)
She had only minimal coronary disease at the time of her cath. She did not receive any bypass grafts.

## 2013-03-02 NOTE — Assessment & Plan Note (Signed)
The patient has been having spells of presyncope. I do not think it is from her eyes. There has been some question of vertigo. Because she's had aortic valve surgery and atrial fibrillation, she needs to wear an event recorder to be sure that she is not having episodes of AV block. This will be arranged.

## 2013-03-03 ENCOUNTER — Encounter (HOSPITAL_COMMUNITY)
Admission: RE | Admit: 2013-03-03 | Discharge: 2013-03-03 | Disposition: A | Discharge status: Home / Self Care | Payer: Medicare Other | Source: Ambulatory Visit | Attending: Cardiology | Admitting: Cardiology

## 2013-03-05 ENCOUNTER — Encounter (HOSPITAL_COMMUNITY)
Admission: RE | Admit: 2013-03-05 | Discharge: 2013-03-05 | Disposition: A | Discharge status: Home / Self Care | Payer: Medicare Other | Source: Ambulatory Visit | Attending: Cardiology | Admitting: Cardiology

## 2013-03-05 DIAGNOSIS — I4891 Unspecified atrial fibrillation: Secondary | ICD-10-CM

## 2013-03-05 DIAGNOSIS — R55 Syncope and collapse: Secondary | ICD-10-CM

## 2013-03-08 ENCOUNTER — Ambulatory Visit: Payer: Self-pay | Admitting: Surgery

## 2013-03-08 ENCOUNTER — Ambulatory Visit (INDEPENDENT_AMBULATORY_CARE_PROVIDER_SITE_OTHER): Payer: Self-pay | Admitting: Surgery

## 2013-03-08 ENCOUNTER — Encounter: Payer: Self-pay | Admitting: Surgery

## 2013-03-08 ENCOUNTER — Encounter (HOSPITAL_COMMUNITY)
Admission: RE | Admit: 2013-03-08 | Discharge: 2013-03-08 | Disposition: A | Discharge status: Home / Self Care | Payer: Medicare Other | Source: Ambulatory Visit | Attending: Cardiology | Admitting: Cardiology

## 2013-03-08 VITALS — BP 135/69 | HR 57 | Resp 16 | Ht 63.0 in | Wt 152.0 lb

## 2013-03-08 DIAGNOSIS — Z954 Presence of other heart-valve replacement: Secondary | ICD-10-CM

## 2013-03-08 DIAGNOSIS — I350 Nonrheumatic aortic (valve) stenosis: Secondary | ICD-10-CM

## 2013-03-08 DIAGNOSIS — I359 Nonrheumatic aortic valve disorder, unspecified: Secondary | ICD-10-CM

## 2013-03-08 DIAGNOSIS — Z952 Presence of prosthetic heart valve: Secondary | ICD-10-CM

## 2013-03-08 NOTE — Progress Notes (Signed)
[address]       [place] [address]             [phone]        HPI:  The patient today for followup status post aortic valve replacement using a 21 mm Edwards pericardial valve on 12/25/2012. I last saw her on 01/27/2013 for routine postoperative followup and she was doing well at that time. She was recently seen in the office by Dr. Myrtis Ser and was reporting some episodes of dizziness and visual changes that usually occur at rest while sitting and with head movement. She says it has not been associated with position change or exertion. She has noticed no palpitations. She said that she has checked her blood pressure during some of these episodes and her blood pressure has been fine. She said she has had 6 episodes in total. An event recorder was recently placed by Dr.Katz. She said that she had one episode of dizziness since the event recorder was placed and called the number that was given to her. She also reports persistent hypersensitivity on both sides of the sternal incision and she cannot stand to have her clothing rubbing on her chest. She was wondering whether there was any treatment for this. She denies any shortness of breath.  Current Outpatient Prescriptions  Medication Sig Dispense Refill  . amLODipine (NORVASC) 5 MG tablet Take 5 mg by mouth daily.      Marland Kitchen aspirin 325 MG EC tablet Take 1 tablet (325 mg total) by mouth daily.  30 tablet  6  . diazepam (VALIUM) 5 MG tablet Take 2.5 mg by mouth daily.      Marland Kitchen HYDROcodone-acetaminophen (NORCO) 7.5-325 MG per tablet Take 1 tablet by mouth every 4 (four) hours as needed for pain.  60 tablet  5  . lactase (LACTAID) 3000 UNITS tablet Take 1 tablet by mouth 3 (three) times daily with meals.      . latanoprost (XALATAN) 0.005 % ophthalmic solution Place 1 drop into both eyes at bedtime.        Marland Kitchen lisinopril (PRINIVIL,ZESTRIL) 40 MG tablet Take 40 mg by mouth daily.       . metoprolol tartrate (LOPRESSOR) 25 MG  tablet Take 12.5 mg by mouth 2 (two) times daily.      . pantoprazole (PROTONIX) 40 MG tablet Take 40 mg by mouth daily.       No current facility-administered medications for this visit.     Physical Exam: BP 135/69  Pulse 57  Resp 16  Ht 5\' 3"  (1.6 m)  Wt 152 lb (68.947 kg)  BMI 26.93 kg/m2  SpO2 99% She looks well. Cardiac exam shows a regular rate and rhythm with normal prosthetic valve sounds. Lung exam is clear. The chest incision is healing well. There is a narrow keloid present. There is no sign of infection. There is hypersensitivity of the skin on both sides of the incision.  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 This is very common and gradually improves but can take 6-12 months to completely resolve. There is no specific treatment for this and I don't think it is worth starting her on Neurontin or Lyrica due to the potential side effects in this woman who already has episodes of dizziness and lots of medication allergies. I reassured her that this will gradually resolve. The etiology of her dizziness is unclear  but hopefully the event recorder will rule out an arrhythmia as the cause. It sounds like this could be an inner ear problem and she reports having problems in the past with her ears and sinuses.  Plan:  She has a followup appointment in a few weeks with Dr. Myrtis Ser and I told her that I would be happy to see her back if the hypersensitivity of her chest wall does not progressively improve.

## 2013-03-10 ENCOUNTER — Encounter (HOSPITAL_COMMUNITY)
Admission: RE | Admit: 2013-03-10 | Discharge: 2013-03-10 | Disposition: A | Discharge status: Home / Self Care | Payer: Medicare Other | Source: Ambulatory Visit | Attending: Cardiology | Admitting: Cardiology

## 2013-03-12 ENCOUNTER — Encounter (HOSPITAL_COMMUNITY)
Admission: RE | Admit: 2013-03-12 | Discharge: 2013-03-12 | Disposition: A | Discharge status: Home / Self Care | Payer: Medicare Other | Source: Ambulatory Visit | Attending: Cardiology | Admitting: Cardiology

## 2013-03-15 ENCOUNTER — Encounter (HOSPITAL_COMMUNITY)
Admission: RE | Admit: 2013-03-15 | Discharge: 2013-03-15 | Disposition: A | Discharge status: Home / Self Care | Payer: Medicare Other | Source: Ambulatory Visit | Attending: Cardiology | Admitting: Cardiology

## 2013-03-17 ENCOUNTER — Encounter (HOSPITAL_COMMUNITY)
Admission: RE | Admit: 2013-03-17 | Discharge: 2013-03-17 | Disposition: A | Discharge status: Home / Self Care | Payer: Medicare Other | Source: Ambulatory Visit | Attending: Cardiology | Admitting: Cardiology

## 2013-03-17 DIAGNOSIS — Z5189 Encounter for other specified aftercare: Secondary | ICD-10-CM | POA: Insufficient documentation

## 2013-03-17 DIAGNOSIS — Z954 Presence of other heart-valve replacement: Secondary | ICD-10-CM | POA: Insufficient documentation

## 2013-03-19 ENCOUNTER — Encounter (HOSPITAL_COMMUNITY): Payer: Medicare Other

## 2013-03-22 ENCOUNTER — Encounter (HOSPITAL_COMMUNITY): Payer: Medicare Other

## 2013-03-24 ENCOUNTER — Encounter (HOSPITAL_COMMUNITY): Payer: Medicare Other

## 2013-03-26 ENCOUNTER — Encounter (HOSPITAL_COMMUNITY): Payer: Medicare Other

## 2013-03-29 ENCOUNTER — Encounter (HOSPITAL_COMMUNITY): Payer: Medicare Other

## 2013-03-31 ENCOUNTER — Encounter (HOSPITAL_COMMUNITY)
Admission: RE | Admit: 2013-03-31 | Discharge: 2013-03-31 | Disposition: A | Discharge status: Home / Self Care | Payer: Medicare Other | Source: Ambulatory Visit | Attending: Cardiology | Admitting: Cardiology

## 2013-04-02 ENCOUNTER — Encounter (HOSPITAL_COMMUNITY)
Admission: RE | Admit: 2013-04-02 | Discharge: 2013-04-02 | Disposition: A | Discharge status: Home / Self Care | Payer: Medicare Other | Source: Ambulatory Visit | Attending: Cardiology | Admitting: Cardiology

## 2013-04-05 ENCOUNTER — Encounter (HOSPITAL_COMMUNITY)
Admission: RE | Admit: 2013-04-05 | Discharge: 2013-04-05 | Disposition: A | Discharge status: Home / Self Care | Payer: Medicare Other | Source: Ambulatory Visit | Attending: Cardiology | Admitting: Cardiology

## 2013-04-07 ENCOUNTER — Encounter (HOSPITAL_COMMUNITY)
Admission: RE | Admit: 2013-04-07 | Discharge: 2013-04-07 | Disposition: A | Discharge status: Home / Self Care | Payer: Medicare Other | Source: Ambulatory Visit | Attending: Cardiology | Admitting: Cardiology

## 2013-04-09 ENCOUNTER — Encounter (HOSPITAL_COMMUNITY): Payer: Medicare Other

## 2013-04-12 ENCOUNTER — Telehealth: Payer: Self-pay | Admitting: Orthopedic Surgery

## 2013-04-12 ENCOUNTER — Encounter (HOSPITAL_COMMUNITY): Payer: Medicare Other

## 2013-04-12 NOTE — Telephone Encounter (Signed)
Appointment please

## 2013-04-12 NOTE — Telephone Encounter (Signed)
Patient called, states would like to re-schedule appointment from earlier this year that she needed to cancel due to other medical issues.  She is currently undergoing cardiac physical therapy at [facility], and she states it is hurting her right knee and leg to "work on the machine," said "it is not the treadmill," - asking for Dr. Romeo Apple to advise.  I relayed that the ordering physician would need to be contacted to make any possible modifications to the therapy.  She also mentioned coming back to discuss the epidural injections also.  Please advise.  Her ph# is [phone].

## 2013-04-12 NOTE — Telephone Encounter (Signed)
Appointment scheduled.

## 2013-04-13 ENCOUNTER — Other Ambulatory Visit: Payer: Self-pay | Admitting: Cardiology

## 2013-04-13 MED ORDER — METOPROLOL TARTRATE 25 MG PO TABS: 12.5000 mg | ORAL_TABLET | Freq: Two times a day (BID) | ORAL | Status: DC

## 2013-04-14 ENCOUNTER — Encounter (HOSPITAL_COMMUNITY): Payer: Medicare Other

## 2013-04-16 ENCOUNTER — Encounter (HOSPITAL_COMMUNITY): Payer: Medicare Other

## 2013-04-19 ENCOUNTER — Encounter (HOSPITAL_COMMUNITY)
Admission: RE | Admit: 2013-04-19 | Discharge: 2013-04-19 | Disposition: A | Discharge status: Home / Self Care | Payer: Medicare Other | Source: Ambulatory Visit | Attending: Cardiology | Admitting: Cardiology

## 2013-04-19 DIAGNOSIS — Z5189 Encounter for other specified aftercare: Secondary | ICD-10-CM | POA: Insufficient documentation

## 2013-04-19 DIAGNOSIS — Z954 Presence of other heart-valve replacement: Secondary | ICD-10-CM | POA: Insufficient documentation

## 2013-04-21 ENCOUNTER — Encounter: Payer: Self-pay | Admitting: Cardiology

## 2013-04-21 ENCOUNTER — Encounter (HOSPITAL_COMMUNITY)
Admission: RE | Admit: 2013-04-21 | Discharge: 2013-04-21 | Disposition: A | Discharge status: Home / Self Care | Payer: Medicare Other | Source: Ambulatory Visit | Attending: Cardiology | Admitting: Cardiology

## 2013-04-22 ENCOUNTER — Ambulatory Visit (INDEPENDENT_AMBULATORY_CARE_PROVIDER_SITE_OTHER): Payer: Medicare Other | Admitting: Orthopedic Surgery

## 2013-04-22 ENCOUNTER — Encounter: Payer: Self-pay | Admitting: Orthopedic Surgery

## 2013-04-22 VITALS — BP 145/75 | Ht 63.0 in | Wt 156.0 lb

## 2013-04-22 DIAGNOSIS — M6580 Other synovitis and tenosynovitis, unspecified site: Secondary | ICD-10-CM

## 2013-04-22 DIAGNOSIS — M6530 Trigger finger, unspecified finger: Secondary | ICD-10-CM

## 2013-04-22 DIAGNOSIS — M76899 Other specified enthesopathies of unspecified lower limb, excluding foot: Secondary | ICD-10-CM | POA: Insufficient documentation

## 2013-04-22 DIAGNOSIS — M65311 Trigger thumb, right thumb: Secondary | ICD-10-CM | POA: Insufficient documentation

## 2013-04-22 NOTE — Progress Notes (Signed)
Patient ID: Kelli Stone, female   DOB: 1931-07-14, 79 y.o.   MRN: [id number] Chief Complaint  Patient presents with  . Knee Pain    Right knee pain hurts since starting exercises for cardiac rehab  . Hand Pain    Right thumb locking    BP 145/75  Ht 5\' 3"  (1.6 m)  Wt 156 lb (70.761 kg)  BMI 27.64 kg/m2  The patient started cardiac rehabilitation has been on a bike for an hour she has bilateral knee pain right greater than left. She was also taken off her Neurontin because she was having dizzy spells however since the Neurontin has stopped she's had 6 other dizzy spells she is going to see an ENT specialist  She has lateral knee pain on both sides and posterior and diffuse knee pain on the right with a knee replacement was done  No catching locking or giving way  She can walk in her neighborhood no problem but cannot walk on the treadmill  She does complain of catching and locking of the right thumb and that is probably from playing games on her [place]  BP 145/75  Ht 5\' 3"  (1.6 m)  Wt 156 lb (70.761 kg)  BMI 27.64 kg/m2  General appearance is normal, the patient is alert and oriented x3 with normal mood and affect.  She is laboring in her ambulation  Right and left knee iliotibial band tenderness Passive range of motion no restrictions Both knees are stable, strength is normal skin is intact good distal pulses normal sensation  Right thumb catching and locking tenderness over the A1 pulley no instability no fracture no crepitance skin normal good capillary refill  Encounter Diagnoses  Name Primary?  . Tendinitis of knee Yes  . Trigger thumb of right hand     Recommend splinting of the head right thumb  Stop  provoking activity and just normal walking for cardiac rehabilitation;  Note:  she cannot do upper extremity rehabilitation because of a previous right shoulder injury

## 2013-04-23 ENCOUNTER — Encounter (HOSPITAL_COMMUNITY): Payer: Medicare Other

## 2013-04-23 ENCOUNTER — Encounter: Payer: Self-pay | Admitting: Cardiology

## 2013-04-23 ENCOUNTER — Ambulatory Visit (INDEPENDENT_AMBULATORY_CARE_PROVIDER_SITE_OTHER): Payer: Medicare Other | Admitting: Cardiology

## 2013-04-23 VITALS — BP 144/78 | HR 59 | Ht 63.0 in | Wt 157.0 lb

## 2013-04-23 DIAGNOSIS — R55 Syncope and collapse: Secondary | ICD-10-CM

## 2013-04-23 DIAGNOSIS — I4891 Unspecified atrial fibrillation: Secondary | ICD-10-CM

## 2013-04-23 DIAGNOSIS — M6580 Other synovitis and tenosynovitis, unspecified site: Secondary | ICD-10-CM

## 2013-04-23 DIAGNOSIS — Z954 Presence of other heart-valve replacement: Secondary | ICD-10-CM

## 2013-04-23 DIAGNOSIS — Z952 Presence of prosthetic heart valve: Secondary | ICD-10-CM

## 2013-04-23 DIAGNOSIS — I10 Essential (primary) hypertension: Secondary | ICD-10-CM

## 2013-04-23 DIAGNOSIS — M76899 Other specified enthesopathies of unspecified lower limb, excluding foot: Secondary | ICD-10-CM

## 2013-04-23 NOTE — Assessment & Plan Note (Signed)
Blood pressure is controlled. No change in therapy. 

## 2013-04-23 NOTE — Progress Notes (Signed)
HPI    The patient is seen back to follow her after her aortic valve replacement. She had postoperative atrial fibrillation. Ultimately amiodarone was stopped. Rhythm remains stable. She has soreness from her chest scar. She is seen Dr. Laneta Simmers for this. This is to be given time. She's had some spells that are now improved. She may have an inner ear problem and still will be seeing ENT. She wore an event recorder that showed normal sinus rhythm.  Allergies  Allergen Reactions  . Shellfish Allergy Nausea Only and Other (See Comments)    Vomiting & diarrhea  . Bonine (Meclizine Hcl)     Nervous   . Lotrel (Amlodipine Besy-Benazepril Hcl)     Made legs  Ache   . Benadryl (Diphenhydramine Hcl) Other (See Comments)    nervous  . Cephalexin     Pt can't remember   . Other Other (See Comments)    GUIATUSSIN. Unknown reaction  . Sulfa Antibiotics Other (See Comments)    unknown  . Zocor (Simvastatin - High Dose)     Aching   . Celebrex (Celecoxib) Other (See Comments)    Chest discomfort  . Dilacor Xr (Diltiazem Hcl) Other (See Comments)    Didn't dissolve  . Erythromycin Nausea Only  . Lescol Other (See Comments)    aching  . Metformin And Related Diarrhea  . Methylprednisolone Acetate Itching and Other (See Comments)  . Naprosyn (Naproxen) Nausea Only  . Penicillins Other (See Comments)    sleepy  . Stadol (Butorphanol Tartrate) Other (See Comments)    numb  . Trinalin (Azatadine-Pseudoephedrine) Other (See Comments)    nervous    Current Outpatient Prescriptions  Medication Sig Dispense Refill  . amLODipine (NORVASC) 5 MG tablet Take 5 mg by mouth daily.      Marland Kitchen aspirin 325 MG EC tablet Take 1 tablet (325 mg total) by mouth daily.  30 tablet  6  . ergocalciferol (VITAMIN D2) 50000 UNITS capsule Take 50,000 Units by mouth 2 (two) times a week.      . Ferrous Gluconate (IRON) 240 (27 FE) MG TABS Take 1 tablet by mouth 2 (two) times daily.      Marland Kitchen HYDROcodone-acetaminophen  (NORCO) 7.5-325 MG per tablet Take 1 tablet by mouth every 4 (four) hours as needed for pain.  60 tablet  5  . latanoprost (XALATAN) 0.005 % ophthalmic solution Place 1 drop into both eyes at bedtime.        Marland Kitchen lisinopril (PRINIVIL,ZESTRIL) 40 MG tablet Take 40 mg by mouth daily.       . metoprolol tartrate (LOPRESSOR) 25 MG tablet Take 0.5 tablets (12.5 mg total) by mouth 2 (two) times daily.  30 tablet  6  . pantoprazole (PROTONIX) 40 MG tablet Take 40 mg by mouth daily.      . timolol (TIMOPTIC) 0.5 % ophthalmic solution Place 1 drop into both eyes daily.       No current facility-administered medications for this visit.    History   Social History  . Marital Status: Married    Spouse Name: N/A    Number of Children: N/A  . Years of Education: 11th grade   Occupational History  . Not on file.   Social History Main Topics  . Smoking status: Former Smoker -- 0.20 packs/day for 15 years    Types: Cigarettes    Quit date: 09/17/1983  . Smokeless tobacco: Never Used  . Alcohol Use: No  . Drug Use: No  .  Sexually Active: No   Other Topics Concern  . Not on file   Social History Narrative  . No narrative on file    Family History  Problem Relation Age of Onset  . Cancer    . Diabetes    . Diabetes Mother   . Bone cancer Father   . Diabetes Sister   . Kidney disease Sister   . Stomach cancer Sister   . Stroke Brother   . Bone cancer Brother   . Healthy Son     Past Medical History  Diagnosis Date  . Glaucoma   . GERD (gastroesophageal reflux disease)   . Personal history of tobacco use, presenting hazards to health   . Personal history of allergy to penicillin   . Personal history of allergy to sulfonamides   . Vertigo     HISTORY OF  . Diabetes mellitus     diet controlled  . HTN (hypertension)   . Dyslipidemia   . Aortic stenosis     Severe, echo, TEE, cardiac catheterizationn, surgical consultation, Dr Laneta Simmers, March, 2014  . Carotid artery disease      Doppler, March, 2014, no significant stenoses.  Marland Kitchen CAD (coronary artery disease)     Mild, catheterization, March, 2014, 30 and 40% lesions, nonobstructive, no further evaluation  . Ejection fraction     EF 60%, echo, March, 2014,  . Dyspnea     Exertional dyspnea March, 2014, felt secondary to aortic stenosis  . Arthritis   . Status post aortic valve replacement     Pericardial tissue valve,  Dr. Laneta Simmers, December 25, 2012  . Atrial fibrillation     Perioperative atrial fibrillation, April, 2014, treated with amiodarone  . Hx of amiodarone therapy     Use for perioperative atrial fibrillation, April, 2014, the dose is being weaned over time May, 2014  . Pre-syncope     June, 2014  . Skin tenderness     Anterior chest in the area of the keloid    Past Surgical History  Procedure Laterality Date  . Appendectomy    . Right shoulder    . Shoulder surgery    . Breast surgery  bilateral  . Vaginal hysterectomy    . Total knee arthroplasty  07/01/2011    Procedure: TOTAL KNEE ARTHROPLASTY;  Surgeon: Fuller Canada, MD;  Location: [facility];  Service: Orthopedics;  Laterality: Right;  Depuy  . Colonoscopy  05/07/2012    Procedure: COLONOSCOPY;  Surgeon: Malissa Hippo, MD;  Location: [facility];  Service: Endoscopy;  Laterality: N/A;  200  . Tee without cardioversion N/A 11/26/2012    Procedure: TRANSESOPHAGEAL ECHOCARDIOGRAM (TEE);  Surgeon: Laurey Morale, MD;  Location: [facility] ENDOSCOPY;  Service: Cardiovascular;  Laterality: N/A;  . Cardiac catheterization    . Eye surgery Right     glaucoma  . Aortic valve replacement N/A 12/25/2012    Procedure: AORTIC VALVE REPLACEMENT (AVR);  Surgeon: Alleen Borne, MD;  Location: [facility] OR;  Service: Open Heart Surgery;  Laterality: N/A;  . Intraoperative transesophageal echocardiogram N/A 12/25/2012    Procedure: INTRAOPERATIVE TRANSESOPHAGEAL ECHOCARDIOGRAM;  Surgeon: Alleen Borne, MD;  Location: [facility] OR;  Service: Open Heart Surgery;  Laterality: N/A;     Patient Active Problem List   Diagnosis Date Noted  . Tendinitis of knee 04/22/2013  . Trigger thumb of right hand 04/22/2013  . Pre-syncope   . Skin tenderness   . Atrial fibrillation   . Hx of amiodarone  therapy   . Status post aortic valve replacement   . Dyslipidemia   . Aortic stenosis   . Carotid artery disease   . CAD (coronary artery disease)   . Dyspnea   . Ejection fraction   . Diverticulitis of sigmoid colon 08/10/2012  . Patellar tendonitis 07/08/2012  . Anemia 04/13/2012  . Spinal stenosis 11/13/2011  . Neuropathy of leg 10/02/2011  . DVT (deep venous thrombosis) 09/19/2011  . Bursitis of shoulder, right 08/15/2011  . Difficulty in walking 08/07/2011  . S/P total knee replacement 07/15/2011  . Arthritis of knee, right 06/12/2011  . Constipated 06/12/2011  . OA (osteoarthritis) of knee 04/11/2011  . HTN (hypertension)     ROS   Patient denies fever, chills, headache, sweats, rash, change in vision, change in hearing, chest pain, cough, nausea vomiting, urinary symptoms. All other systems are reviewed and are negative other than the history of present illness.  PHYSICAL EXAM  Patient is here with her husband. She is stable. There is no jugulovenous distention. Lungs are clear. Respiratory effort is nonlabored. Cardiac exam reveals S1 and S2. There is a soft flow murmur. Her chest is nicely healed. She still has some mild anterior soreness. Abdomen is soft. There is no peripheral edema.  Filed Vitals:   04/23/13 1349  BP: 144/78  Pulse: 59  Height: 5\' 3"  (1.6 m)  Weight: 157 lb (71.215 kg)    ASSESSMENT & PLAN

## 2013-04-23 NOTE — Assessment & Plan Note (Signed)
She is doing well after her valve surgery.

## 2013-04-23 NOTE — Assessment & Plan Note (Signed)
Unfortunately cardiac rehabilitation exercise causes her increased symptoms. Therefore we will stop cardiac rehabilitation at this time.

## 2013-04-23 NOTE — Assessment & Plan Note (Signed)
She has not had any recurring episodes. Her event recorder revealed normal sinus rhythm. No further cardiac workup. She will be seeing ENT to see if she is having any vertigo.

## 2013-04-23 NOTE — Patient Instructions (Addendum)
Your physician recommends that you schedule a follow-up appointment in: 6 months. You will receive a reminder letter in the mail in about 4 months reminding you to call and schedule your appointment. If you don't receive this letter, please contact our office. Your physician recommends that you continue on your current medications as directed. Please refer to the Current Medication list given to you today. Stop cardiac rehab per Dr. Myrtis Ser due to the exercise causing leg pain.

## 2013-04-23 NOTE — Assessment & Plan Note (Signed)
She has no return of atrial fibrillation. Amiodarone was stopped in May, 2014.

## 2013-04-26 ENCOUNTER — Encounter (HOSPITAL_COMMUNITY): Payer: Medicare Other

## 2013-04-28 ENCOUNTER — Encounter (HOSPITAL_COMMUNITY): Payer: Medicare Other

## 2013-04-30 ENCOUNTER — Encounter (HOSPITAL_COMMUNITY): Payer: Medicare Other

## 2013-05-03 ENCOUNTER — Encounter (HOSPITAL_COMMUNITY): Payer: Medicare Other

## 2013-05-05 ENCOUNTER — Encounter (HOSPITAL_COMMUNITY): Payer: Medicare Other

## 2013-05-07 ENCOUNTER — Encounter (HOSPITAL_COMMUNITY): Payer: Medicare Other

## 2013-05-10 ENCOUNTER — Encounter (HOSPITAL_COMMUNITY): Payer: Medicare Other

## 2013-05-12 ENCOUNTER — Encounter (HOSPITAL_COMMUNITY): Payer: Medicare Other

## 2013-05-14 ENCOUNTER — Encounter (HOSPITAL_COMMUNITY): Payer: Medicare Other

## 2013-05-18 ENCOUNTER — Ambulatory Visit (INDEPENDENT_AMBULATORY_CARE_PROVIDER_SITE_OTHER): Payer: Medicare Other | Admitting: Orthopedic Surgery

## 2013-05-18 ENCOUNTER — Encounter: Payer: Self-pay | Admitting: Orthopedic Surgery

## 2013-05-18 VITALS — BP 170/78 | Ht 63.0 in | Wt 156.0 lb

## 2013-05-18 DIAGNOSIS — M65311 Trigger thumb, right thumb: Secondary | ICD-10-CM

## 2013-05-18 DIAGNOSIS — M76899 Other specified enthesopathies of unspecified lower limb, excluding foot: Secondary | ICD-10-CM

## 2013-05-18 DIAGNOSIS — IMO0002 Reserved for concepts with insufficient information to code with codable children: Secondary | ICD-10-CM

## 2013-05-18 DIAGNOSIS — M5410 Radiculopathy, site unspecified: Secondary | ICD-10-CM | POA: Insufficient documentation

## 2013-05-18 DIAGNOSIS — M1710 Unilateral primary osteoarthritis, unspecified knee: Secondary | ICD-10-CM

## 2013-05-18 DIAGNOSIS — M6530 Trigger finger, unspecified finger: Secondary | ICD-10-CM

## 2013-05-18 DIAGNOSIS — M1711 Unilateral primary osteoarthritis, right knee: Secondary | ICD-10-CM

## 2013-05-18 DIAGNOSIS — M6580 Other synovitis and tenosynovitis, unspecified site: Secondary | ICD-10-CM

## 2013-05-18 DIAGNOSIS — M4800 Spinal stenosis, site unspecified: Secondary | ICD-10-CM

## 2013-05-18 MED ORDER — DICLOFENAC SODIUM 1 % TD GEL: 4.0000 g | Freq: Four times a day (QID) | TRANSDERMAL | Status: DC

## 2013-05-18 NOTE — Progress Notes (Signed)
Patient ID: Kelli Stone, female   DOB: 06/25/1931, 79 y.o.   MRN: [id number] No chief complaint on file.   Chief Complaint  Patient presents with  . Knee Problem    f/u knee pain inflammation   . Hand Problem    right thumb locking    The patient's right knee has improved after she stopped ambulating she is now complaining of posterior thigh and calf pain with palpable tenderness along the posterior hamstrings and calf muscles which I believe is related to her neurologic issues related to her back  She's also having locking and triggering of the right thumb with tenderness over the A1 pulley and visible locking and catching flexion power remains intact there is tenderness and nodular mass in the tendon with no evidence of tendon rupture radial pulses normal capillary refill is normal sensation to the digit is normal  Impression Encounter Diagnoses  Name Primary?  . Osteoarthritis of right knee Yes  . Trigger thumb of right hand   . Tendinitis of knee   . Spinal stenosis   . Radicular syndrome of right leg     We will recommend Voltaren gel for tendinitis in the right knee which is related to tenderness over the patellar tendon which started and increased while she was walking for exercise during cardiac rehabilitation  Recommend repeat epidural injection for radicular syndrome right leg and spinal stenosis  Recommend injection [facility] office for right thumb trigger thumb  Procedure note trigger thumb injection  Diagnosis trigger finger Postop diagnosis trigger finger Procedure injection of trigger finger Finger injected RIGHT thumb Details of procedure: After verbal consent and timeout to confirm site the RIGHT thumb was injected with 1 cc of 40 mg of Depo-Medrol and 1 cc of 1% lidocaine  The procedure was tolerated well without complication

## 2013-05-18 NOTE — Patient Instructions (Addendum)
You have received a steroid shot. 15% of patients experience increased pain at the injection site with in the next 24 hours. This is best treated with ice and tylenol extra strength 2 tabs every 8 hours. If you are still having pain please call the office.    

## 2013-06-29 ENCOUNTER — Telehealth: Payer: Self-pay | Admitting: Cardiology

## 2013-06-29 ENCOUNTER — Ambulatory Visit: Payer: Medicare Other | Admitting: Orthopedic Surgery

## 2013-06-29 NOTE — Telephone Encounter (Signed)
Patient called and said that she slide down steps and fell backwards. Pt states that she is not in any more pain than normal. Patient asked to rate pain on a level of 1-10 1 being no pain and 10 being the worst pain she has ever fell. Pt rates pain at a 5 and says that pain is in her incision area. Patient had an appointment with Dr. Romeo Apple today (06-29-13) and patient appointment was rescheduled and Dr. Romeo Apple office informed her to call Dr. Myrtis Ser office. She states that she feels like something is pulling in her chest. Scheduled pt to see Dr. Myrtis Ser on Friday 10-17 at 3:15 PM.

## 2013-07-02 ENCOUNTER — Encounter: Payer: Self-pay | Admitting: Cardiology

## 2013-07-02 ENCOUNTER — Ambulatory Visit (INDEPENDENT_AMBULATORY_CARE_PROVIDER_SITE_OTHER): Payer: Medicare Other | Admitting: Cardiology

## 2013-07-02 VITALS — BP 147/78 | HR 60 | Ht 63.0 in | Wt 160.0 lb

## 2013-07-02 DIAGNOSIS — S299XXA Unspecified injury of thorax, initial encounter: Secondary | ICD-10-CM

## 2013-07-02 DIAGNOSIS — R208 Other disturbances of skin sensation: Secondary | ICD-10-CM

## 2013-07-02 DIAGNOSIS — Z954 Presence of other heart-valve replacement: Secondary | ICD-10-CM

## 2013-07-02 DIAGNOSIS — R209 Unspecified disturbances of skin sensation: Secondary | ICD-10-CM

## 2013-07-02 DIAGNOSIS — I2510 Atherosclerotic heart disease of native coronary artery without angina pectoris: Secondary | ICD-10-CM

## 2013-07-02 DIAGNOSIS — S298XXA Other specified injuries of thorax, initial encounter: Secondary | ICD-10-CM

## 2013-07-02 DIAGNOSIS — Z952 Presence of prosthetic heart valve: Secondary | ICD-10-CM

## 2013-07-02 DIAGNOSIS — R55 Syncope and collapse: Secondary | ICD-10-CM

## 2013-07-02 MED ORDER — HYDROCODONE-ACETAMINOPHEN 7.5-325 MG PO TABS: 1.0000 | ORAL_TABLET | Freq: Three times a day (TID) | ORAL | Status: DC | PRN

## 2013-07-02 NOTE — Assessment & Plan Note (Signed)
The patients status is stable dosed aortic valve replacement.

## 2013-07-02 NOTE — Progress Notes (Signed)
HPI  patient is seen today to followup a musculoskeletal injury that she had recently. I have been seeing her after her aortic valve replacement. She had had some atrial fibrillation and it resolved. She also had some dizzy spells. These improved. Her event recorder revealed normal sinus rhythm.  Recently, the patient slipped and was beginning to fall. She should try to grab banisters with her arms. She significantly stress her anterior chest. She now has significant pain. There is slight pain with deep inspiration. There is significant pain to palpation.  Allergies  Allergen Reactions  . Shellfish Allergy Nausea Only and Other (See Comments)    Vomiting & diarrhea  . Bonine [Meclizine Hcl]     Nervous   . Lotrel [Amlodipine Besy-Benazepril Hcl]     Made legs  Ache   . Benadryl [Diphenhydramine Hcl] Other (See Comments)    nervous  . Cephalexin     Pt can't remember   . Other Other (See Comments)    GUIATUSSIN. Unknown reaction  . Sulfa Antibiotics Other (See Comments)    unknown  . Zocor [Simvastatin - High Dose]     Aching   . Celebrex [Celecoxib] Other (See Comments)    Chest discomfort  . Dilacor Xr [Diltiazem Hcl] Other (See Comments)    Didn't dissolve  . Erythromycin Nausea Only  . Lescol Other (See Comments)    aching  . Metformin And Related Diarrhea  . Methylprednisolone Acetate Itching and Other (See Comments)  . Naprosyn [Naproxen] Nausea Only  . Penicillins Other (See Comments)    sleepy  . Stadol [Butorphanol Tartrate] Other (See Comments)    numb  . Trinalin [Azatadine-Pseudoephedrine] Other (See Comments)    nervous    Current Outpatient Prescriptions  Medication Sig Dispense Refill  . amLODipine (NORVASC) 5 MG tablet Take 5 mg by mouth daily.      Marland Kitchen aspirin 325 MG EC tablet Take 1 tablet (325 mg total) by mouth daily.  30 tablet  6  . Azelastine-Fluticasone (DYMISTA) 137-50 MCG/ACT SUSP Place 1 spray into the nose 2 (two) times daily as needed.       . diazepam (VALIUM) 5 MG tablet Take 2.5-5 mg by mouth 3 (three) times daily as needed for anxiety.      . diclofenac sodium (VOLTAREN) 1 % GEL Apply 4 g topically 4 (four) times daily.  1 Tube  5  . latanoprost (XALATAN) 0.005 % ophthalmic solution Place 1 drop into both eyes at bedtime.        Marland Kitchen lisinopril (PRINIVIL,ZESTRIL) 40 MG tablet Take 40 mg by mouth daily.       . metoprolol tartrate (LOPRESSOR) 25 MG tablet Take 0.5 tablets (12.5 mg total) by mouth 2 (two) times daily.  30 tablet  6  . pantoprazole (PROTONIX) 40 MG tablet Take 40 mg by mouth daily.      . timolol (TIMOPTIC) 0.5 % ophthalmic solution Place 1 drop into both eyes daily.      . ergocalciferol (VITAMIN D2) 50000 UNITS capsule Take 50,000 Units by mouth 2 (two) times a week.       No current facility-administered medications for this visit.    History   Social History  . Marital Status: Married    Spouse Name: N/A    Number of Children: N/A  . Years of Education: 11th grade   Occupational History  . Not on file.   Social History Main Topics  . Smoking status: Former Smoker --  0.20 packs/day for 15 years    Types: Cigarettes    Quit date: 09/17/1983  . Smokeless tobacco: Never Used  . Alcohol Use: No  . Drug Use: No  . Sexual Activity: No   Other Topics Concern  . Not on file   Social History Narrative  . No narrative on file    Family History  Problem Relation Age of Onset  . Cancer    . Diabetes    . Diabetes Mother   . Bone cancer Father   . Diabetes Sister   . Kidney disease Sister   . Stomach cancer Sister   . Stroke Brother   . Bone cancer Brother   . Healthy Son     Past Medical History  Diagnosis Date  . Glaucoma   . GERD (gastroesophageal reflux disease)   . Personal history of tobacco use, presenting hazards to health   . Personal history of allergy to penicillin   . Personal history of allergy to sulfonamides   . Vertigo     HISTORY OF  . Diabetes mellitus     diet  controlled  . HTN (hypertension)   . Dyslipidemia   . Aortic stenosis     Severe, echo, TEE, cardiac catheterizationn, surgical consultation, Dr Laneta Simmers, March, 2014  . Carotid artery disease     Doppler, March, 2014, no significant stenoses.  Marland Kitchen CAD (coronary artery disease)     Mild, catheterization, March, 2014, 30 and 40% lesions, nonobstructive, no further evaluation  . Ejection fraction     EF 60%, echo, March, 2014,  . Dyspnea     Exertional dyspnea March, 2014, felt secondary to aortic stenosis  . Arthritis   . Status post aortic valve replacement     Pericardial tissue valve,  Dr. Laneta Simmers, December 25, 2012  . Atrial fibrillation     Perioperative atrial fibrillation, April, 2014, treated with amiodarone  . Hx of amiodarone therapy     Use for perioperative atrial fibrillation, April, 2014, the dose is being weaned over time May, 2014  . Pre-syncope     June, 2014  . Skin tenderness     Anterior chest in the area of the keloid    Past Surgical History  Procedure Laterality Date  . Appendectomy    . Right shoulder    . Shoulder surgery    . Breast surgery  bilateral  . Vaginal hysterectomy    . Total knee arthroplasty  07/01/2011    Procedure: TOTAL KNEE ARTHROPLASTY;  Surgeon: Fuller Canada, MD;  Location: [facility] ORS;  Service: Orthopedics;  Laterality: Right;  Depuy  . Colonoscopy  05/07/2012    Procedure: COLONOSCOPY;  Surgeon: Malissa Hippo, MD;  Location: [facility];  Service: Endoscopy;  Laterality: N/A;  200  . Tee without cardioversion N/A 11/26/2012    Procedure: TRANSESOPHAGEAL ECHOCARDIOGRAM (TEE);  Surgeon: Laurey Morale, MD;  Location: [facility] ENDOSCOPY;  Service: Cardiovascular;  Laterality: N/A;  . Cardiac catheterization    . Eye surgery Right     glaucoma  . Aortic valve replacement N/A 12/25/2012    Procedure: AORTIC VALVE REPLACEMENT (AVR);  Surgeon: Alleen Borne, MD;  Location: [facility] OR;  Service: Open Heart Surgery;  Laterality: N/A;  . Intraoperative  transesophageal echocardiogram N/A 12/25/2012    Procedure: INTRAOPERATIVE TRANSESOPHAGEAL ECHOCARDIOGRAM;  Surgeon: Alleen Borne, MD;  Location: [facility] OR;  Service: Open Heart Surgery;  Laterality: N/A;    Patient Active Problem List   Diagnosis Date  Noted  . Chest wall injury 07/02/2013  . Osteoarthritis of right knee 05/18/2013  . Radicular syndrome of right leg 05/18/2013  . Tendinitis of knee 04/22/2013  . Trigger thumb of right hand 04/22/2013  . Pre-syncope   . Skin tenderness   . Atrial fibrillation   . Hx of amiodarone therapy   . Status post aortic valve replacement   . Dyslipidemia   . Aortic stenosis   . Carotid artery disease   . CAD (coronary artery disease)   . Dyspnea   . Ejection fraction   . Diverticulitis of sigmoid colon 08/10/2012  . Patellar tendonitis 07/08/2012  . Anemia 04/13/2012  . Spinal stenosis 11/13/2011  . Neuropathy of leg 10/02/2011  . DVT (deep venous thrombosis) 09/19/2011  . Bursitis of shoulder, right 08/15/2011  . Difficulty in walking 08/07/2011  . S/P total knee replacement 07/15/2011  . Arthritis of knee, right 06/12/2011  . Constipated 06/12/2011  . OA (osteoarthritis) of knee 04/11/2011  . HTN (hypertension)     ROS   patient denies fever, chills, headache, sweats, rash, change in vision, change in hearing, cough, nausea vomiting, urinary symptoms. All other systems are reviewed and are negative.  PHYSICAL EXAM  patient is oriented to person time and place. Affect is normal. She's here with her husband. There is no jugulovenous distention. Lungs are clear. Respiratory effort is not labored. There is significant discomfort to palpation of the anterior chest. Her chest wall does not appear to be unstable. Abdomen is soft. There is no peripheral edema.  Filed Vitals:   07/02/13 1509  BP: 147/78  Pulse: 60  Height: 5\' 3"  (1.6 m)  Weight: 160 lb (72.576 kg)     ASSESSMENT & PLAN

## 2013-07-02 NOTE — Assessment & Plan Note (Signed)
She is having significant pain to palpation of her anterior chest after her chest wall injury recently. This occurred when she slipped and began to grab banisters with her arms. She then stressed chest her anterior chest. Her chest appears to be stable. She tolerated pain medication including hydrocodone after her knee was treated recently. I will give her prescription for this. I considered nonsteroidal anti-inflammatory meds but I've chosen not to give her these today. She will be seeing Dr. Laneta Simmers next week.

## 2013-07-02 NOTE — Patient Instructions (Signed)
Your physician recommends that you schedule a follow-up appointment in: 6 months. You will receive a reminder letter in the mail in about 4 months reminding you to call and schedule your appointment. If you don't receive this letter, please contact our office. Your physician recommends that you continue on your current medications as directed. Please refer to the Current Medication list given to you today. You have been given a prescription today for hydrocodone/acetaminophen 7.5/325 mg to take every 8 hours as needed for pain. No refills.

## 2013-07-02 NOTE — Assessment & Plan Note (Signed)
She continues to have mild tenderness in the area of the keloid on her chest.

## 2013-07-02 NOTE — Assessment & Plan Note (Signed)
She has not had any recurrent significant presyncopal spells.

## 2013-07-14 ENCOUNTER — Ambulatory Visit (INDEPENDENT_AMBULATORY_CARE_PROVIDER_SITE_OTHER): Payer: Medicare Other | Admitting: Surgery

## 2013-07-14 ENCOUNTER — Encounter: Payer: Self-pay | Admitting: Surgery

## 2013-07-14 VITALS — BP 156/77 | HR 66 | Resp 17 | Ht 63.0 in | Wt 163.0 lb

## 2013-07-14 DIAGNOSIS — I359 Nonrheumatic aortic valve disorder, unspecified: Secondary | ICD-10-CM

## 2013-07-14 NOTE — Progress Notes (Signed)
[address]       [place] [address]             [phone]        HPI:  The patient today for followup status post aortic valve replacement using a 21 mm Edwards pericardial valve on 12/25/2012. When I last saw her on 03/08/2013 she was complaining of pain in her incision and had developed a keloid. We decided to continue following this with the hope that it would continue to improve. She says she feels like it has improved some but is still tender to touch along the incision and to the sides of the upper end of the incision. She fell backwards recently and broke her fall by grabbing a banister behind her with her arms and feels like this made the pain worse. She wanted me to check it.   Current Outpatient Prescriptions  Medication Sig Dispense Refill  . amLODipine (NORVASC) 5 MG tablet Take 5 mg by mouth daily.      Marland Kitchen aspirin 325 MG EC tablet Take 1 tablet (325 mg total) by mouth daily.  30 tablet  6  . Azelastine-Fluticasone (DYMISTA) 137-50 MCG/ACT SUSP Place 1 spray into the nose 2 (two) times daily as needed.      . diazepam (VALIUM) 5 MG tablet Take 2.5-5 mg by mouth 3 (three) times daily as needed for anxiety.      . diclofenac sodium (VOLTAREN) 1 % GEL Apply 4 g topically 4 (four) times daily.  1 Tube  5  . ergocalciferol (VITAMIN D2) 50000 UNITS capsule Take 50,000 Units by mouth 2 (two) times a week.      Marland Kitchen HYDROcodone-acetaminophen (NORCO) 7.5-325 MG per tablet Take 1 tablet by mouth every 8 (eight) hours as needed for pain.  20 tablet  0  . latanoprost (XALATAN) 0.005 % ophthalmic solution Place 1 drop into both eyes at bedtime.        Marland Kitchen lisinopril (PRINIVIL,ZESTRIL) 40 MG tablet Take 40 mg by mouth daily.       . metoprolol tartrate (LOPRESSOR) 25 MG tablet Take 0.5 tablets (12.5 mg total) by mouth 2 (two) times daily.  30 tablet  6  . pantoprazole (PROTONIX) 40 MG tablet Take 40 mg by mouth daily.      . timolol (TIMOPTIC) 0.5 % ophthalmic solution  Place 1 drop into both eyes daily.       No current facility-administered medications for this visit.     Physical Exam:  BP 156/77  Pulse 66  Resp 17  Ht 5\' 3"  (1.6 m)  Wt 163 lb (73.936 kg)  BMI 28.88 kg/m2  SpO2 98% She looks great The incision is well-healed. There is a mild keloid with slight pink color along the edges. It is tender to touch. The sternum is stable and there is no chest wall deformity. Lungs are clear Cardiac exam shows a regular rate and rhythm.  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 This is very common and gradually improves but can take over a year to completely resolve. There is no specific treatment for this and I don't think it is worth starting her on Neurontin or Lyrica due to the potential side effects in this woman who already has episodes of dizziness and lots of medication allergies. I reassured her that this will usually gradually resolve.     Plan:  She will  return to see me as needed if this does not continue to improve.

## 2013-07-20 ENCOUNTER — Ambulatory Visit: Payer: Medicare Other | Admitting: Orthopedic Surgery

## 2013-08-02 ENCOUNTER — Other Ambulatory Visit: Payer: Self-pay | Admitting: Cardiology

## 2013-08-02 ENCOUNTER — Ambulatory Visit (INDEPENDENT_AMBULATORY_CARE_PROVIDER_SITE_OTHER): Payer: Medicare Other

## 2013-08-02 ENCOUNTER — Ambulatory Visit (INDEPENDENT_AMBULATORY_CARE_PROVIDER_SITE_OTHER): Payer: Medicare Other | Admitting: Orthopedic Surgery

## 2013-08-02 VITALS — BP 154/80 | Ht 63.0 in | Wt 156.0 lb

## 2013-08-02 DIAGNOSIS — M25569 Pain in unspecified knee: Secondary | ICD-10-CM

## 2013-08-02 DIAGNOSIS — G5790 Unspecified mononeuropathy of unspecified lower limb: Secondary | ICD-10-CM

## 2013-08-02 DIAGNOSIS — M25561 Pain in right knee: Secondary | ICD-10-CM

## 2013-08-02 DIAGNOSIS — G5791 Unspecified mononeuropathy of right lower limb: Secondary | ICD-10-CM

## 2013-08-02 MED ORDER — GABAPENTIN 100 MG PO CAPS: 100.0000 mg | ORAL_CAPSULE | Freq: Three times a day (TID) | ORAL | Status: DC

## 2013-08-02 NOTE — Addendum Note (Signed)
Addended by: Carmela Hurt on: 08/02/2013 05:20 PM   Modules accepted: Orders, Medications

## 2013-08-02 NOTE — Progress Notes (Signed)
Patient ID: Kelli Stone, female   DOB: July 28, 1931, 79 y.o.   MRN: [id number]  Chief Complaint  Patient presents with  . Knee Pain    Right knee pain s/p fall  approx 07/19/13 and recheck left thumb    The patient fell about 3 weeks ago presents with persistent radicular pain in her right leg posterior calf lateral anterior leg burning pain 7/10 constant her pain was controlled somewhat with Neurontin and she did get 2 epidural injections though they did not help.  She was taken off Neurontin during the course of her cardiac workup and treatment and has not been on any since then  She is having some high pain she's going to see the eye doctor. She's had some dizziness  Her medical history is as follows Past Medical History  Diagnosis Date  . Glaucoma   . GERD (gastroesophageal reflux disease)   . Personal history of tobacco use, presenting hazards to health   . Personal history of allergy to penicillin   . Personal history of allergy to sulfonamides   . Vertigo     HISTORY OF  . Diabetes mellitus     diet controlled  . HTN (hypertension)   . Dyslipidemia   . Aortic stenosis     Severe, echo, TEE, cardiac catheterizationn, surgical consultation, Dr Laneta Simmers, March, 2014  . Carotid artery disease     Doppler, March, 2014, no significant stenoses.  Marland Kitchen CAD (coronary artery disease)     Mild, catheterization, March, 2014, 30 and 40% lesions, nonobstructive, no further evaluation  . Ejection fraction     EF 60%, echo, March, 2014,  . Dyspnea     Exertional dyspnea March, 2014, felt secondary to aortic stenosis  . Arthritis   . Status post aortic valve replacement     Pericardial tissue valve,  Dr. Laneta Simmers, December 25, 2012  . Atrial fibrillation     Perioperative atrial fibrillation, April, 2014, treated with amiodarone  . Hx of amiodarone therapy     Use for perioperative atrial fibrillation, April, 2014, the dose is being weaned over time May, 2014  . Pre-syncope     June, 2014  .  Skin tenderness     Anterior chest in the area of the keloid    Past Surgical History  Procedure Laterality Date  . Appendectomy    . Right shoulder    . Shoulder surgery    . Breast surgery  bilateral  . Vaginal hysterectomy    . Total knee arthroplasty  07/01/2011    Procedure: TOTAL KNEE ARTHROPLASTY;  Surgeon: Fuller Canada, MD;  Location: [facility];  Service: Orthopedics;  Laterality: Right;  Depuy  . Colonoscopy  05/07/2012    Procedure: COLONOSCOPY;  Surgeon: Malissa Hippo, MD;  Location: [facility];  Service: Endoscopy;  Laterality: N/A;  200  . Tee without cardioversion N/A 11/26/2012    Procedure: TRANSESOPHAGEAL ECHOCARDIOGRAM (TEE);  Surgeon: Laurey Morale, MD;  Location: [facility] ENDOSCOPY;  Service: Cardiovascular;  Laterality: N/A;  . Cardiac catheterization    . Eye surgery Right     glaucoma  . Aortic valve replacement N/A 12/25/2012    Procedure: AORTIC VALVE REPLACEMENT (AVR);  Surgeon: Alleen Borne, MD;  Location: [facility] OR;  Service: Open Heart Surgery;  Laterality: N/A;  . Intraoperative transesophageal echocardiogram N/A 12/25/2012    Procedure: INTRAOPERATIVE TRANSESOPHAGEAL ECHOCARDIOGRAM;  Surgeon: Alleen Borne, MD;  Location: [facility] OR;  Service: Open Heart Surgery;  Laterality: N/A;  BP 154/80  Ht 5\' 3"  (1.6 m)  Wt 156 lb (70.761 kg)  BMI 27.64 kg/m2 She is awake alert and oriented x3 her mood is pleasant she walks with a supportive cane  Her knee flexion is normal she has pain when she extends her knee but is in the back of her leg her knee is stable her muscle tone is good her skin contact her incision looks good. She has pain with dorsiflexion of the foot indicating a positive seated straight leg raise.  Her x-rays negative for any problem with her and plan  Impression persistent radicular syndrome of the right leg  Recommend epidural injection if IM shot of Depo-Medrol and Neurontin titrate 100 mg up to 300 mg does not improve her symptoms  She is  moving possibly to [place] and she is going there for the Christmas break so I will see her on the 16th before she leaves on the 18th

## 2013-08-02 NOTE — Patient Instructions (Signed)
Injection Hip   Start gabapentin 100 mg at night then increase to 100 mg twice a day and then 3 times a day if no symptoms

## 2013-08-24 NOTE — Addendum Note (Signed)
Encounter addended by: Angelica Pou, RN on: 08/24/2013  7:05 AM<BR>     Documentation filed: Notes Section

## 2013-08-24 NOTE — Addendum Note (Signed)
Encounter addended by: Angelica Pou, RN on: 08/24/2013  7:06 AM<BR>     Documentation filed: Clinical Notes

## 2013-08-24 NOTE — Progress Notes (Signed)
Cardiac Rehabilitation Program Outcomes Report   Orientation:  02/18/2013 Graduate Date: NA Discharge Date:  04/21/2013 # of sessions completed: 15 DX: Aortic Valve replacement  Cardiologist: Myrtis Ser Family MD:  Sherril Croon Class Time:  09:30  A.  Exercise Program:  Tolerates exercise @ 3.74 METS for 15 minutes, Walk Test Results:  Pre: No walk test performed and Discharged  B.  Mental Health:  Good mental attitude  C.  Education/Instruction/Skills  Accurately checks own pulse.  Rest:  56  Exercise: 78, Knows THR for exercise, Uses Perceived Exertion Scale and/or Dyspnea Scale and Attended 6 education classes  Uses Perceived Exertion Scale and/or Dyspnea Scale  D.  Nutrition/Weight Control/Body Composition:  Adherence to prescribed nutrition program: good    E.  Blood Lipids    Lab Results  Component Value Date   CHOL  Value: 180        ATP III CLASSIFICATION:  <200     mg/dL   Desirable  829-562  mg/dL   Borderline High  >=130    mg/dL   High 8/65/7846   HDL 28* 03/10/2007   LDLCALC  Value: 121        Total Cholesterol/HDL:CHD Risk Coronary Heart Disease Risk Table                     Men   Women  1/2 Average Risk   3.4   3.3* 03/10/2007   TRIG 153* 03/10/2007   CHOLHDL 6.4 03/10/2007    F.  Lifestyle Changes:  Making positive lifestyle changes and Not smoking:  Quit 1984  G.  Symptoms noted with exercise:  Asymptomatic  Report Completed By:  Lelon Huh. Nayely Dingus RN   Comments:  This is patients discharge report. She did well while in rehab, She only attended 15 sessions. She achieved a peak METS of 3.74. Her resting HR was 56 and resting BP was 140/58 and peak HR was 78 and peak BP was 152/82. She never came back to finish program.

## 2013-08-31 ENCOUNTER — Telehealth: Payer: Self-pay | Admitting: Orthopedic Surgery

## 2013-08-31 ENCOUNTER — Ambulatory Visit: Payer: Medicare Other | Admitting: Orthopedic Surgery

## 2013-08-31 NOTE — Telephone Encounter (Signed)
Patient called this morning, states has appointment conflict today, 08/31/13, therefore said cannot come to her scheduled visit - I offered alternate times and days, as she said she is leaving to go out of state for holidays.  She states still unable to come today or tomorrow; elects to call back to re-schedule.

## 2013-09-23 ENCOUNTER — Ambulatory Visit (INDEPENDENT_AMBULATORY_CARE_PROVIDER_SITE_OTHER): Payer: Medicare Other | Admitting: Orthopedic Surgery

## 2013-09-23 VITALS — BP 160/80 | Ht 63.0 in | Wt 156.0 lb

## 2013-09-23 DIAGNOSIS — M5410 Radiculopathy, site unspecified: Secondary | ICD-10-CM

## 2013-09-23 DIAGNOSIS — IMO0002 Reserved for concepts with insufficient information to code with codable children: Secondary | ICD-10-CM

## 2013-09-23 MED ORDER — HYDROCODONE-ACETAMINOPHEN 7.5-325 MG PO TABS: 1.0000 | ORAL_TABLET | ORAL | Status: DC | PRN

## 2013-09-23 NOTE — Progress Notes (Signed)
Patient ID: Kelli Stone, female   DOB: Jan 22, 1931, 80 y.o.   MRN: [id number] Right leg pain  80-year-old female status post right total knee replacement has persistent right posterior leg pain has a disc at L5-S1 but is primarily to the left making her right lower extremity radicular symptoms atypical  She's had 2 epidural steroid injections they have helped only modestly. She does get relief from 7.5 mg of hydrocodone. She did get relief prior to that with gabapentin. However she had a heart attack and they stopped the gabapentin and she has not been able to restart it secondary to drowsiness.   BP 160/80  Ht 5\' 3"  (1.6 m)  Wt 156 lb (70.761 kg)  BMI 27.64 kg/m2 General appearance is normal, the patient is alert and oriented x3 with normal mood and affect.  She has weakness in the right lower extremity as well. She has pain in the posterior aspect of the knee no knee swelling there is some tenderness of the pes bursa of the right knee.  She is in Lipitor without assistive device  Hepatosplenomegaly continue her on hydrocodone. I've ordered a third epidural injection if she doesn't improve I am going to send her for a root block to see if that will help. She's been worked up for infection and that was negative

## 2013-09-23 NOTE — Patient Instructions (Signed)
Epidural series

## 2013-11-04 ENCOUNTER — Telehealth: Payer: Self-pay | Admitting: Orthopedic Surgery

## 2013-11-04 NOTE — Telephone Encounter (Signed)
Please reorder injections first

## 2013-11-04 NOTE — Telephone Encounter (Signed)
Kelli Stone is scheduled to see Dr. Romeo AppleHarrison 11-09-13 for follow up after epidural injections.  She has not had the injections because no one ever called her. Will you call her, she wants to know if she can still come in

## 2013-11-05 ENCOUNTER — Other Ambulatory Visit: Payer: Self-pay | Admitting: Orthopedic Surgery

## 2013-11-05 DIAGNOSIS — M5410 Radiculopathy, site unspecified: Secondary | ICD-10-CM

## 2013-11-05 NOTE — Telephone Encounter (Signed)
[facility] to [facility]SI. I called patient and made her aware.

## 2013-11-08 ENCOUNTER — Emergency Department (HOSPITAL_COMMUNITY): Payer: Medicare Other

## 2013-11-08 ENCOUNTER — Ambulatory Visit
Admission: RE | Admit: 2013-11-08 | Discharge: 2013-11-08 | Disposition: A | Discharge status: Home / Self Care | Payer: Medicare Other | Source: Ambulatory Visit | Attending: Orthopedic Surgery | Admitting: Orthopedic Surgery

## 2013-11-08 ENCOUNTER — Observation Stay (HOSPITAL_COMMUNITY)
Admission: EM | Admit: 2013-11-08 | Discharge: 2013-11-09 | Disposition: A | Discharge status: Home / Self Care | Payer: Medicare Other | Attending: Cardiology | Admitting: Cardiology

## 2013-11-08 ENCOUNTER — Encounter (HOSPITAL_COMMUNITY): Payer: Self-pay | Admitting: Emergency Medicine

## 2013-11-08 ENCOUNTER — Telehealth: Payer: Self-pay | Admitting: General Surgery

## 2013-11-08 VITALS — BP 173/82 | HR 57

## 2013-11-08 DIAGNOSIS — Z87891 Personal history of nicotine dependence: Secondary | ICD-10-CM | POA: Insufficient documentation

## 2013-11-08 DIAGNOSIS — I359 Nonrheumatic aortic valve disorder, unspecified: Secondary | ICD-10-CM

## 2013-11-08 DIAGNOSIS — R0989 Other specified symptoms and signs involving the circulatory and respiratory systems: Secondary | ICD-10-CM | POA: Insufficient documentation

## 2013-11-08 DIAGNOSIS — Z96659 Presence of unspecified artificial knee joint: Secondary | ICD-10-CM | POA: Insufficient documentation

## 2013-11-08 DIAGNOSIS — R0609 Other forms of dyspnea: Secondary | ICD-10-CM | POA: Diagnosis present

## 2013-11-08 DIAGNOSIS — Z952 Presence of prosthetic heart valve: Secondary | ICD-10-CM

## 2013-11-08 DIAGNOSIS — E785 Hyperlipidemia, unspecified: Secondary | ICD-10-CM | POA: Diagnosis present

## 2013-11-08 DIAGNOSIS — E119 Type 2 diabetes mellitus without complications: Secondary | ICD-10-CM | POA: Diagnosis present

## 2013-11-08 DIAGNOSIS — Z954 Presence of other heart-valve replacement: Secondary | ICD-10-CM

## 2013-11-08 DIAGNOSIS — M5410 Radiculopathy, site unspecified: Secondary | ICD-10-CM

## 2013-11-08 DIAGNOSIS — K219 Gastro-esophageal reflux disease without esophagitis: Secondary | ICD-10-CM | POA: Insufficient documentation

## 2013-11-08 DIAGNOSIS — I350 Nonrheumatic aortic (valve) stenosis: Secondary | ICD-10-CM | POA: Diagnosis present

## 2013-11-08 DIAGNOSIS — I10 Essential (primary) hypertension: Secondary | ICD-10-CM | POA: Diagnosis present

## 2013-11-08 DIAGNOSIS — R079 Chest pain, unspecified: Principal | ICD-10-CM | POA: Diagnosis present

## 2013-11-08 DIAGNOSIS — R0600 Dyspnea, unspecified: Secondary | ICD-10-CM | POA: Diagnosis present

## 2013-11-08 DIAGNOSIS — H409 Unspecified glaucoma: Secondary | ICD-10-CM | POA: Insufficient documentation

## 2013-11-08 DIAGNOSIS — I4891 Unspecified atrial fibrillation: Secondary | ICD-10-CM

## 2013-11-08 DIAGNOSIS — R55 Syncope and collapse: Secondary | ICD-10-CM | POA: Diagnosis present

## 2013-11-08 DIAGNOSIS — I2510 Atherosclerotic heart disease of native coronary artery without angina pectoris: Secondary | ICD-10-CM | POA: Diagnosis present

## 2013-11-08 HISTORY — DX: Essential (primary) hypertension: I10

## 2013-11-08 HISTORY — DX: Type 2 diabetes mellitus without complications: E11.9

## 2013-11-08 HISTORY — DX: Atherosclerotic heart disease of native coronary artery without angina pectoris: I25.10

## 2013-11-08 LAB — I-STAT TROPONIN, ED: TROPONIN I, POC: 0 ng/mL (ref 0.00–0.08)

## 2013-11-08 LAB — BASIC METABOLIC PANEL
BUN: 14 mg/dL (ref 6–23)
CHLORIDE: 105 meq/L (ref 96–112)
CO2: 25 meq/L (ref 19–32)
Calcium: 9.5 mg/dL (ref 8.4–10.5)
Creatinine, Ser: 1.3 mg/dL — ABNORMAL HIGH (ref 0.50–1.10)
GFR calc Af Amer: 44 mL/min — ABNORMAL LOW (ref >90)
GFR calc non Af Amer: 38 mL/min — ABNORMAL LOW (ref >90)
GLUCOSE: 102 mg/dL — AB (ref 70–99)
POTASSIUM: 4.3 meq/L (ref 3.7–5.3)
Sodium: 144 mEq/L (ref 137–147)

## 2013-11-08 LAB — CBC
HEMATOCRIT: 35 % — AB (ref 36.0–46.0)
HEMOGLOBIN: 12 g/dL (ref 12.0–15.0)
MCH: 26.6 pg (ref 26.0–34.0)
MCHC: 34.3 g/dL (ref 30.0–36.0)
MCV: 77.6 fL — ABNORMAL LOW (ref 78.0–100.0)
Platelets: 293 10*3/uL (ref 150–400)
RBC: 4.51 MIL/uL (ref 3.87–5.11)
RDW: 13.8 % (ref 11.5–15.5)
WBC: 6 10*3/uL (ref 4.0–10.5)

## 2013-11-08 LAB — D-DIMER, QUANTITATIVE (NOT AT ARMC): D DIMER QUANT: 0.87 ug{FEU}/mL — AB (ref 0.00–0.48)

## 2013-11-08 LAB — PRO B NATRIURETIC PEPTIDE: Pro B Natriuretic peptide (BNP): 240 pg/mL (ref 0–450)

## 2013-11-08 MED ORDER — NITROGLYCERIN 0.4 MG SL SUBL: 0.4000 mg | SUBLINGUAL_TABLET | SUBLINGUAL | Status: DC | PRN

## 2013-11-08 MED ORDER — ASPIRIN EC 325 MG PO TBEC
325.0000 mg | DELAYED_RELEASE_TABLET | Freq: Every day | ORAL | Status: DC
  Administered 2013-11-09: 325 mg via ORAL
  Filled 2013-11-08: qty 1

## 2013-11-08 MED ORDER — GABAPENTIN 100 MG PO CAPS
100.0000 mg | ORAL_CAPSULE | Freq: Every day | ORAL | Status: DC | PRN
  Filled 2013-11-08: qty 1

## 2013-11-08 MED ORDER — LISINOPRIL 40 MG PO TABS
40.0000 mg | ORAL_TABLET | Freq: Every day | ORAL | Status: DC
  Administered 2013-11-09: 40 mg via ORAL
  Filled 2013-11-08: qty 1

## 2013-11-08 MED ORDER — DIAZEPAM 5 MG PO TABS
2.5000 mg | ORAL_TABLET | Freq: Three times a day (TID) | ORAL | Status: DC | PRN
  Administered 2013-11-09: 2.5 mg via ORAL
  Filled 2013-11-08: qty 1

## 2013-11-08 MED ORDER — AMLODIPINE BESYLATE 5 MG PO TABS
5.0000 mg | ORAL_TABLET | Freq: Every day | ORAL | Status: DC | PRN
  Administered 2013-11-09: 5 mg via ORAL
  Filled 2013-11-08: qty 1

## 2013-11-08 MED ORDER — PANTOPRAZOLE SODIUM 40 MG PO TBEC
40.0000 mg | DELAYED_RELEASE_TABLET | Freq: Every day | ORAL | Status: DC
  Administered 2013-11-09: 40 mg via ORAL

## 2013-11-08 MED ORDER — IOHEXOL 350 MG/ML SOLN
100.0000 mL | Freq: Once | INTRAVENOUS | Status: AC | PRN
  Administered 2013-11-08: 100 mL via INTRAVENOUS

## 2013-11-08 MED ORDER — METOPROLOL TARTRATE 25 MG PO TABS: 25.0000 mg | ORAL_TABLET | Freq: Once | ORAL | Status: DC

## 2013-11-08 MED ORDER — TIMOLOL MALEATE 0.5 % OP SOLN
1.0000 [drp] | Freq: Every day | OPHTHALMIC | Status: DC
  Filled 2013-11-08: qty 5

## 2013-11-08 MED ORDER — AZELASTINE HCL 0.1 % NA SOLN
1.0000 | Freq: Every day | NASAL | Status: DC
  Filled 2013-11-08: qty 30

## 2013-11-08 MED ORDER — IOHEXOL 180 MG/ML  SOLN: 1.0000 mL | Freq: Once | INTRAMUSCULAR | Status: DC | PRN

## 2013-11-08 MED ORDER — ENOXAPARIN SODIUM 40 MG/0.4ML ~~LOC~~ SOLN
40.0000 mg | SUBCUTANEOUS | Status: DC
  Administered 2013-11-09: 40 mg via SUBCUTANEOUS
  Filled 2013-11-08: qty 0.4

## 2013-11-08 MED ORDER — LATANOPROST 0.005 % OP SOLN
1.0000 [drp] | Freq: Every day | OPHTHALMIC | Status: DC
  Filled 2013-11-08: qty 2.5

## 2013-11-08 NOTE — ED Notes (Signed)
Pt reports that she has been having some SOB and chest pain over the past 3 weeks. Reports she had a valve in her heart replaced back in may. Denies any chest pain at this time. Reports increased sob with activity.

## 2013-11-08 NOTE — ED Notes (Signed)
2 RNs attempted to obtain IV access. 22 left AC only access obtained. IV team paged.

## 2013-11-08 NOTE — Discharge Instructions (Signed)

## 2013-11-08 NOTE — ED Provider Notes (Signed)
CSN: [id number]     Arrival date & time 11/08/13  1110 History   First MD Initiated Contact with Patient 11/08/13 1129     Chief Complaint  Patient presents with  . Chest Pain     (Consider location/radiation/quality/duration/timing/severity/associated sxs/prior Treatment) HPI Pt with hx of aortic valve replacement approx 1 year ago presents with c/o fatigue and shortness of breath with exertion associated with lower sternal chest pain which has been occurrring intermittently over the past several weeks.  She was seen this morning and was to have a injection by radiology but mentioned her symptoms over the past few weeks and was referred to come to the ED.  Currently she has no symptoms.  States that with several steps she gets short of breath and sometimes just with standing at the stove to cook.  No fever/chills or cough.  No leg swelling.  No hx of DVT/PE, no recent travel/trauma/surgery.  Symptoms resolve with rest. There are no other associated systemic symptoms, there are no other alleviating or modifying factors.   Past Medical History  Diagnosis Date  . Glaucoma   . GERD (gastroesophageal reflux disease)   . Vertigo   . Type 2 diabetes mellitus     Diet controlled  . Essential hypertension, benign   . Dyslipidemia   . Aortic stenosis     Severe - subsequent 21 mm Edwards pericardial valve on April 2014  . Coronary atherosclerosis of native coronary artery     Mild, catheterization, March, 2014, 30 and 40% lesions, nonobstructive, no further evaluation  . Ejection fraction     EF 60%, echo, March, 2014,  . Arthritis   . Atrial fibrillation     Perioperative atrial fibrillation, April, 2014, treated with amiodarone  . Hx of amiodarone therapy     Use for perioperative atrial fibrillation, April, 2014, the dose is being weaned over time May, 2014  . Pre-syncope     June, 2014  . Skin tenderness     Anterior chest in the area of the keloid   Past Surgical History  Procedure  Laterality Date  . Appendectomy    . Right shoulder    . Shoulder surgery    . Breast surgery  bilateral  . Vaginal hysterectomy    . Total knee arthroplasty  07/01/2011    Procedure: TOTAL KNEE ARTHROPLASTY;  Surgeon: Fuller CanadaStanley Harrison, MD;  Location: [facility];  Service: Orthopedics;  Laterality: Right;  Depuy  . Colonoscopy  05/07/2012    Procedure: COLONOSCOPY;  Surgeon: Malissa HippoNajeeb U Rehman, MD;  Location: [facility];  Service: Endoscopy;  Laterality: N/A;  200  . Tee without cardioversion N/A 11/26/2012    Procedure: TRANSESOPHAGEAL ECHOCARDIOGRAM (TEE);  Surgeon: Laurey Moralealton S McLean, MD;  [facility];  Service: Cardiovascular;  Laterality: N/A;  . Eye surgery Right     glaucoma  . Aortic valve replacement N/A 12/25/2012    Procedure: AORTIC VALVE REPLACEMENT (AVR);  Surgeon: Alleen BorneBryan K Bartle, MD;  [facility];  Service: Open Heart Surgery;  Laterality: N/A;  . Intraoperative transesophageal echocardiogram N/A 12/25/2012    Procedure: INTRAOPERATIVE TRANSESOPHAGEAL ECHOCARDIOGRAM;  Surgeon: Alleen BorneBryan K Bartle, MD;  [facility];  Service: Open Heart Surgery;  Laterality: N/A;   Family History  Problem Relation Age of Onset  . Cancer    . Diabetes    . Diabetes Mother   . Bone cancer Father   . Diabetes Sister   . Kidney disease Sister   . Stomach  cancer Sister   . Stroke Brother   . Bone cancer Brother   . Healthy Son    History  Substance Use Topics  . Smoking status: Former Smoker -- 0.20 packs/day for 15 years    Types: Cigarettes    Quit date: 09/17/1983  . Smokeless tobacco: Never Used  . Alcohol Use: No   OB History   Grav Para Term Preterm Abortions TAB SAB Ect Mult Living                 Review of Systems ROS reviewed and all otherwise negative except for mentioned in HPI    Allergies  Shellfish allergy; Bonine; Lotrel; Benadryl; Benadryl; Cephalexin; Lactose intolerance (gi); Other; Sulfa antibiotics; Zocor; Celebrex; Dilacor xr; Erythromycin; Lescol;  Metformin and related; Methylprednisolone acetate; Naprosyn; Penicillins; Stadol; and Trinalin  Home Medications   No current outpatient prescriptions on file. BP 163/77  Pulse 91  Temp(Src) 98 F (36.7 C) (Oral)  Resp 18  Ht 5\' 3"  (1.6 m)  Wt 158 lb 11.2 oz (71.986 kg)  BMI 28.12 kg/m2  SpO2 99% Vitals reviewed Physical Exam Physical Examination: General appearance - alert, well appearing, and in no distress Mental status - alert, oriented to person, place, and time Eyes - no conjunctival injection, no scleral icterus Mouth - mucous membranes moist, pharynx normal without lesions Chest - clear to auscultation, no wheezes, rales or rhonchi, symmetric air entry Heart - normal rate, regular rhythm, normal S1, S2, no murmurs, rubs, clicks or gallops Abdomen - soft, nontender, nondistended, no masses or organomegaly Extremities - peripheral pulses normal, no pedal edema, no clubbing or cyanosis Skin - normal coloration and turgor, no rashes  ED Course  Procedures (including critical care time) Labs Review Labs Reviewed  CBC - Abnormal; Notable for the following:    HCT 35.0 (*)    MCV 77.6 (*)    All other components within normal limits  BASIC METABOLIC PANEL - Abnormal; Notable for the following:    Glucose, Bld 102 (*)    Creatinine, Ser 1.30 (*)    GFR calc non Af Amer 38 (*)    GFR calc Af Amer 44 (*)    All other components within normal limits  D-DIMER, QUANTITATIVE - Abnormal; Notable for the following:    D-Dimer, Quant 0.87 (*)    All other components within normal limits  LIPID PANEL - Abnormal; Notable for the following:    Cholesterol 284 (*)    Triglycerides 161 (*)    LDL Cholesterol 204 (*)    All other components within normal limits  BASIC METABOLIC PANEL - Abnormal; Notable for the following:    Creatinine, Ser 1.15 (*)    GFR calc non Af Amer 44 (*)    GFR calc Af Amer 51 (*)    All other components within normal limits  CBC - Abnormal; Notable for  the following:    Hemoglobin 11.7 (*)    HCT 34.1 (*)    MCV 77.9 (*)    All other components within normal limits  CREATININE, SERUM - Abnormal; Notable for the following:    Creatinine, Ser 1.23 (*)    GFR calc non Af Amer 40 (*)    GFR calc Af Amer 47 (*)    All other components within normal limits  PRO B NATRIURETIC PEPTIDE  TROPONIN I  TROPONIN I  TSH  TROPONIN I  HEMOGLOBIN A1C  I-STAT TROPOININ, ED   Imaging Review Dg Chest 2 View  11/08/2013   CLINICAL DATA:  Chest pressure, discomfort  EXAM: CHEST  2 VIEW  COMPARISON:  05/21/2013  FINDINGS: Cardiomediastinal silhouette is stable. Status post median sternotomy. No acute infiltrate or pulmonary edema. Stable linear scarring or atelectasis left base. Mild degenerative changes thoracic spine.  IMPRESSION: No active cardiopulmonary disease. Mild degenerative changes thoracic spine.   Electronically Signed   By: Natasha Mead M.D.   On: 11/08/2013 12:57   Ct Angio Chest Pe W/cm &/or Wo Cm  11/08/2013   CLINICAL DATA:  Shortness breath and chest pain. Status post valve replacement. Agree shortness breath with activity.  EXAM: CT ANGIOGRAPHY CHEST WITH CONTRAST  TECHNIQUE: Multidetector CT imaging of the chest was performed using the standard protocol during bolus administration of intravenous contrast. Multiplanar CT image reconstructions and MIPs were obtained to evaluate the vascular anatomy.  CONTRAST:  OMNIPAQUE IOHEXOL 350 MG/ML SOLN  COMPARISON:  Two-view chest x-ray 11/08/2013.  CTA chest 11/22/2012.  FINDINGS: Pulmonary arterial opacification is satisfactory. No focal filling defects are evident to suggest pulmonary emboli. The heart is mildly enlarged. Atherosclerotic calcifications are noted within the coronary arteries. The patient is status post aortic valve replacement. A tiny hiatal hernia is evident. No significant mediastinal or axillary adenopathy is present.  A 3 mm ground-glass nodule within the right middle lobe is  stable. Minimal dependent atelectasis is present. No other focal nodule, mass, or airspace disease is present.  The bone windows demonstrate previous median sternotomy. Vertebral body heights and alignment are maintained. No focal lytic or blastic lesions are evident.  Review of the MIP images confirms the above findings.  IMPRESSION: 1. No evidence for pulmonary embolus. 2. Stable ground-glass nodule in the right middle lobe. By report, this has been stable since 2010. 3. Status post aortic valve replacement.   Electronically Signed   By: Gennette Pac M.D.   On: 11/08/2013 17:02      MDM   Final diagnoses:  Exertional chest pain  Aortic stenosis  Atrial fibrillation  CAD (coronary artery disease)  Status post aortic valve replacement    Pt presenting with shortness of breath and chest pain on exertion which has been ongoing for the past several weeks.  No chest pain in ED.  Labs reveal normal troponin, elevated d-dimer.  EKG without acute findings.  CXR reassuring as well.  Pt will have ct angio of chest.  Have talked with cardiology, Rosann Auerbach, and they will come to the ED to evaluate patient  They were made aware of the patient earlier today via radiology    Ethelda Chick, MD 11/09/13 1158

## 2013-11-08 NOTE — ED Notes (Signed)
IV team returned page.  

## 2013-11-08 NOTE — H&P (Signed)
Primary cardiologist: Dr. Willa RoughJeffrey Katz  Clinical Summary Ms. Kelli Stone is an 80 y.o.female with cardiac history outlined below, referred to the ER earlier today secondary to chest discomfort and shortness of breath. She underwent bioprosthetic aortic valve replacement in April of last year, has generally done well, states that she began to feel a "tiredness" in her lower chest area back in December with activity such as walking up steps or ambulating in her house. This has been somewhat progressive and has limited her functional status. Today she came in to have a right knee injection for pain management, parked a long distance from the hospital, and by the time she got inside reported that she was significantly short of breath. This resulted in a call to our office and the patient was referred to the ER.  She reports compliance with her medications. States that recently she noted that her blood pressure was elevated, and also that her heart rate was in the "120 range" while at rest. She does have a history of postoperative atrial fibrillation, although not documented sense.  Outside echocardiogram from September 2014 reported mild LVH with LVEF 50-55%, possible mild posterior hypokinesis, diastolic dysfunction, mildly enlarged left atrium, mild to moderate mitral regurgitation with MAC and valve thickening. Aortic valve structure not well described in the report, in fact the scanned in document in EPIC cuts off the valve gradients.  Initial cardiac markers are normal, proBNP is minimally increased. CT angiogram of the chest showed no pulmonary embolus, stable groundglass nodular right middle lobe, unchanged since 2010.    Allergies  Allergen Reactions  . Shellfish Allergy Nausea Only and Other (See Comments)    Vomiting & diarrhea  . Bonine [Meclizine Hcl]     Nervous   . Lotrel [Amlodipine Besy-Benazepril Hcl]     Made legs  Ache   . Benadryl [Diphenhydramine Hcl] Other (See Comments)   nervous  . Benadryl [Diphenhydramine] Other (See Comments)    Sleepy and nervousness  . Cephalexin     Rxn: unknown   . Lactose Intolerance (Gi) Diarrhea  . Other Other (See Comments)    GUIATUSSIN. Unknown reaction  . Sulfa Antibiotics Other (See Comments)    unknown  . Zocor [Simvastatin - High Dose]     Aching   . Celebrex [Celecoxib] Other (See Comments)    Chest discomfort  . Dilacor Xr [Diltiazem Hcl] Other (See Comments)    Rxn: unknown  . Erythromycin Nausea Only  . Lescol Other (See Comments)    aching  . Metformin And Related Diarrhea  . Methylprednisolone Acetate Itching and Other (See Comments)  . Naprosyn [Naproxen] Nausea Only  . Penicillins Other (See Comments)    sleepy  . Stadol [Butorphanol Tartrate] Other (See Comments)    numb  . Trinalin [Azatadine-Pseudoephedrine] Other (See Comments)    nervous    Home Medications Aspirin 345 mg once daily Norvasc 5 mg once daily Astelin 137 mcg/spray once daily each nostril Valium 5 mg one half to one tablet 3 times a day as needed Vitamin D2 50,000 units once a week Neurontin 100 mg as needed Xalatan 0.005% eyedrops daily both eyes Lisinopril 40 mg once daily Protonix 40 mg once daily Timoptic 0.5% eyedrops both eyes daily Lopressor 25 mg once daily   Past Medical History  Diagnosis Date  . Glaucoma   . GERD (gastroesophageal reflux disease)   . Vertigo   . Type 2 diabetes mellitus     Diet controlled  . Essential  hypertension, benign   . Dyslipidemia   . Aortic stenosis     Severe - subsequent 21 mm Edwards pericardial valve on April 2014  . Coronary atherosclerosis of native coronary artery     Mild, catheterization, March, 2014, 30 and 40% lesions, nonobstructive, no further evaluation  . Ejection fraction     EF 60%, echo, March, 2014,  . Arthritis   . Atrial fibrillation     Perioperative atrial fibrillation, April, 2014, treated with amiodarone  . Hx of amiodarone therapy     Use for  perioperative atrial fibrillation, April, 2014, the dose is being weaned over time May, 2014  . Pre-syncope     June, 2014  . Skin tenderness     Anterior chest in the area of the keloid    Past Surgical History  Procedure Laterality Date  . Appendectomy    . Right shoulder    . Shoulder surgery    . Breast surgery  bilateral  . Vaginal hysterectomy    . Total knee arthroplasty  07/01/2011    Procedure: TOTAL KNEE ARTHROPLASTY;  Surgeon: Fuller Canada, MD;  Location: [facility];  Service: Orthopedics;  Laterality: Right;  Depuy  . Colonoscopy  05/07/2012    Procedure: COLONOSCOPY;  Surgeon: Malissa Hippo, MD;  Location: [facility];  Service: Endoscopy;  Laterality: N/A;  200  . Tee without cardioversion N/A 11/26/2012    Procedure: TRANSESOPHAGEAL ECHOCARDIOGRAM (TEE);  Surgeon: Laurey Morale, MD;  Location: [facility] ENDOSCOPY;  Service: Cardiovascular;  Laterality: N/A;  . Eye surgery Right     glaucoma  . Aortic valve replacement N/A 12/25/2012    Procedure: AORTIC VALVE REPLACEMENT (AVR);  Surgeon: Alleen Borne, MD;  Location: [facility] OR;  Service: Open Heart Surgery;  Laterality: N/A;  . Intraoperative transesophageal echocardiogram N/A 12/25/2012    Procedure: INTRAOPERATIVE TRANSESOPHAGEAL ECHOCARDIOGRAM;  Surgeon: Alleen Borne, MD;  Location: [facility] OR;  Service: Open Heart Surgery;  Laterality: N/A;    Family History  Problem Relation Age of Onset  . Cancer    . Diabetes    . Diabetes Mother   . Bone cancer Father   . Diabetes Sister   . Kidney disease Sister   . Stomach cancer Sister   . Stroke Brother   . Bone cancer Brother   . Healthy Son     Social History Ms. Encarnacion reports that she quit smoking about 30 years ago. Her smoking use included Cigarettes. She has a 3 pack-year smoking history. She has never used smokeless tobacco. Ms. Carlucci reports that she does not drink alcohol.  Review of Systems No sense of palpitations although she has noted that her heart rate has  been increased intermittently as noted above. Stable appetite. She has been having problems with right knee pain, had prior knee replacement, and did complete cardiac rehabilitation which he thinks may have exacerbated her knee discomfort. No falls. Otherwise as outlined above.  Physical Examination Temp:  [97.8 F (36.6 C)] 97.8 F (36.6 C) (02/23 1125) Pulse Rate:  [49-58] 58 (02/23 1830) Resp:  [10-21] 17 (02/23 1830) BP: (107-186)/(45-87) 168/72 mmHg (02/23 1830) SpO2:  [99 %-100 %] 99 % (02/23 1830) Weight:  [161 lb (73.029 kg)] 161 lb (73.029 kg) (02/23 1125) No intake or output data in the 24 hours ending 11/08/13 1930  Telemetry: Currently sinus rhythm in the 60s.  Overweight woman, comfortable at rest, no active chest pain. HEENT: Conjunctiva and lids normal, oropharynx clear. Neck: Supple,  no elevated JVP or carotid bruits, no thyromegaly. Lungs: Clear to auscultation, decreased breath sounds, nonlabored breathing at rest. Cardiac: Regular rate and rhythm, no S3, soft systolic murmur at base, no pericardial rub. Abdomen: Soft, nontender, bowel sounds present. Extremities: No pitting edema, distal pulses 2+. Skin: Warm and dry. Musculoskeletal: No kyphosis. Neuropsychiatric: Alert and oriented x3, affect grossly appropriate.   Lab Results  Basic Metabolic Panel:  Recent Labs Lab 11/08/13 1200  NA 144  K 4.3  CL 105  CO2 25  GLUCOSE 102*  BUN 14  CREATININE 1.30*  CALCIUM 9.5    CBC:  Recent Labs Lab 11/08/13 1200  WBC 6.0  HGB 12.0  HCT 35.0*  MCV 77.6*  PLT 293    Cardiac Enzymes: POC troponin I 0.00  Pro-BNP: 240  ECG Normal sinus rhythm with inferolateral ST-T wave abnormalities, somewhat more prominent compared to prior tracing from October 2014.  Imaging CT ANGIOGRAPHY CHEST WITH CONTRAST  TECHNIQUE:  Multidetector CT imaging of the chest was performed using the  standard protocol during bolus administration of intravenous  contrast.  Multiplanar CT image reconstructions and MIPs were  obtained to evaluate the vascular anatomy.  CONTRAST: OMNIPAQUE IOHEXOL 350 MG/ML SOLN  COMPARISON: Two-view chest x-ray 11/08/2013. CTA chest 11/22/2012.  FINDINGS:  Pulmonary arterial opacification is satisfactory. No focal filling  defects are evident to suggest pulmonary emboli. The heart is mildly  enlarged. Atherosclerotic calcifications are noted within the  coronary arteries. The patient is status post aortic valve  replacement. A tiny hiatal hernia is evident. No significant  mediastinal or axillary adenopathy is present.  A 3 mm ground-glass nodule within the right middle lobe is stable.  Minimal dependent atelectasis is present. No other focal nodule,  mass, or airspace disease is present.  The bone windows demonstrate previous median sternotomy. Vertebral  body heights and alignment are maintained. No focal lytic or blastic  lesions are evident.  Review of the MIP images confirms the above findings.  IMPRESSION:  1. No evidence for pulmonary embolus.  2. Stable ground-glass nodule in the right middle lobe. By report,  this has been stable since 2010.  3. Status post aortic valve replacement.   Impression  1. Somewhat progressive feeling of exertional "tiredness" in her lower chest area since December, significant episode today after walking a distance from the parking lot in the hospital for an elective right knee injection for pain control. ECG shows inferolateral ST-T wave abnormalities that are nonspecific, but have progressed compared to previous tracing in October 2014. Initial cardiac markers are negative and pro-BNP is only mildly increased. CT angiogram of the chest shows no pulmonary embolus. Followup outside echocardiogram from September 2014 was not adequate for assessment of her valvular status.  2. History of severe aortic stenosis status post 21 mm Edwards pericardial tissue valve replacement in April  2014.  3. Mild coronary atherosclerosis at cardiac catheterization in March 2014, no obstructive stenoses at that point.  4. History of postoperative atrial fibrillation, non-documented since that time. Patient does report recent elevations and heart rate of uncertain significance. Possibility of transient arrhythmia is to be considered.  5. Type 2 diabetes mellitus.   Recommendations  Discussed with patient and her husband at bedside. Will plan to admit her to the hospital for further evaluation. Follow telemetry for potential arrhythmias, cycle set of cardiac markers, and plan a followup complete echocardiogram for assessment of cardiac structure and function as well as prosthetic aortic valve. Whether she needs  further ischemic testing is not clear at this point. She had only mild coronary atherosclerosis in March of last year by catheterization. Further plans to follow pending above testing and assessment by our inpatient cardiology team.   Jonelle Sidle, M.D., F.A.C.C.

## 2013-11-08 NOTE — Progress Notes (Signed)
Patient ID: Kelli Stone, female   DOB: [birth year], 80 y.o.   MRN: [id number]   Patient presented for an epidural steroid injection today.  She is also complaining of new substernal chest pain and shortness of breath with minimal activity over the last week or so.  My staff contacted Dr. Henrietta HooverKatz's office.  Per the triage nurse, Dr. Mayford Knifeurner requested that the patient go to the emergency room.  I discussed this with the patient and her husband.  The understand and will [facility].  The steroid injection can be rescheduled after clearance by Cardiology.

## 2013-11-08 NOTE — Telephone Encounter (Signed)
[facility] stating pt has been having Cp, SOB , and tingling in her left arm down to her fingers for the past week. Pt is set to see Dr Myrtis SerKatz next week. Dr. Mayford Knifeurner is DOD and stated since pt is having active symptoms that they needed to call an ambulance and take pt to the hospital to be evaluated. [facility] voiced understanding and will send pt to hospital.    To Dr. Mayford Knifeurner to sign off on

## 2013-11-08 NOTE — Progress Notes (Signed)
Unit CM UR Completed by [facility] ED CM  W. Tanyiah Laurich RN  

## 2013-11-09 ENCOUNTER — Ambulatory Visit: Payer: Medicare Other | Admitting: Orthopedic Surgery

## 2013-11-09 DIAGNOSIS — E119 Type 2 diabetes mellitus without complications: Secondary | ICD-10-CM | POA: Diagnosis present

## 2013-11-09 DIAGNOSIS — I379 Nonrheumatic pulmonary valve disorder, unspecified: Secondary | ICD-10-CM

## 2013-11-09 LAB — CBC
HCT: 34.1 % — ABNORMAL LOW (ref 36.0–46.0)
Hemoglobin: 11.7 g/dL — ABNORMAL LOW (ref 12.0–15.0)
MCH: 26.7 pg (ref 26.0–34.0)
MCHC: 34.3 g/dL (ref 30.0–36.0)
MCV: 77.9 fL — ABNORMAL LOW (ref 78.0–100.0)
Platelets: 272 10*3/uL (ref 150–400)
RBC: 4.38 MIL/uL (ref 3.87–5.11)
RDW: 13.9 % (ref 11.5–15.5)
WBC: 6 10*3/uL (ref 4.0–10.5)

## 2013-11-09 LAB — HEMOGLOBIN A1C
HEMOGLOBIN A1C: 6.3 % — AB (ref <5.7)
Mean Plasma Glucose: 134 mg/dL — ABNORMAL HIGH (ref <117)

## 2013-11-09 LAB — BASIC METABOLIC PANEL
BUN: 14 mg/dL (ref 6–23)
CO2: 24 mEq/L (ref 19–32)
Calcium: 9.2 mg/dL (ref 8.4–10.5)
Chloride: 105 mEq/L (ref 96–112)
Creatinine, Ser: 1.15 mg/dL — ABNORMAL HIGH (ref 0.50–1.10)
GFR calc Af Amer: 51 mL/min — ABNORMAL LOW (ref >90)
GFR, EST NON AFRICAN AMERICAN: 44 mL/min — AB (ref >90)
Glucose, Bld: 98 mg/dL (ref 70–99)
POTASSIUM: 3.8 meq/L (ref 3.7–5.3)
Sodium: 142 mEq/L (ref 137–147)

## 2013-11-09 LAB — LIPID PANEL
CHOL/HDL RATIO: 5.9 ratio
CHOLESTEROL: 284 mg/dL — AB (ref 0–200)
HDL: 48 mg/dL (ref >39)
LDL CALC: 204 mg/dL — AB (ref 0–99)
Triglycerides: 161 mg/dL — ABNORMAL HIGH (ref <150)
VLDL: 32 mg/dL (ref 0–40)

## 2013-11-09 LAB — CREATININE, SERUM
CREATININE: 1.23 mg/dL — AB (ref 0.50–1.10)
GFR, EST AFRICAN AMERICAN: 47 mL/min — AB (ref >90)
GFR, EST NON AFRICAN AMERICAN: 40 mL/min — AB (ref >90)

## 2013-11-09 LAB — TSH: TSH: 2.01 u[IU]/mL (ref 0.350–4.500)

## 2013-11-09 LAB — TROPONIN I: Troponin I: <0.3 ng/mL (ref <0.30)

## 2013-11-09 MED ORDER — NITROGLYCERIN 0.4 MG SL SUBL: 0.4000 mg | SUBLINGUAL_TABLET | SUBLINGUAL | Status: AC | PRN

## 2013-11-09 MED ORDER — LATANOPROST 0.005 % OP SOLN
1.0000 [drp] | Freq: Every day | OPHTHALMIC | Status: DC
  Administered 2013-11-09: 1 [drp] via OPHTHALMIC
  Filled 2013-11-09: qty 2.5

## 2013-11-09 MED ORDER — TIMOLOL MALEATE 0.5 % OP SOLN
1.0000 [drp] | Freq: Every day | OPHTHALMIC | Status: DC
  Administered 2013-11-09: 1 [drp] via OPHTHALMIC

## 2013-11-09 NOTE — Progress Notes (Signed)
No evidence of MI. We will allow to eat, get echo and discharge later today.

## 2013-11-09 NOTE — Discharge Instructions (Signed)
Chest Pain (Nonspecific) °It is often hard to give a specific diagnosis for the cause of chest pain. There is always a chance that your pain could be related to something serious, such as a heart attack or a blood clot in the lungs. You need to follow up with your caregiver for further evaluation. °CAUSES  °· Heartburn. °· Pneumonia or bronchitis. °· Anxiety or stress. °· Inflammation around your heart (pericarditis) or lung (pleuritis or pleurisy). °· A blood clot in the lung. °· A collapsed lung (pneumothorax). It can develop suddenly on its own (spontaneous pneumothorax) or from injury (trauma) to the chest. °· Shingles infection (herpes zoster virus). °The chest wall is composed of bones, muscles, and cartilage. Any of these can be the source of the pain. °· The bones can be bruised by injury. °· The muscles or cartilage can be strained by coughing or overwork. °· The cartilage can be affected by inflammation and become sore (costochondritis). °DIAGNOSIS  °Lab tests or other studies, such as X-rays, electrocardiography, stress testing, or cardiac imaging, may be needed to find the cause of your pain.  °TREATMENT  °· Treatment depends on what may be causing your chest pain. Treatment may include: °· Acid blockers for heartburn. °· Anti-inflammatory medicine. °· Pain medicine for inflammatory conditions. °· Antibiotics if an infection is present. °· You may be advised to change lifestyle habits. This includes stopping smoking and avoiding alcohol, caffeine, and chocolate. °· You may be advised to keep your head raised (elevated) when sleeping. This reduces the chance of acid going backward from your stomach into your esophagus. °· Most of the time, nonspecific chest pain will improve within 2 to 3 days with rest and mild pain medicine. °HOME CARE INSTRUCTIONS  °· If antibiotics were prescribed, take your antibiotics as directed. Finish them even if you start to feel better. °· For the next few days, avoid physical  activities that bring on chest pain. Continue physical activities as directed. °· Do not smoke. °· Avoid drinking alcohol. °· Only take over-the-counter or prescription medicine for pain, discomfort, or fever as directed by your caregiver. °· Follow your caregiver's suggestions for further testing if your chest pain does not go away. °· Keep any follow-up appointments you made. If you do not go to an appointment, you could develop lasting (chronic) problems with pain. If there is any problem keeping an appointment, you must call to reschedule. °SEEK MEDICAL CARE IF:  °· You think you are having problems from the medicine you are taking. Read your medicine instructions carefully. °· Your chest pain does not go away, even after treatment. °· You develop a rash with blisters on your chest. °SEEK IMMEDIATE MEDICAL CARE IF:  °· You have increased chest pain or pain that spreads to your arm, neck, jaw, back, or abdomen. °· You develop shortness of breath, an increasing cough, or you are coughing up blood. °· You have severe back or abdominal pain, feel nauseous, or vomit. °· You develop severe weakness, fainting, or chills. °· You have a fever. °THIS IS AN EMERGENCY. Do not wait to see if the pain will go away. Get medical help at once. Call your local emergency services (911 in [place] Do not drive yourself to the hospital. °MAKE SURE YOU:  °· Understand these instructions. °· Will watch your condition. °· Will get help right away if you are not doing well or get worse. °Document Released: 06/12/2005 Document Revised: 11/25/2011 Document Reviewed: 04/07/2008 °[employer]® Patient Information ©2014 [employer]. ° °

## 2013-11-09 NOTE — Discharge Summary (Addendum)
Patient ID: Kelli Stone,  MRN: [id number], DOB/AGE: 03/08/1931 80 y.o.  Admit date: 11/08/2013 Discharge date: 11/09/2013  Primary Care Provider: Dr Jerene Bears Primary Cardiologist: Dr Ron Parker  Discharge Diagnoses Principal Problem:   Exertional chest pain Active Problems:   Dyspnea on exertion   Aortic stenosis- tissue AVR April 2014   CAD- mild at cath 3/14   HTN (hypertension)   Dyslipidemia   Pre-syncope- negative Event June 2014   Diabetes mellitus type 2, diet-controlled    Procedures: Echo 11/09/13 Study Conclusions  - Left ventricle: The cavity size was normal. Wall thickness was increased in a pattern of mild LVH. There was mild concentric hypertrophy. Systolic function was normal. The estimated ejection fraction was in the range of 55% to 60%. Wall motion was normal; there were no regional wall motion abnormalities. - Aortic valve: A bioprosthesis was present.   Hospital Course:  80 y/o female with a history of AS, s/p tissue AVR April 2014. She has normal LVF by echo. She had pre syncope in June 2014 and had a negative Event monitor. She was admitted to the hospital 11/08/13 with dyspnea and vague chest pain after she went for an knee injection. She ruled out for an MI. She had no arrythmia on telemetry. BNP was 240. She had a slightly elevated D-dimer, CTA was negative for PE with stable RML nodule. An echo was done and was unremarkable. She had minor CAD at cath in March 2014. Dr Tamala Julian felt she could go home and have an OP Myoview. She has an appointment with Dr Ron Parker March 3d in [place] and she will keep that.   Discharge Vitals:  Blood pressure 163/77, pulse 91, temperature 98 F (36.7 C), temperature source Oral, resp. rate 18, height $RemoveBe'5\' 3"'cdfiXajOf$  (1.6 m), weight 158 lb 11.2 oz (71.986 kg), SpO2 99.00%.  No rub  Labs: Results for orders placed during the hospital encounter of 11/08/13 (from the past 48 hour(s))  CBC     Status: Abnormal   Collection Time    11/08/13  12:00 PM      Result Value Ref Range   WBC 6.0  4.0 - 10.5 K/uL   RBC 4.51  3.87 - 5.11 MIL/uL   Hemoglobin 12.0  12.0 - 15.0 g/dL   HCT 35.0 (*) 36.0 - 46.0 %   MCV 77.6 (*) 78.0 - 100.0 fL   MCH 26.6  26.0 - 34.0 pg   MCHC 34.3  30.0 - 36.0 g/dL   RDW 13.8  11.5 - 15.5 %   Platelets 293  150 - 400 K/uL  BASIC METABOLIC PANEL     Status: Abnormal   Collection Time    11/08/13 12:00 PM      Result Value Ref Range   Sodium 144  137 - 147 mEq/L   Potassium 4.3  3.7 - 5.3 mEq/L   Chloride 105  96 - 112 mEq/L   CO2 25  19 - 32 mEq/L   Glucose, Bld 102 (*) 70 - 99 mg/dL   BUN 14  6 - 23 mg/dL   Creatinine, Ser 1.30 (*) 0.50 - 1.10 mg/dL   Calcium 9.5  8.4 - 10.5 mg/dL   GFR calc non Af Amer 38 (*) >90 mL/min   GFR calc Af Amer 44 (*) >90 mL/min   Comment: (NOTE)     The eGFR has been calculated using the CKD EPI equation.     This calculation has not been validated in  all clinical situations.     eGFR's persistently <90 mL/min signify possible Chronic Kidney     Disease.  PRO B NATRIURETIC PEPTIDE     Status: None   Collection Time    11/08/13 12:00 PM      Result Value Ref Range   Pro B Natriuretic peptide (BNP) 240.0  0 - 450 pg/mL  D-DIMER, QUANTITATIVE     Status: Abnormal   Collection Time    11/08/13 12:00 PM      Result Value Ref Range   D-Dimer, Quant 0.87 (*) 0.00 - 0.48 ug/mL-FEU   Comment:            AT THE INHOUSE ESTABLISHED CUTOFF     VALUE OF 0.48 ug/mL FEU,     THIS ASSAY HAS BEEN DOCUMENTED     IN THE LITERATURE TO HAVE     A SENSITIVITY AND NEGATIVE     PREDICTIVE VALUE OF AT LEAST     98 TO 99%.  THE TEST RESULT     SHOULD BE CORRELATED WITH     AN ASSESSMENT OF THE CLINICAL     PROBABILITY OF DVT / VTE.  Randolm Idol, ED     Status: None   Collection Time    11/08/13 12:27 PM      Result Value Ref Range   Troponin i, poc 0.00  0.00 - 0.08 ng/mL   Comment 3            Comment: Due to the release kinetics of cTnI,     a negative result within  the first hours     of the onset of symptoms does not rule out     myocardial infarction with certainty.     If myocardial infarction is still suspected,     repeat the test at appropriate intervals.  TROPONIN I     Status: None   Collection Time    11/08/13 11:32 PM      Result Value Ref Range   Troponin I <0.30  <0.30 ng/mL   Comment:            Due to the release kinetics of cTnI,     a negative result within the first hours     of the onset of symptoms does not rule out     myocardial infarction with certainty.     If myocardial infarction is still suspected,     repeat the test at appropriate intervals.  HEMOGLOBIN A1C     Status: Abnormal   Collection Time    11/08/13 11:32 PM      Result Value Ref Range   Hemoglobin A1C 6.3 (*) <5.7 %   Comment: (NOTE)                                                                               According to the ADA Clinical Practice Recommendations for 2011, when     HbA1c is used as a screening test:      >=6.5%   Diagnostic of Diabetes Mellitus               (if abnormal result is confirmed)  5.7-6.4%   Increased risk of developing Diabetes Mellitus     References:Diagnosis and Classification of Diabetes Mellitus,Diabetes     OBSJ,6283,66(QHUTM 1):S62-S69 and Standards of Medical Care in             Diabetes - 2011,Diabetes LYYT,0354,65 (Suppl 1):S11-S61.   Mean Plasma Glucose 134 (*) <117 mg/dL   Comment: Performed at [facility]  TSH     Status: None   Collection Time    11/08/13 11:32 PM      Result Value Ref Range   TSH 2.010  0.350 - 4.500 uIU/mL   Comment: Performed at [facility]  CBC     Status: Abnormal   Collection Time    11/08/13 11:32 PM      Result Value Ref Range   WBC 6.0  4.0 - 10.5 K/uL   RBC 4.38  3.87 - 5.11 MIL/uL   Hemoglobin 11.7 (*) 12.0 - 15.0 g/dL   HCT 34.1 (*) 36.0 - 46.0 %   MCV 77.9 (*) 78.0 - 100.0 fL   MCH 26.7  26.0 - 34.0 pg   MCHC 34.3  30.0 - 36.0 g/dL   RDW 13.9   11.5 - 15.5 %   Platelets 272  150 - 400 K/uL  CREATININE, SERUM     Status: Abnormal   Collection Time    11/08/13 11:32 PM      Result Value Ref Range   Creatinine, Ser 1.23 (*) 0.50 - 1.10 mg/dL   GFR calc non Af Amer 40 (*) >90 mL/min   GFR calc Af Amer 47 (*) >90 mL/min   Comment: (NOTE)     The eGFR has been calculated using the CKD EPI equation.     This calculation has not been validated in all clinical situations.     eGFR's persistently <90 mL/min signify possible Chronic Kidney     Disease.  TROPONIN I     Status: None   Collection Time    11/09/13  5:15 AM      Result Value Ref Range   Troponin I <0.30  <0.30 ng/mL   Comment:            Due to the release kinetics of cTnI,     a negative result within the first hours     of the onset of symptoms does not rule out     myocardial infarction with certainty.     If myocardial infarction is still suspected,     repeat the test at appropriate intervals.  LIPID PANEL     Status: Abnormal   Collection Time    11/09/13  5:15 AM      Result Value Ref Range   Cholesterol 284 (*) 0 - 200 mg/dL   Triglycerides 161 (*) <150 mg/dL   HDL 48  >39 mg/dL   Total CHOL/HDL Ratio 5.9     VLDL 32  0 - 40 mg/dL   LDL Cholesterol 204 (*) 0 - 99 mg/dL   Comment:            Total Cholesterol/HDL:CHD Risk     Coronary Heart Disease Risk Table                         Men   Women      1/2 Average Risk   3.4   3.3      Average Risk       5.0  4.4      2 X Average Risk   9.6   7.1      3 X Average Risk  23.4   11.0                Use the calculated Patient Ratio     above and the CHD Risk Table     to determine the patient's CHD Risk.                ATP III CLASSIFICATION (LDL):      <100     mg/dL   Optimal      100-129  mg/dL   Near or Above                        Optimal      130-159  mg/dL   Borderline      160-189  mg/dL   High      >190     mg/dL   Very High  BASIC METABOLIC PANEL     Status: Abnormal   Collection Time     11/09/13  5:15 AM      Result Value Ref Range   Sodium 142  137 - 147 mEq/L   Potassium 3.8  3.7 - 5.3 mEq/L   Chloride 105  96 - 112 mEq/L   CO2 24  19 - 32 mEq/L   Glucose, Bld 98  70 - 99 mg/dL   BUN 14  6 - 23 mg/dL   Creatinine, Ser 1.15 (*) 0.50 - 1.10 mg/dL   Calcium 9.2  8.4 - 10.5 mg/dL   GFR calc non Af Amer 44 (*) >90 mL/min   GFR calc Af Amer 51 (*) >90 mL/min   Comment: (NOTE)     The eGFR has been calculated using the CKD EPI equation.     This calculation has not been validated in all clinical situations.     eGFR's persistently <90 mL/min signify possible Chronic Kidney     Disease.  TROPONIN I     Status: None   Collection Time    11/09/13 11:05 AM      Result Value Ref Range   Troponin I <0.30  <0.30 ng/mL   Comment:            Due to the release kinetics of cTnI,     a negative result within the first hours     of the onset of symptoms does not rule out     myocardial infarction with certainty.     If myocardial infarction is still suspected,     repeat the test at appropriate intervals.    Disposition:      Follow-up Information   Follow up with Dola Argyle, MD On 11/16/2013. (Keep you 9:30 appointment. Office will call about stress test)    Specialty:  Cardiology   Contact information:   [place] [address] [phone]       Discharge Medications:    Medication List    STOP taking these medications       metoprolol tartrate 25 MG tablet  Commonly known as:  LOPRESSOR      TAKE these medications       acetaminophen 500 MG tablet  Commonly known as:  TYLENOL  Take 500-1,000 mg by mouth daily as needed (for leg pain).     amLODipine 5 MG tablet  Commonly known as:  NORVASC  Take 5 mg by mouth daily as needed (for high blood pressure (sbp>180/90)).     aspirin 325 MG EC tablet  Take 325 mg by mouth daily.     azelastine 137 MCG/SPRAY nasal spray  Commonly known as:  ASTELIN  Place 1 spray into both nostrils daily. Use in  each nostril as directed     diazepam 5 MG tablet  Commonly known as:  VALIUM  Take 2.5-5 mg by mouth 3 (three) times daily as needed (for vertigo).     ergocalciferol 50000 UNITS capsule  Commonly known as:  VITAMIN D2  Take 50,000 Units by mouth once a week. Take on wednesdays     gabapentin 100 MG capsule  Commonly known as:  NEURONTIN  Take 100 mg by mouth daily as needed (for  pain).     latanoprost 0.005 % ophthalmic solution  Commonly known as:  XALATAN  Place 1 drop into both eyes daily.     lisinopril 40 MG tablet  Commonly known as:  PRINIVIL,ZESTRIL  Take 40 mg by mouth daily.     nitroGLYCERIN 0.4 MG SL tablet  Commonly known as:  NITROSTAT  Place 1 tablet (0.4 mg total) under the tongue every 5 (five) minutes x 3 doses as needed for chest pain (take one tablet under your tongue if needed fro chest pain, pressure, or tightness).     pantoprazole 40 MG tablet  Commonly known as:  PROTONIX  Take 40 mg by mouth daily.     timolol 0.5 % ophthalmic solution  Commonly known as:  TIMOPTIC  Place 1 drop into both eyes daily.         Duration of Discharge Encounter: Greater than 30 minutes including physician time.  Signed, Kerin Ransom PA-C 11/09/2013 1:52 PM  Agree with the note and the plan outlined above.   Illene Labrador, III, MD

## 2013-11-09 NOTE — Progress Notes (Signed)
  Echocardiogram 2D Echocardiogram has been performed.  Cathie BeamsGREGORY, Janda Cargo 11/09/2013, 1:59 PM

## 2013-11-10 NOTE — Progress Notes (Signed)
Assessment unchanged. Discussed D/C instructions with pt including medications and f/u appointments. Verbalized understanding. IV and tele removed. Pt left with belongings accompanied by RN.

## 2013-11-15 ENCOUNTER — Encounter: Payer: Self-pay | Admitting: Cardiology

## 2013-11-16 ENCOUNTER — Ambulatory Visit (INDEPENDENT_AMBULATORY_CARE_PROVIDER_SITE_OTHER): Payer: Medicare Other | Admitting: Cardiology

## 2013-11-16 ENCOUNTER — Encounter: Payer: Self-pay | Admitting: Cardiology

## 2013-11-16 VITALS — BP 181/75 | HR 57 | Ht 63.0 in | Wt 164.8 lb

## 2013-11-16 DIAGNOSIS — I359 Nonrheumatic aortic valve disorder, unspecified: Secondary | ICD-10-CM

## 2013-11-16 DIAGNOSIS — R55 Syncope and collapse: Secondary | ICD-10-CM

## 2013-11-16 DIAGNOSIS — I350 Nonrheumatic aortic (valve) stenosis: Secondary | ICD-10-CM

## 2013-11-16 DIAGNOSIS — R9430 Abnormal result of cardiovascular function study, unspecified: Secondary | ICD-10-CM

## 2013-11-16 DIAGNOSIS — R0989 Other specified symptoms and signs involving the circulatory and respiratory systems: Secondary | ICD-10-CM

## 2013-11-16 DIAGNOSIS — R42 Dizziness and giddiness: Secondary | ICD-10-CM

## 2013-11-16 DIAGNOSIS — D649 Anemia, unspecified: Secondary | ICD-10-CM

## 2013-11-16 DIAGNOSIS — I10 Essential (primary) hypertension: Secondary | ICD-10-CM

## 2013-11-16 DIAGNOSIS — I2510 Atherosclerotic heart disease of native coronary artery without angina pectoris: Secondary | ICD-10-CM

## 2013-11-16 MED ORDER — ASPIRIN EC 81 MG PO TBEC: 81.0000 mg | DELAYED_RELEASE_TABLET | Freq: Every day | ORAL | Status: DC

## 2013-11-16 MED ORDER — AMLODIPINE BESYLATE 2.5 MG PO TABS: 2.5000 mg | ORAL_TABLET | Freq: Every day | ORAL | Status: AC

## 2013-11-16 MED ORDER — ASPIRIN EC 81 MG PO TBEC: 81.0000 mg | DELAYED_RELEASE_TABLET | Freq: Every day | ORAL | Status: AC

## 2013-11-16 MED ORDER — MECLIZINE HCL 12.5 MG PO TABS: 12.5000 mg | ORAL_TABLET | Freq: Three times a day (TID) | ORAL | Status: AC | PRN

## 2013-11-16 NOTE — Assessment & Plan Note (Signed)
Her tissue aortic valve prosthesis is working well by very recent echo. No further workup.

## 2013-11-16 NOTE — Patient Instructions (Addendum)
Your physician recommends that you schedule a follow-up appointment in: 3-4 weeks. Your physician has recommended you make the following change in your medication:  Decrease aspirin to 81 mg daily. Start amlodipine 2.5 mg daily. Start meclizine 12.5 mg by mouth three times daily as needed for dizziness. All other medications will remain the same.

## 2013-11-16 NOTE — Assessment & Plan Note (Signed)
The patient had some presyncope in 2014. No arrhythmia was noted. We have been careful not to lower her blood pressure too much.

## 2013-11-16 NOTE — Progress Notes (Signed)
HPI   Patient is seen today for followup of mild coronary disease and aortic valvular disease. She is also seen to followup her recent hospitalization at [facility] on November 10, 2013. At that time she was admitted with some chest discomfort. Her pain was felt not to be cardiac in origin. She did have a followup 2-D echo revealing ejection fraction of 60%. Her tissue aortic valve prosthesis was working well. She was discharged home. There was possible consideration of a followup outpatient stress test.  Since being at home she's been stable. She has vague abdominal discomfort that I feel is not cardiac.  As part of today's evaluation I carefully reviewed the hospital records related to her recent hospitalization.  The patient is concerned that she has continued elevated blood pressure. Allergies  Allergen Reactions  . Shellfish Allergy Nausea Only and Other (See Comments)    Vomiting & diarrhea  . Bonine [Meclizine Hcl]     Nervous   . Lotrel [Amlodipine Besy-Benazepril Hcl]     Made legs  Ache   . Benadryl [Diphenhydramine Hcl] Other (See Comments)    nervous  . Benadryl [Diphenhydramine] Other (See Comments)    Sleepy and nervousness  . Cephalexin     Rxn: unknown   . Lactose Intolerance (Gi) Diarrhea  . Other Other (See Comments)    GUIATUSSIN. Unknown reaction  . Sulfa Antibiotics Other (See Comments)    unknown  . Zocor [Simvastatin - High Dose]     Aching   . Celebrex [Celecoxib] Other (See Comments)    Chest discomfort  . Dilacor Xr [Diltiazem Hcl] Other (See Comments)    Rxn: unknown  . Erythromycin Nausea Only  . Lescol Other (See Comments)    aching  . Metformin And Related Diarrhea  . Methylprednisolone Acetate Itching and Other (See Comments)  . Naprosyn [Naproxen] Nausea Only  . Penicillins Other (See Comments)    sleepy  . Stadol [Butorphanol Tartrate] Other (See Comments)    numb  . Trinalin [Azatadine-Pseudoephedrine] Other (See Comments)   nervous    Current Outpatient Prescriptions  Medication Sig Dispense Refill  . acetaminophen (TYLENOL) 500 MG tablet Take 500-1,000 mg by mouth daily as needed (for leg pain).      Marland Kitchen amLODipine (NORVASC) 5 MG tablet Take 5 mg by mouth daily as needed (for high blood pressure (sbp>180/90)).       Marland Kitchen aspirin 325 MG EC tablet Take 325 mg by mouth daily.      Marland Kitchen azelastine (ASTELIN) 137 MCG/SPRAY nasal spray Place 1 spray into both nostrils daily. Use in each nostril as directed      . diazepam (VALIUM) 5 MG tablet Take 2.5-5 mg by mouth 3 (three) times daily as needed (for vertigo).       Marland Kitchen ergocalciferol (VITAMIN D2) 50000 UNITS capsule Take 50,000 Units by mouth once a week. Take on wednesdays      . gabapentin (NEURONTIN) 100 MG capsule Take 100 mg by mouth daily as needed (for  pain).      Marland Kitchen latanoprost (XALATAN) 0.005 % ophthalmic solution Place 1 drop into both eyes daily.       Marland Kitchen lisinopril (PRINIVIL,ZESTRIL) 40 MG tablet Take 40 mg by mouth daily.       . nitroGLYCERIN (NITROSTAT) 0.4 MG SL tablet Place 1 tablet (0.4 mg total) under the tongue every 5 (five) minutes x 3 doses as needed for chest pain (take one tablet under your tongue if needed fro  chest pain, pressure, or tightness).  25 tablet  2  . pantoprazole (PROTONIX) 40 MG tablet Take 40 mg by mouth daily.      . timolol (TIMOPTIC) 0.5 % ophthalmic solution Place 1 drop into both eyes daily.       No current facility-administered medications for this visit.    History   Social History  . Marital Status: Married    Spouse Name: N/A    Number of Children: N/A  . Years of Education: 11th grade   Occupational History  . Not on file.   Social History Main Topics  . Smoking status: Former Smoker -- 0.20 packs/day for 15 years    Types: Cigarettes    Quit date: 09/17/1983  . Smokeless tobacco: Never Used  . Alcohol Use: No  . Drug Use: No  . Sexual Activity: No   Other Topics Concern  . Not on file   Social History  Narrative  . No narrative on file    Family History  Problem Relation Age of Onset  . Cancer    . Diabetes    . Diabetes Mother   . Bone cancer Father   . Diabetes Sister   . Kidney disease Sister   . Stomach cancer Sister   . Stroke Brother   . Bone cancer Brother   . Healthy Son     Past Medical History  Diagnosis Date  . Glaucoma   . GERD (gastroesophageal reflux disease)   . Vertigo   . Type 2 diabetes mellitus     Diet controlled  . Essential hypertension, benign   . Dyslipidemia   . Aortic stenosis     Severe - subsequent 21 mm Edwards pericardial valve on April 2014  . Coronary atherosclerosis of native coronary artery     Mild, catheterization, March, 2014, 30 and 40% lesions, nonobstructive, no further evaluation  . Ejection fraction     EF 60%, echo, March, 2014,  . Arthritis   . Atrial fibrillation     Perioperative atrial fibrillation, April, 2014, treated with amiodarone  . Hx of amiodarone therapy     Use for perioperative atrial fibrillation, April, 2014, the dose is being weaned over time May, 2014  . Pre-syncope     June, 2014  . Skin tenderness     Anterior chest in the area of the keloid    Past Surgical History  Procedure Laterality Date  . Appendectomy    . Right shoulder    . Shoulder surgery    . Breast surgery  bilateral  . Vaginal hysterectomy    . Total knee arthroplasty  07/01/2011    Procedure: TOTAL KNEE ARTHROPLASTY;  Surgeon: Fuller Canada, MD;  Location: [facility] ORS;  Service: Orthopedics;  Laterality: Right;  Depuy  . Colonoscopy  05/07/2012    Procedure: COLONOSCOPY;  Surgeon: Malissa Hippo, MD;  Location: [facility] ENDO SUITE;  Service: Endoscopy;  Laterality: N/A;  200  . Tee without cardioversion N/A 11/26/2012    Procedure: TRANSESOPHAGEAL ECHOCARDIOGRAM (TEE);  Surgeon: Laurey Morale, MD;  Location: [facility] ENDOSCOPY;  Service: Cardiovascular;  Laterality: N/A;  . Eye surgery Right     glaucoma  . Aortic valve replacement N/A  12/25/2012    Procedure: AORTIC VALVE REPLACEMENT (AVR);  Surgeon: Alleen Borne, MD;  Location[facility] OR;  Service: Open Heart Surgery;  Laterality: N/A;  . Intraoperative transesophageal echocardiogram N/A 12/25/2012    Procedure: INTRAOPERATIVE TRANSESOPHAGEAL ECHOCARDIOGRAM;  Surgeon: Alleen Borne, MD;  Location: [facility] OR;  Service: Open Heart Surgery;  Laterality: N/A;    Patient Active Problem List   Diagnosis Date Noted  . Diabetes mellitus type 2, diet-controlled 11/09/2013  . Exertional chest pain 11/08/2013  . Dyspnea on exertion 11/08/2013  . Chest wall injury 07/02/2013  . Osteoarthritis of right knee 05/18/2013  . Radicular syndrome of right leg 05/18/2013  . Tendinitis of knee 04/22/2013  . Trigger thumb of right hand 04/22/2013  . Pre-syncope- negative Event June 2014   . Skin tenderness   . Atrial fibrillation   . Hx of amiodarone therapy   . Status post aortic valve replacement   . Dyslipidemia   . Aortic stenosis- tissue AVR April 2014   . Carotid artery disease   . CAD- mild at cath 3/14   . Dyspnea   . Ejection fraction   . Diverticulitis of sigmoid colon 08/10/2012  . Patellar tendonitis 07/08/2012  . Anemia 04/13/2012  . Spinal stenosis 11/13/2011  . Neuropathy of leg 10/02/2011  . DVT (deep venous thrombosis) 09/19/2011  . Bursitis of shoulder, right 08/15/2011  . Difficulty in walking 08/07/2011  . S/P total knee replacement 07/15/2011  . Arthritis of knee, right 06/12/2011  . Constipated 06/12/2011  . OA (osteoarthritis) of knee 04/11/2011  . HTN (hypertension)     ROS   Patient denies fever, chills, headache, sweats, rash, change in vision, change in hearing, cough, nausea vomiting, urinary symptoms. All other systems are reviewed and are negative.  PHYSICAL EXAM  Patient is here today with her husband. She is oriented to person time and place. Affect is normal. There is no jugulovenous distention. Lungs are clear. Respiratory effort is not labored.  Cardiac exam reveals a soft outflow murmur. The abdomen is soft. There is no peripheral edema. There no musculoskeletal deformities. There are no skin rashes.  Filed Vitals:   11/16/13 0953 11/16/13 0958  BP: 173/76 181/75  Pulse: 57 57  Height: 5\' 3"  (1.6 m)   Weight: 164 lb 12.8 oz (74.753 kg)   SpO2: 100%     ASSESSMENT & PLAN

## 2013-11-16 NOTE — Assessment & Plan Note (Signed)
The patient is having continued systolic hypertension. She had been on some amlodipine in the past. I will restart a small dose.

## 2013-11-16 NOTE — Assessment & Plan Note (Signed)
Coronary disease was very mild by catheterization in March, 2015. She had recent chest pain in the hospital but no evidence of ischemia. I've chosen not to proceed with a stress study at this time.

## 2013-11-16 NOTE — Assessment & Plan Note (Signed)
Patient says that she's had some vertigo that is treated with Valium. We will try to research to see if meclizine can be used instead.  As part of today's evaluation I spent greater than 25 minutes with her total care. More than half of this time is spent with direct contact with the patient talking about her hospitalization and her chest pain in her vertigo and other symptoms.

## 2013-11-16 NOTE — Assessment & Plan Note (Signed)
The patient had been anemic before her heart surgery. Her most recent hemoglobin in the hospital was 12.0. No further workup.

## 2013-12-03 ENCOUNTER — Ambulatory Visit (INDEPENDENT_AMBULATORY_CARE_PROVIDER_SITE_OTHER): Payer: Medicare Other | Admitting: Cardiology

## 2013-12-03 ENCOUNTER — Encounter: Payer: Self-pay | Admitting: Cardiology

## 2013-12-03 ENCOUNTER — Ambulatory Visit: Payer: Medicare Other | Admitting: Cardiology

## 2013-12-03 VITALS — BP 156/90 | HR 64 | Ht 63.0 in | Wt 164.8 lb

## 2013-12-03 DIAGNOSIS — I10 Essential (primary) hypertension: Secondary | ICD-10-CM

## 2013-12-03 DIAGNOSIS — I2510 Atherosclerotic heart disease of native coronary artery without angina pectoris: Secondary | ICD-10-CM

## 2013-12-03 NOTE — Patient Instructions (Signed)

## 2013-12-03 NOTE — Assessment & Plan Note (Signed)
Blood pressures under better control with a small dose of amlodipine. I feel that she should remain on this. She does have some older 5 mg tablets. She can use these by cutting them in half.

## 2013-12-03 NOTE — Progress Notes (Signed)
Patient ID: Kelli Stone, female   DOB: 17-Mar-1931, 80 y.o.   MRN: [id number]    HPI Patient is seen today to followup aortic valvular disease and hypertension. When I saw her last November 16, 2013 I decided that he had back 2.5 mg of amlodipine. Her pressure is better today. She says that she has periods of feeling sleepy. I told her that this would not be the basis for changing her meds.  Allergies  Allergen Reactions  . Shellfish Allergy Nausea Only and Other (See Comments)    Vomiting & diarrhea  . Bonine [Meclizine Hcl]     Nervous   . Lotrel [Amlodipine Besy-Benazepril Hcl]     Made legs  Ache   . Benadryl [Diphenhydramine Hcl] Other (See Comments)    nervous  . Benadryl [Diphenhydramine] Other (See Comments)    Sleepy and nervousness  . Cephalexin     Rxn: unknown   . Lactose Intolerance (Gi) Diarrhea  . Other Other (See Comments)    GUIATUSSIN. Unknown reaction  . Sulfa Antibiotics Other (See Comments)    unknown  . Zocor [Simvastatin - High Dose]     Aching   . Celebrex [Celecoxib] Other (See Comments)    Chest discomfort  . Dilacor Xr [Diltiazem Hcl] Other (See Comments)    Rxn: unknown  . Erythromycin Nausea Only  . Lescol Other (See Comments)    aching  . Metformin And Related Diarrhea  . Methylprednisolone Acetate Itching and Other (See Comments)  . Naprosyn [Naproxen] Nausea Only  . Penicillins Other (See Comments)    sleepy  . Stadol [Butorphanol Tartrate] Other (See Comments)    numb  . Trinalin [Azatadine-Pseudoephedrine] Other (See Comments)    nervous    Current Outpatient Prescriptions  Medication Sig Dispense Refill  . acetaminophen (TYLENOL) 500 MG tablet Take 500-1,000 mg by mouth daily as needed (for leg pain).      Marland Kitchen amLODipine (NORVASC) 2.5 MG tablet Take 1 tablet (2.5 mg total) by mouth daily.  90 tablet  3  . aspirin EC 81 MG tablet Take 1 tablet (81 mg total) by mouth daily.  90 tablet  3  . azelastine (ASTELIN) 137 MCG/SPRAY nasal spray  Place 1 spray into both nostrils daily. Use in each nostril as directed      . diazepam (VALIUM) 5 MG tablet Take 2.5-5 mg by mouth 3 (three) times daily as needed (for vertigo).       Marland Kitchen ergocalciferol (VITAMIN D2) 50000 UNITS capsule Take 50,000 Units by mouth once a week. Take on wednesdays      . gabapentin (NEURONTIN) 100 MG capsule Take 100 mg by mouth daily as needed (for  pain).      Marland Kitchen HYDROcodone-acetaminophen (NORCO) 7.5-325 MG per tablet Take 0.5 tablets by mouth as needed.      . latanoprost (XALATAN) 0.005 % ophthalmic solution Place 1 drop into both eyes daily.       Marland Kitchen lisinopril (PRINIVIL,ZESTRIL) 40 MG tablet Take 40 mg by mouth daily.       . meclizine (ANTIVERT) 12.5 MG tablet Take 1 tablet (12.5 mg total) by mouth 3 (three) times daily as needed for dizziness.  30 tablet  0  . nitroGLYCERIN (NITROSTAT) 0.4 MG SL tablet Place 1 tablet (0.4 mg total) under the tongue every 5 (five) minutes x 3 doses as needed for chest pain (take one tablet under your tongue if needed fro chest pain, pressure, or tightness).  25 tablet  2  .  pantoprazole (PROTONIX) 40 MG tablet Take 40 mg by mouth daily.      . timolol (TIMOPTIC) 0.5 % ophthalmic solution Place 1 drop into both eyes daily.       No current facility-administered medications for this visit.    History   Social History  . Marital Status: Married    Spouse Name: N/A    Number of Children: N/A  . Years of Education: 11th grade   Occupational History  . Not on file.   Social History Main Topics  . Smoking status: Former Smoker -- 0.20 packs/day for 15 years    Types: Cigarettes    Quit date: 09/17/1983  . Smokeless tobacco: Never Used  . Alcohol Use: No  . Drug Use: No  . Sexual Activity: No   Other Topics Concern  . Not on file   Social History Narrative  . No narrative on file    Family History  Problem Relation Age of Onset  . Cancer    . Diabetes    . Diabetes Mother   . Bone cancer Father   . Diabetes  Sister   . Kidney disease Sister   . Stomach cancer Sister   . Stroke Brother   . Bone cancer Brother   . Healthy Son     Past Medical History  Diagnosis Date  . Glaucoma   . GERD (gastroesophageal reflux disease)   . Vertigo   . Type 2 diabetes mellitus     Diet controlled  . Essential hypertension, benign   . Dyslipidemia   . Aortic stenosis     Severe - subsequent 21 mm Edwards pericardial valve on April 2014  . Coronary atherosclerosis of native coronary artery     Mild, catheterization, March, 2014, 30 and 40% lesions, nonobstructive, no further evaluation  . Ejection fraction     EF 60%, echo, March, 2014,  . Arthritis   . Atrial fibrillation     Perioperative atrial fibrillation, April, 2014, treated with amiodarone  . Hx of amiodarone therapy     Use for perioperative atrial fibrillation, April, 2014, the dose is being weaned over time May, 2014  . Pre-syncope     June, 2014  . Skin tenderness     Anterior chest in the area of the keloid    Past Surgical History  Procedure Laterality Date  . Appendectomy    . Right shoulder    . Shoulder surgery    . Breast surgery  bilateral  . Vaginal hysterectomy    . Total knee arthroplasty  07/01/2011    Procedure: TOTAL KNEE ARTHROPLASTY;  Surgeon: Fuller Canada, MD;  Location: [facility] ORS;  Service: Orthopedics;  Laterality: Right;  Depuy  . Colonoscopy  05/07/2012    Procedure: COLONOSCOPY;  Surgeon: Malissa Hippo, MD;  Location: [facility] SU[facility];  Service: Endoscopy;  Laterality: N/A;  200  . Tee without cardioversion N/A 11/26/2012    Procedure: TRANSESOPHAGEAL ECHOCARDIOGRAM (TEE);  Surgeon: Laurey Morale, MD;  Location: [facility] ENDOSCOPY;  Service: Cardiovascular;  Laterality: N/A;  . Eye surgery Right     glaucoma  . Aortic valve replacement N/A 12/25/2012    Procedure: AORTIC VALVE REPLACEMENT (AVR);  Surgeon: Alleen Borne, MD;  Location: [facility] OR;  Service: Open Heart Surgery;  Laterality: N/A;  . Intraoperative  transesophageal echocardiogram N/A 12/25/2012    Procedure: INTRAOPERATIVE TRANSESOPHAGEAL ECHOCARDIOGRAM;  Surgeon: Alleen Borne, MD;  Location: [facility] OR;  Service: Open Heart Surgery;  Laterality: N/A;  Patient Active Problem List   Diagnosis Date Noted  . Vertigo 11/16/2013  . Diabetes mellitus type 2, diet-controlled 11/09/2013  . Exertional chest pain 11/08/2013  . Dyspnea on exertion 11/08/2013  . Chest wall injury 07/02/2013  . Osteoarthritis of right knee 05/18/2013  . Radicular syndrome of right leg 05/18/2013  . Tendinitis of knee 04/22/2013  . Trigger thumb of right hand 04/22/2013  . Pre-syncope- negative Event June 2014   . Skin tenderness   . Atrial fibrillation   . Hx of amiodarone therapy   . Status post aortic valve replacement   . Dyslipidemia   . Aortic stenosis- tissue AVR April 2014   . Carotid artery disease   . CAD- mild at cath 3/14   . Dyspnea   . Ejection fraction   . Diverticulitis of sigmoid colon 08/10/2012  . Patellar tendonitis 07/08/2012  . Anemia 04/13/2012  . Spinal stenosis 11/13/2011  . Neuropathy of leg 10/02/2011  . DVT (deep venous thrombosis) 09/19/2011  . Bursitis of shoulder, right 08/15/2011  . Difficulty in walking 08/07/2011  . S/P total knee replacement 07/15/2011  . Arthritis of knee, right 06/12/2011  . Constipated 06/12/2011  . OA (osteoarthritis) of knee 04/11/2011  . HTN (hypertension)     ROS   Patient denies fever, chills, headache, sweats, rash, change in vision, change in hearing, chest pain, cough, nausea vomiting, urinary symptoms. All other systems are reviewed and are negative.  PHYSICAL EXAM  Patient is here with her husband. She is oriented to person time and place. Affect is normal. There is no jugulovenous distention. Lungs are clear. Respiratory effort is nonlabored. Cardiac exam reveals a soft murmur. The abdomen is soft. There is no peripheral edema.  Filed Vitals:   12/03/13 0940  BP: 156/90  Pulse:  64  Height: 5\' 3"  (1.6 m)  Weight: 164 lb 12.8 oz (74.753 kg)  SpO2: 100%     ASSESSMENT & PLAN

## 2013-12-03 NOTE — Assessment & Plan Note (Signed)
Coronary disease is stable. No change in therapy. 

## 2013-12-08 ENCOUNTER — Encounter: Payer: Self-pay | Admitting: Cardiology

## 2014-01-10 ENCOUNTER — Telehealth: Payer: Self-pay | Admitting: Orthopedic Surgery

## 2014-01-10 NOTE — Telephone Encounter (Signed)
Patient did not get to have the last ESI because when she was there she had shortness of breath and tingling in her left arm.  She was sent to the hospital by ambulance.  She wants to know if she can schedule another appointment for you to check her leg even [facility]   I have scheduled her for a thumb injection 01/13/14.   She said she will be moving to [place] the second week in May.

## 2014-01-10 NOTE — Telephone Encounter (Signed)
No note, error

## 2014-01-10 NOTE — Telephone Encounter (Signed)
Kelli ShutterElsie Stone did not get to have the last ESI ordered by you because she  had shortness of breath and tingling

## 2014-01-11 NOTE — Telephone Encounter (Signed)
DONE

## 2014-01-11 NOTE — Telephone Encounter (Signed)
Add leg eval to thumb appointment

## 2014-01-13 ENCOUNTER — Ambulatory Visit (INDEPENDENT_AMBULATORY_CARE_PROVIDER_SITE_OTHER): Payer: Medicare Other | Admitting: Orthopedic Surgery

## 2014-01-13 VITALS — BP 169/78 | Ht 63.0 in | Wt 164.0 lb

## 2014-01-13 DIAGNOSIS — M7631 Iliotibial band syndrome, right leg: Secondary | ICD-10-CM

## 2014-01-13 DIAGNOSIS — M65319 Trigger thumb, unspecified thumb: Secondary | ICD-10-CM

## 2014-01-13 DIAGNOSIS — M4800 Spinal stenosis, site unspecified: Secondary | ICD-10-CM

## 2014-01-13 DIAGNOSIS — M629 Disorder of muscle, unspecified: Secondary | ICD-10-CM

## 2014-01-13 DIAGNOSIS — M6530 Trigger finger, unspecified finger: Secondary | ICD-10-CM

## 2014-01-13 NOTE — Progress Notes (Signed)
Patient ID: Kelli Stone, female   DOB: 22-Jul-1931, 80 y.o.   MRN: [id number]  Chief Complaint  Patient presents with  . Hand Pain    Request Right thumb injection and leg evaluation    Reevaluate right thumb possible injection, reevaluate right leg for radicular pain related to spinal stenosis, last epidural canceled because of questionable cardiac event. This turned out to be vertigo.  Complains of pain over the lateral knee including tibial tibial band and also complains of radicular pain down her leg right side as well. She also complains of locking and catching of the right thumb with pain over the A1 pulley  Review of systems dizziness lightheadedness related to vertigo. No chest pain palpitations or murmurs. Denies tremors does complain of some dizziness and unsteady gait as well as numbness and tingling  Past Surgical History  Procedure Laterality Date  . Appendectomy    . Right shoulder    . Shoulder surgery    . Breast surgery  bilateral  . Vaginal hysterectomy    . Total knee arthroplasty  07/01/2011    Procedure: TOTAL KNEE ARTHROPLASTY;  Surgeon: Fuller CanadaStanley Harrison, MD;  Location: [facility] ORS;  Service: Orthopedics;  Laterality: Right;  Depuy  . Colonoscopy  05/07/2012    Procedure: COLONOSCOPY;  Surgeon: Malissa HippoNajeeb U Rehman, MD;  Location: [facility] ENDO SUITE;  Service: Endoscopy;  Laterality: N/A;  200  . Tee without cardioversion N/A 11/26/2012    Procedure: TRANSESOPHAGEAL ECHOCARDIOGRAM (TEE);  Surgeon: Laurey Moralealton S McLean, MD;  [facility];  Service: Cardiovascular;  Laterality: N/A;  . Eye surgery Right     glaucoma  . Aortic valve replacement N/A 12/25/2012    Procedure: AORTIC VALVE REPLACEMENT (AVR);  Surgeon: Alleen BorneBryan K Bartle, MD;  [facility];  Service: Open Heart Surgery;  Laterality: N/A;  . Intraoperative transesophageal echocardiogram N/A 12/25/2012    Procedure: INTRAOPERATIVE TRANSESOPHAGEAL ECHOCARDIOGRAM;  Surgeon: Alleen BorneBryan K Bartle, MD;  [facility];  Service:  Open Heart Surgery;  Laterality: N/A;   Vital signs:   General the patient is well-developed and well-nourished grooming and hygiene are normal Oriented x3 Mood and affect normal Ambulation normal  Inspection of the right leg shows tenderness over the lateral joint line and iliotibial band. The knee joint itself is nontender incision is clean dry and intact there is no knee swelling she has pain in the anterior compartment of the leg as well as the posterior part of the knee at the popliteal fossa. Knee remains stable in the medial lateral and anterior posterior planes. She's ablated with a slight limp. Motor exam is normal Skin clean dry and intact  Cardiovascular exam is normal  Right thumb is tender and swollen over the A1 pulley with painful range of motion  Encounter Diagnoses  Name Primary?  . Spinal stenosis Yes  . Trigger thumb   . Iliotibial band syndrome of right side     We recommended to injections one in the iliotibial band and 1 for the trigger thumb  We also reordered the epidural injection no contraindications at this point  She's moving to [place]  Procedure note trigger thumb injection  Diagnosis trigger finger Postop diagnosis trigger finger Procedure injection of trigger finger Finger injected RIGHT thumb Details of procedure: After verbal consent and timeout to confirm site the RIGHT thumb was injected with 1 cc of 40 mg of Depo-Medrol and 1 cc of 1% lidocaine  The procedure was tolerated well without complication  Procedure Injection right knee  for itb pain  knee Medications: Ethyl chloride for topical anesthetic. Lidocaine 1% 3 cc. 40 mg of Depo-Medrol per mL, 1 mL. Verbal consent. Timeout to confirm site of injection as right knee itb  knee Alcohol was used to clean the skin followed by ethyl chloride to anesthetize the skin. A direct  approach was used to inject the knee with lidocaine and Depo-Medrol.  No complications were noted. A sterile bandage  was applied.

## 2014-01-13 NOTE — Patient Instructions (Addendum)
You have received a steroid shot. 15% of patients experience increased pain at the injection site with in the next 24 hours. This is best treated with ice and tylenol extra strength 2 tabs every 8 hours. If you are still having pain please call the office.    

## 2014-03-21 ENCOUNTER — Ambulatory Visit (INDEPENDENT_AMBULATORY_CARE_PROVIDER_SITE_OTHER): Payer: Medicare Other | Admitting: Cardiology

## 2014-03-21 ENCOUNTER — Encounter: Payer: Self-pay | Admitting: Cardiology

## 2014-03-21 VITALS — BP 145/82 | HR 61 | Ht 63.0 in | Wt 164.0 lb

## 2014-03-21 DIAGNOSIS — I359 Nonrheumatic aortic valve disorder, unspecified: Secondary | ICD-10-CM

## 2014-03-21 DIAGNOSIS — I350 Nonrheumatic aortic (valve) stenosis: Secondary | ICD-10-CM

## 2014-03-21 DIAGNOSIS — I10 Essential (primary) hypertension: Secondary | ICD-10-CM

## 2014-03-21 NOTE — Assessment & Plan Note (Signed)
Blood pressures controlled. No change in therapy.  She tells me that she will be moving to [place]. She has a new physician I will put together a summary note that we can send to her new doctor.

## 2014-03-21 NOTE — Progress Notes (Signed)
Patient ID: Kelli Stone, female   DOB: Nov 05, 1930, 80 y.o.   MRN: [id number]    HPI  Patient is seen today to followup her aortic valve replacement. She's also seen to followup hypertension. She continues to have some mild dizziness but meclizine is helping with this. She has some mild exertional shortness of breath which is nonspecific. She does not have PND or orthopnea.  She tells me today that she and her husband will be moving to [place] to be closer to her son. We will send information as soon as we know who her new doctors will be  Allergies  Allergen Reactions  . Shellfish Allergy Nausea Only and Other (See Comments)    Vomiting & diarrhea  . Bonine [Meclizine Hcl]     Nervous   . Lotrel [Amlodipine Besy-Benazepril Hcl]     Made legs  Ache   . Benadryl [Diphenhydramine Hcl] Other (See Comments)    nervous  . Benadryl [Diphenhydramine] Other (See Comments)    Sleepy and nervousness  . Cephalexin     Rxn: unknown   . Lactose Intolerance (Gi) Diarrhea  . Other Other (See Comments)    GUIATUSSIN. Unknown reaction  . Sulfa Antibiotics Other (See Comments)    unknown  . Zocor [Simvastatin - High Dose]     Aching   . Celebrex [Celecoxib] Other (See Comments)    Chest discomfort  . Dilacor Xr [Diltiazem Hcl] Other (See Comments)    Rxn: unknown  . Erythromycin Nausea Only  . Lescol Other (See Comments)    aching  . Metformin And Related Diarrhea  . Methylprednisolone Acetate Itching and Other (See Comments)  . Naprosyn [Naproxen] Nausea Only  . Penicillins Other (See Comments)    sleepy  . Stadol [Butorphanol Tartrate] Other (See Comments)    numb  . Trinalin [Azatadine-Pseudoephedrine] Other (See Comments)    nervous    Current Outpatient Prescriptions  Medication Sig Dispense Refill  . acetaminophen (TYLENOL) 500 MG tablet Take 500-1,000 mg by mouth daily as needed (for leg pain).      Marland Kitchen. amLODipine (NORVASC) 2.5 MG tablet Take 1 tablet (2.5 mg total) by mouth  daily.  90 tablet  3  . aspirin EC 81 MG tablet Take 1 tablet (81 mg total) by mouth daily.  90 tablet  3  . azelastine (ASTELIN) 137 MCG/SPRAY nasal spray Place 1 spray into both nostrils daily. Use in each nostril as directed      . diazepam (VALIUM) 5 MG tablet Take 2.5-5 mg by mouth 3 (three) times daily as needed (for vertigo).       Marland Kitchen. ergocalciferol (VITAMIN D2) 50000 UNITS capsule Take 50,000 Units by mouth once a week. Take on wednesdays      . gabapentin (NEURONTIN) 100 MG capsule Take 100 mg by mouth daily as needed (for  pain).      Marland Kitchen. HYDROcodone-acetaminophen (NORCO) 7.5-325 MG per tablet Take 0.5 tablets by mouth as needed.      . latanoprost (XALATAN) 0.005 % ophthalmic solution Place 1 drop into both eyes daily.       Marland Kitchen. lisinopril (PRINIVIL,ZESTRIL) 40 MG tablet Take 40 mg by mouth daily.       . meclizine (ANTIVERT) 12.5 MG tablet Take 1 tablet (12.5 mg total) by mouth 3 (three) times daily as needed for dizziness.  30 tablet  0  . NASONEX 50 MCG/ACT nasal spray       . nitroGLYCERIN (NITROSTAT) 0.4 MG SL tablet Place  1 tablet (0.4 mg total) under the tongue every 5 (five) minutes x 3 doses as needed for chest pain (take one tablet under your tongue if needed fro chest pain, pressure, or tightness).  25 tablet  2  . pantoprazole (PROTONIX) 40 MG tablet Take 40 mg by mouth daily.      . timolol (TIMOPTIC) 0.5 % ophthalmic solution Place 1 drop into both eyes daily.       No current facility-administered medications for this visit.    History   Social History  . Marital Status: Married    Spouse Name: N/A    Number of Children: N/A  . Years of Education: 11th grade   Occupational History  . Not on file.   Social History Main Topics  . Smoking status: Former Smoker -- 0.20 packs/day for 15 years    Types: Cigarettes    Quit date: 09/17/1983  . Smokeless tobacco: Never Used  . Alcohol Use: No  . Drug Use: No  . Sexual Activity: No   Other Topics Concern  . Not on  file   Social History Narrative  . No narrative on file    Family History  Problem Relation Age of Onset  . Cancer    . Diabetes    . Diabetes Mother   . Bone cancer Father   . Diabetes Sister   . Kidney disease Sister   . Stomach cancer Sister   . Stroke Brother   . Bone cancer Brother   . Healthy Son     Past Medical History  Diagnosis Date  . Glaucoma   . GERD (gastroesophageal reflux disease)   . Vertigo   . Type 2 diabetes mellitus     Diet controlled  . Essential hypertension, benign   . Dyslipidemia   . Aortic stenosis     Severe - subsequent 21 mm Edwards pericardial valve on April 2014  . Coronary atherosclerosis of native coronary artery     Mild, catheterization, March, 2014, 30 and 40% lesions, nonobstructive, no further evaluation  . Ejection fraction     EF 60%, echo, March, 2014,  . Arthritis   . Atrial fibrillation     Perioperative atrial fibrillation, April, 2014, treated with amiodarone  . Hx of amiodarone therapy     Use for perioperative atrial fibrillation, April, 2014, the dose is being weaned over time May, 2014  . Pre-syncope     June, 2014  . Skin tenderness     Anterior chest in the area of the keloid    Past Surgical History  Procedure Laterality Date  . Appendectomy    . Right shoulder    . Shoulder surgery    . Breast surgery  bilateral  . Vaginal hysterectomy    . Total knee arthroplasty  07/01/2011    Procedure: TOTAL KNEE ARTHROPLASTY;  Surgeon: Fuller Canada, MD;  Location: [facility] ORS;  Service: Orthopedics;  Laterality: Right;  Depuy  . Colonoscopy  05/07/2012    Procedure: COLONOSCOPY;  Surgeon: Malissa Hippo, MD;  Location: [facility] ENDO SUITE;  Service: Endoscopy;  Laterality: N/A;  200  . Tee without cardioversion N/A 11/26/2012    Procedure: TRANSESOPHAGEAL ECHOCARDIOGRAM (TEE);  Surgeon: Laurey Morale, MD;  Location: [facility] ENDOSCOPY;  Service: Cardiovascular;  Laterality: N/A;  . Eye surgery Right     glaucoma  . Aortic  valve replacement N/A 12/25/2012    Procedure: AORTIC VALVE REPLACEMENT (AVR);  Surgeon: Alleen Borne, MD;  Location: [facility]  OR;  Service: Open Heart Surgery;  Laterality: N/A;  . Intraoperative transesophageal echocardiogram N/A 12/25/2012    Procedure: INTRAOPERATIVE TRANSESOPHAGEAL ECHOCARDIOGRAM;  Surgeon: Alleen BorneBryan K Bartle, MD;  [facility];  Service: Open Heart Surgery;  Laterality: N/A;    Patient Active Problem List   Diagnosis Date Noted  . Iliotibial band syndrome of right side 01/13/2014  . Trigger thumb 01/13/2014  . Vertigo 11/16/2013  . Diabetes mellitus type 2, diet-controlled 11/09/2013  . Exertional chest pain 11/08/2013  . Dyspnea on exertion 11/08/2013  . Chest wall injury 07/02/2013  . Osteoarthritis of right knee 05/18/2013  . Radicular syndrome of right leg 05/18/2013  . Tendinitis of knee 04/22/2013  . Trigger thumb of right hand 04/22/2013  . Pre-syncope- negative Event June 2014   . Skin tenderness   . Atrial fibrillation   . Hx of amiodarone therapy   . Status post aortic valve replacement   . Dyslipidemia   . Aortic stenosis- tissue AVR April 2014   . Carotid artery disease   . CAD- mild at cath 3/14   . Dyspnea   . Ejection fraction   . Diverticulitis of sigmoid colon 08/10/2012  . Patellar tendonitis 07/08/2012  . Anemia 04/13/2012  . Spinal stenosis 11/13/2011  . Neuropathy of leg 10/02/2011  . DVT (deep venous thrombosis) 09/19/2011  . Bursitis of shoulder, right 08/15/2011  . Difficulty in walking 08/07/2011  . S/P total knee replacement 07/15/2011  . Arthritis of knee, right 06/12/2011  . Constipated 06/12/2011  . OA (osteoarthritis) of knee 04/11/2011  . HTN (hypertension)     ROS   Patient denies fever, chills, headache, sweats, rash, change in vision, change in hearing, chest pain, cough, nausea or vomiting, urinary symptoms. All other systems are reviewed and are negative.  PHYSICAL EXAM  Patient is oriented to person time and place.  Affect is normal. She's here with her husband. Head is atraumatic. Sclera and conjunctiva are normal. There is no jugulovenous distention. Lungs are clear. Respiratory effort is nonlabored. Cardiac exam reveals an S1 and S2. There is a soft outflow murmur from the prosthesis. The abdomen is soft. There is no significant peripheral edema.  Filed Vitals:   03/21/14 1145  BP: 145/82  Pulse: 61  Height: 5\' 3"  (1.6 m)  Weight: 164 lb (74.39 kg)     ASSESSMENT & PLAN

## 2014-03-21 NOTE — Patient Instructions (Signed)
Your physician recommends that you continue on your current medications as directed. Please refer to the Current Medication list given to you today. Please follow up with your new cardiologist in [place]. Your records will be available upon request.

## 2014-03-21 NOTE — Assessment & Plan Note (Signed)
She is stable after her aortic valve replacement April, 2014. Echo in February, 2015 showed that her valve is working well.

## 2014-08-06 IMAGING — CR DG KNEE AP/LAT W/ SUNRISE*R*
3 series · 3 of 3 positions shown · non-contrast
Comparison: 05/04/2012

CLINICAL DATA: Knee pain.

DG KNEE - 3 VIEWS

[view not recorded (1 of 3)]
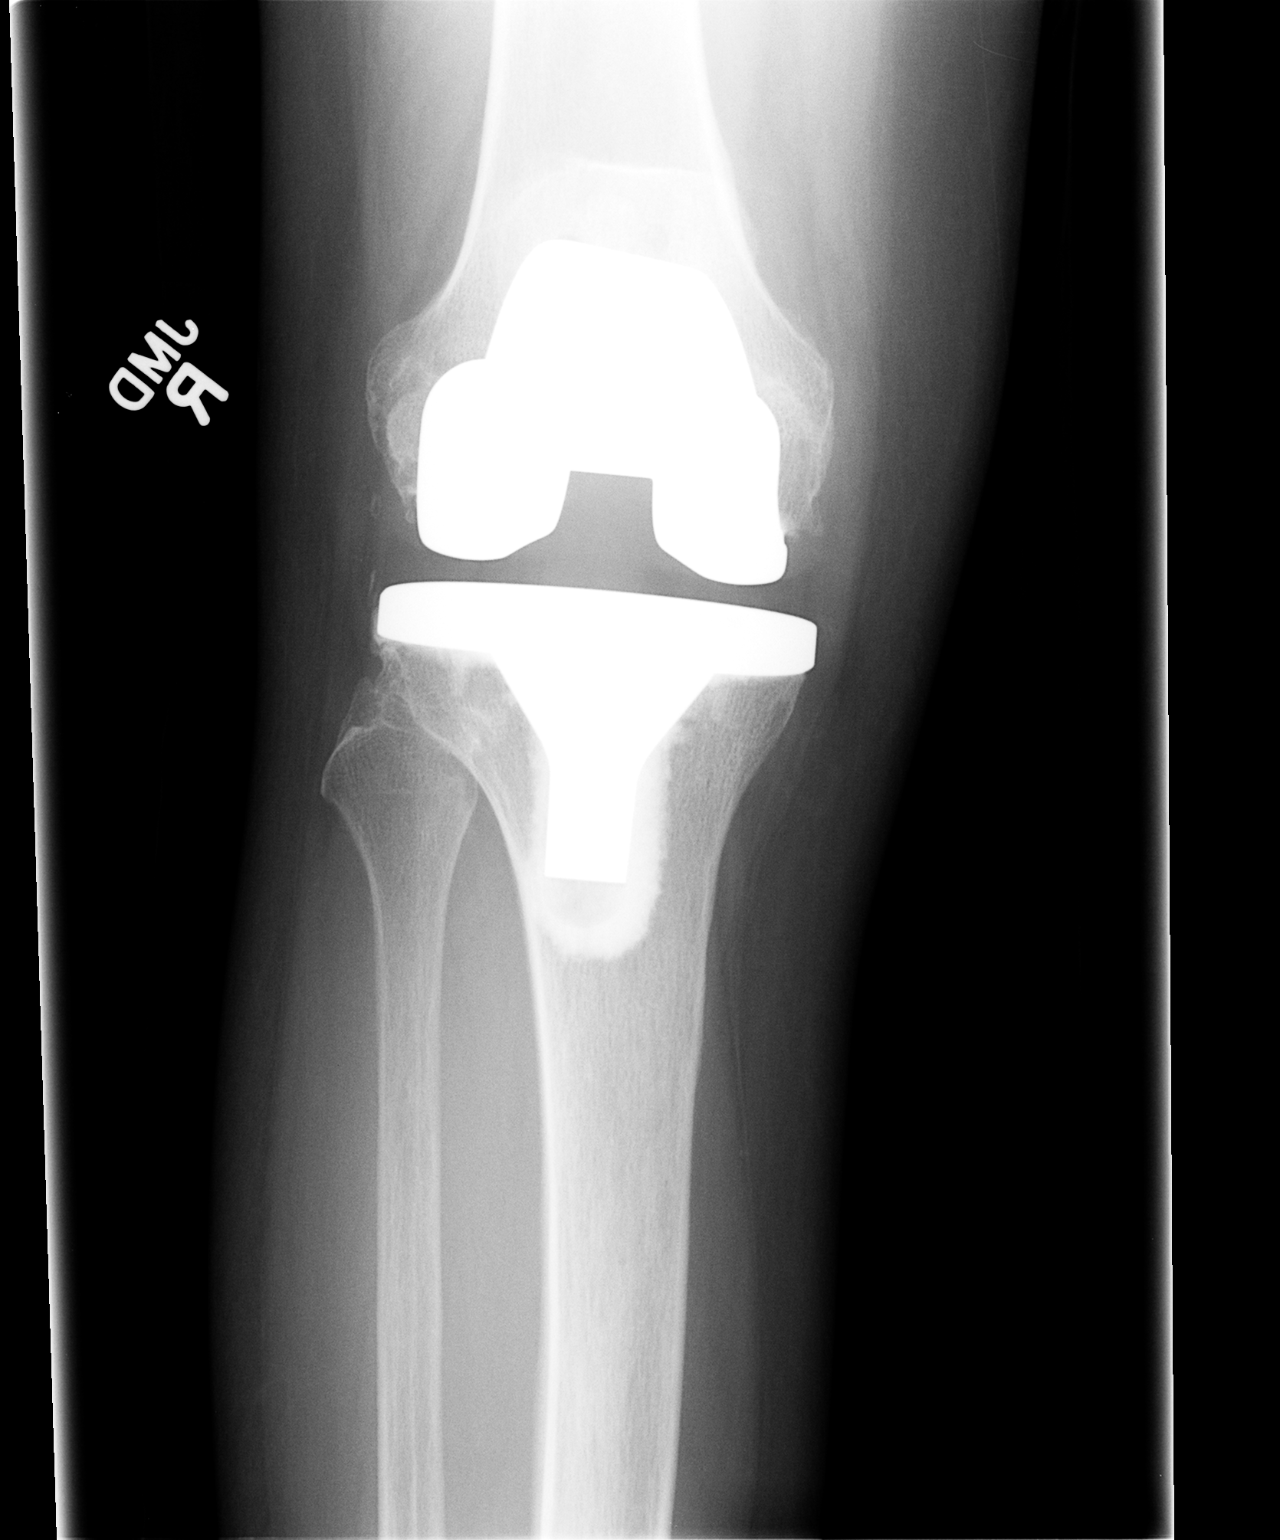

[view not recorded (2 of 3)]
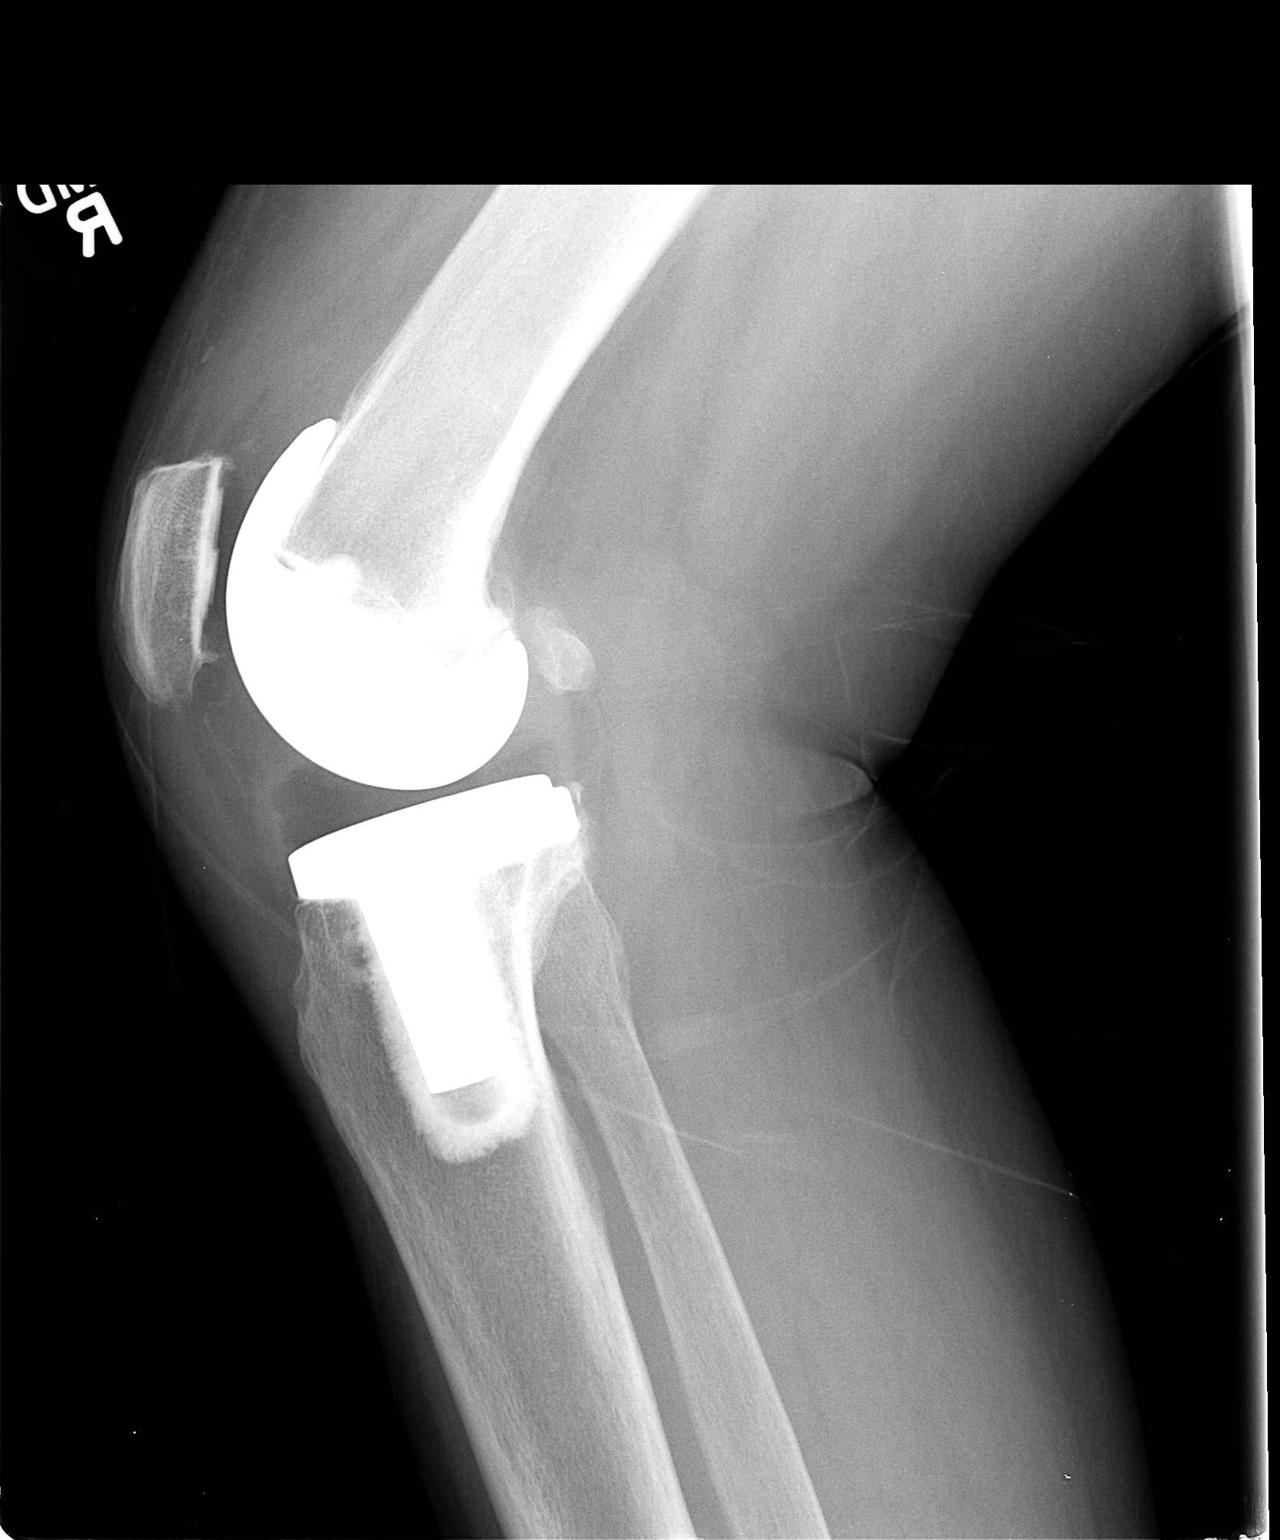

[view not recorded (3 of 3)]
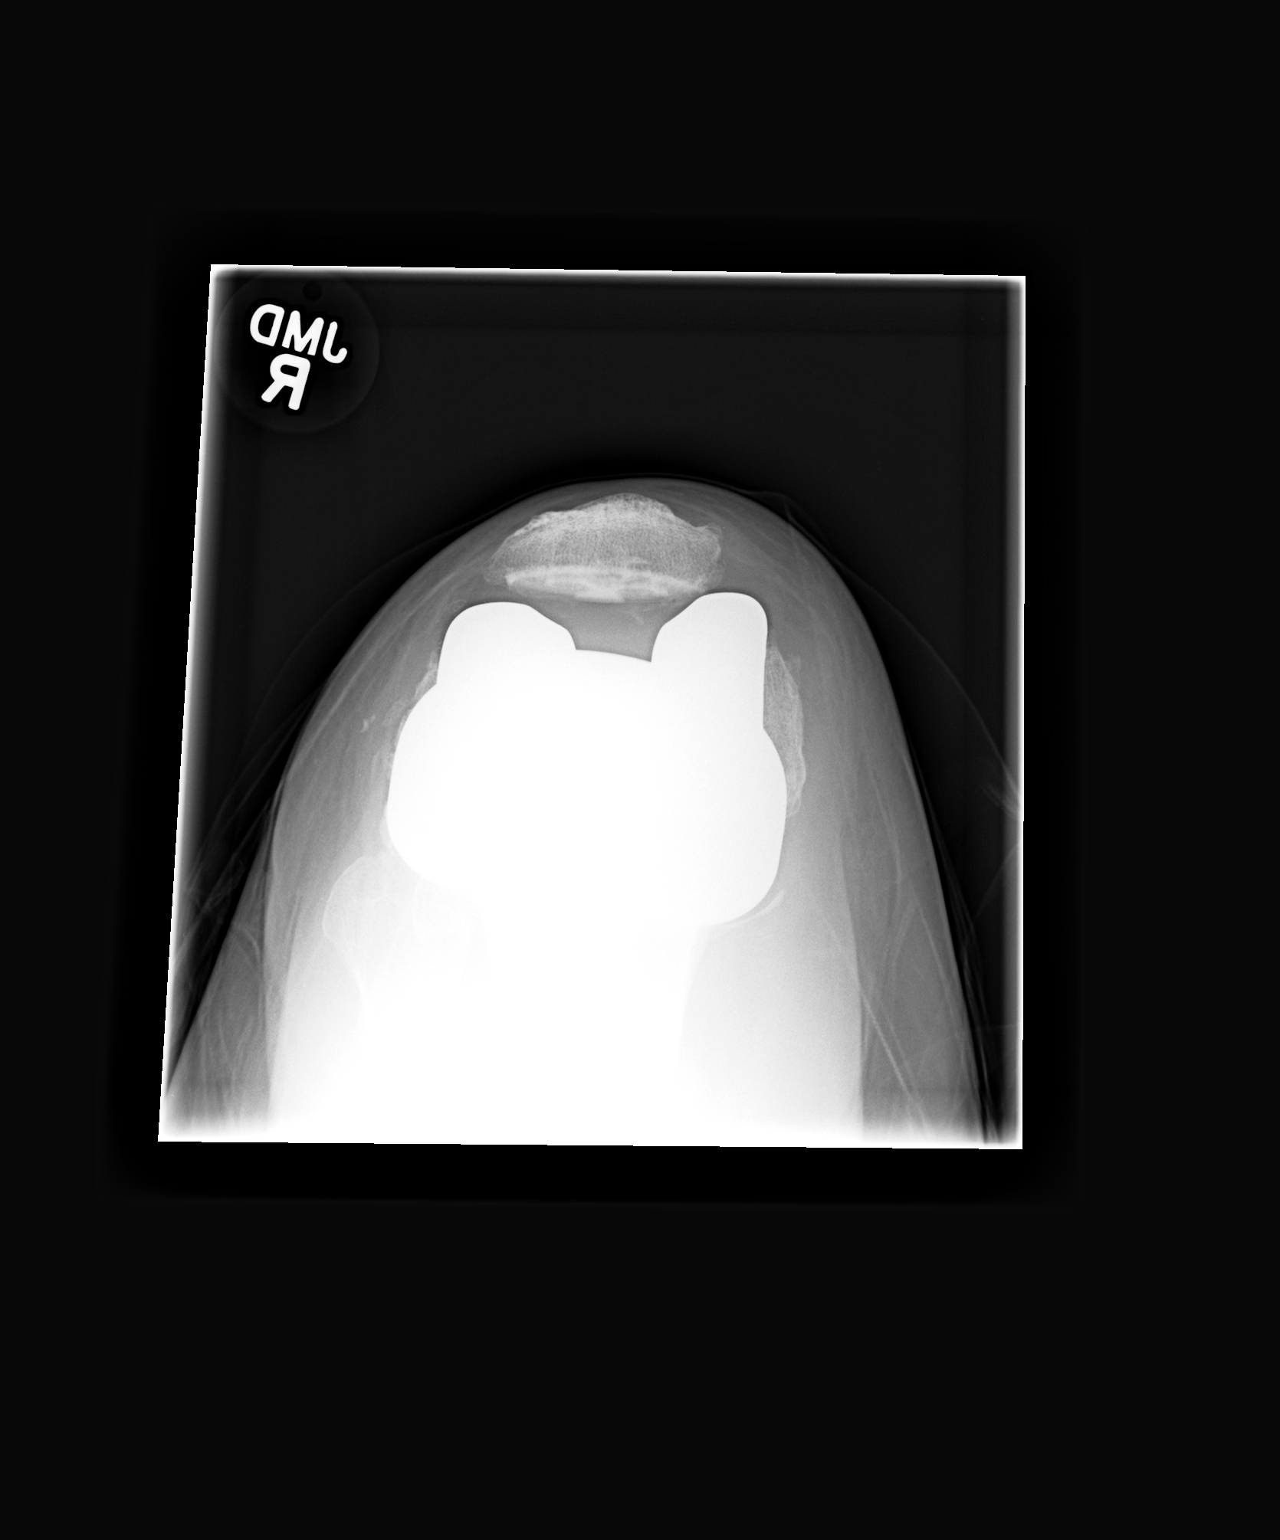

[3 of 3 positions shown; findings below may reference images not displayed]

FINDINGS: Changes of right knee replacement.  No hardware or bony
complicating feature.  There may be a small joint effusion present.
No fracture, subluxation or dislocation.
IMPRESSION: Changes of right knee replacement.  Question small joint effusion.
No acute bony abnormality.

## 2014-08-25 ENCOUNTER — Encounter (HOSPITAL_COMMUNITY): Payer: Self-pay | Admitting: Cardiovascular Disease

## 2014-09-29 IMAGING — CR DG CHEST 1V PORT
1 series · 1 of 1 positions shown · non-contrast
Comparison: Portable chest x-rays yesterday and 12/25/2012.

CLINICAL DATA: Postop aortic valve replacement.

PORTABLE CHEST - 1 VIEW [DATE]/[id number] hours:

[AP]
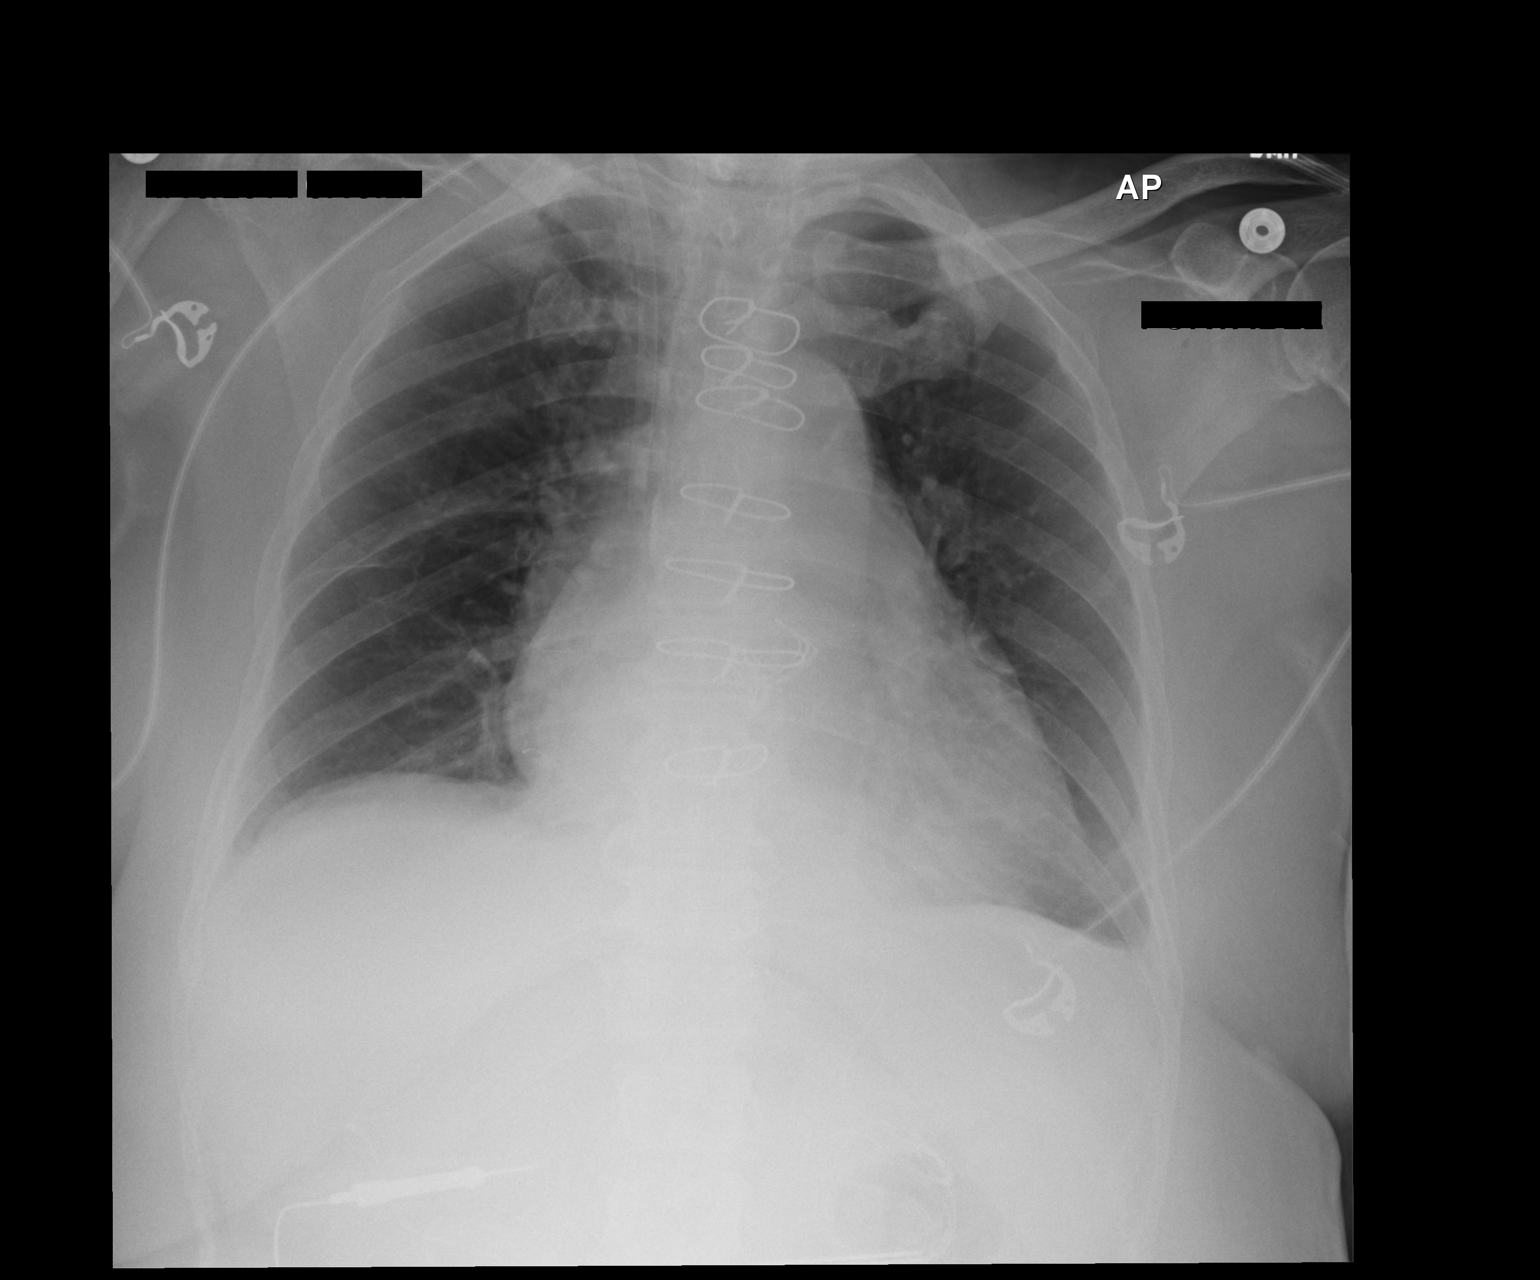

[1 of 1 positions shown; findings below may reference images not displayed]

FINDINGS: Interval Swan-Ganz catheter and mediastinal drainage tube
removal.  Right jugular introducer sheath tip remains in the upper
SVC.  Sternotomy for aortic valve replacement.  Heart mildly
enlarged but stable.  Mild bibasilar atelectasis, left greater than
right, with improved aeration since yesterday.  Small bilateral
effusions, also improved.  No new pulmonary parenchymal
abnormalities.
IMPRESSION: Mild bibasilar atelectasis, left greater than right, with improved
aeration since yesterday.  Small bilateral effusions, also
improved.  No new abnormalities.
